# Patient Record
Sex: Female | Born: 1975 | Race: White | Hispanic: No | Marital: Married | Smoking: Never smoker
Health system: Southern US, Community
[De-identification: ages and names within clinical notes are randomized; demographics above are authoritative.]

## PROBLEM LIST (undated history)

## (undated) DIAGNOSIS — E039 Hypothyroidism, unspecified: Secondary | ICD-10-CM

## (undated) DIAGNOSIS — T7840XA Allergy, unspecified, initial encounter: Secondary | ICD-10-CM

## (undated) DIAGNOSIS — N12 Tubulo-interstitial nephritis, not specified as acute or chronic: Secondary | ICD-10-CM

## (undated) DIAGNOSIS — R945 Abnormal results of liver function studies: Secondary | ICD-10-CM

## (undated) HISTORY — DX: Tubulo-interstitial nephritis, not specified as acute or chronic: N12

## (undated) HISTORY — DX: Abnormal results of liver function studies: R94.5

## (undated) HISTORY — DX: Allergy, unspecified, initial encounter: T78.40XA

## (undated) HISTORY — DX: Hypothyroidism, unspecified: E03.9

---

## 1998-05-24 ENCOUNTER — Other Ambulatory Visit: Admission: RE | Admit: 1998-05-24 | Discharge: 1998-05-24 | Payer: Self-pay | Admitting: Family Medicine

## 1998-09-19 ENCOUNTER — Other Ambulatory Visit: Admission: RE | Admit: 1998-09-19 | Discharge: 1998-09-19 | Payer: Self-pay | Admitting: Family Medicine

## 1998-11-02 ENCOUNTER — Other Ambulatory Visit: Admission: RE | Admit: 1998-11-02 | Discharge: 1998-11-02 | Payer: Self-pay | Admitting: Obstetrics and Gynecology

## 1999-02-06 ENCOUNTER — Other Ambulatory Visit: Admission: RE | Admit: 1999-02-06 | Discharge: 1999-02-06 | Payer: Self-pay | Admitting: Family Medicine

## 1999-07-03 ENCOUNTER — Other Ambulatory Visit: Admission: RE | Admit: 1999-07-03 | Discharge: 1999-07-03 | Payer: Self-pay | Admitting: Family Medicine

## 1999-07-21 ENCOUNTER — Encounter: Payer: Self-pay | Admitting: Emergency Medicine

## 1999-07-21 ENCOUNTER — Emergency Department (HOSPITAL_COMMUNITY): Admission: EM | Admit: 1999-07-21 | Discharge: 1999-07-21 | Payer: Self-pay | Admitting: Emergency Medicine

## 2000-02-14 ENCOUNTER — Other Ambulatory Visit: Admission: RE | Admit: 2000-02-14 | Discharge: 2000-02-14 | Payer: Self-pay | Admitting: Family Medicine

## 2000-05-31 ENCOUNTER — Emergency Department (HOSPITAL_COMMUNITY): Admission: EM | Admit: 2000-05-31 | Discharge: 2000-05-31 | Payer: Self-pay | Admitting: Emergency Medicine

## 2001-03-24 ENCOUNTER — Other Ambulatory Visit: Admission: RE | Admit: 2001-03-24 | Discharge: 2001-03-24 | Payer: Self-pay | Admitting: Obstetrics and Gynecology

## 2001-05-13 ENCOUNTER — Encounter: Payer: Self-pay | Admitting: Obstetrics and Gynecology

## 2001-05-13 ENCOUNTER — Ambulatory Visit (HOSPITAL_COMMUNITY): Admission: RE | Admit: 2001-05-13 | Discharge: 2001-05-13 | Payer: Self-pay | Admitting: Obstetrics and Gynecology

## 2001-06-17 ENCOUNTER — Ambulatory Visit (HOSPITAL_COMMUNITY): Admission: RE | Admit: 2001-06-17 | Discharge: 2001-06-17 | Payer: Self-pay | Admitting: Obstetrics and Gynecology

## 2001-06-17 ENCOUNTER — Encounter: Payer: Self-pay | Admitting: Obstetrics and Gynecology

## 2001-08-19 ENCOUNTER — Ambulatory Visit (HOSPITAL_COMMUNITY): Admission: RE | Admit: 2001-08-19 | Discharge: 2001-08-19 | Payer: Self-pay | Admitting: Obstetrics and Gynecology

## 2001-08-19 ENCOUNTER — Encounter: Payer: Self-pay | Admitting: Obstetrics and Gynecology

## 2001-08-19 ENCOUNTER — Inpatient Hospital Stay (HOSPITAL_COMMUNITY): Admission: AD | Admit: 2001-08-19 | Discharge: 2001-08-19 | Payer: Self-pay | Admitting: Obstetrics and Gynecology

## 2001-09-04 ENCOUNTER — Inpatient Hospital Stay (HOSPITAL_COMMUNITY): Admission: AD | Admit: 2001-09-04 | Discharge: 2001-09-06 | Payer: Self-pay | Admitting: Obstetrics and Gynecology

## 2001-09-04 ENCOUNTER — Encounter (INDEPENDENT_AMBULATORY_CARE_PROVIDER_SITE_OTHER): Payer: Self-pay | Admitting: Specialist

## 2001-09-07 ENCOUNTER — Encounter: Admission: RE | Admit: 2001-09-07 | Discharge: 2001-10-07 | Payer: Self-pay | Admitting: Obstetrics and Gynecology

## 2002-03-28 ENCOUNTER — Other Ambulatory Visit: Admission: RE | Admit: 2002-03-28 | Discharge: 2002-03-28 | Payer: Self-pay | Admitting: Obstetrics and Gynecology

## 2002-04-01 ENCOUNTER — Encounter: Payer: Self-pay | Admitting: Family Medicine

## 2002-04-01 ENCOUNTER — Ambulatory Visit (HOSPITAL_COMMUNITY): Admission: RE | Admit: 2002-04-01 | Discharge: 2002-04-01 | Payer: Self-pay | Admitting: Nurse Practitioner

## 2002-11-29 ENCOUNTER — Ambulatory Visit (HOSPITAL_COMMUNITY): Admission: RE | Admit: 2002-11-29 | Discharge: 2002-11-29 | Payer: Self-pay | Admitting: Family Medicine

## 2002-11-29 ENCOUNTER — Encounter: Payer: Self-pay | Admitting: Family Medicine

## 2003-04-12 ENCOUNTER — Other Ambulatory Visit: Admission: RE | Admit: 2003-04-12 | Discharge: 2003-04-12 | Payer: Self-pay | Admitting: Obstetrics and Gynecology

## 2003-04-12 ENCOUNTER — Other Ambulatory Visit: Admission: RE | Admit: 2003-04-12 | Discharge: 2003-04-12 | Payer: Self-pay | Admitting: Internal Medicine

## 2004-05-22 ENCOUNTER — Ambulatory Visit: Payer: Self-pay | Admitting: Endocrinology

## 2004-06-18 ENCOUNTER — Other Ambulatory Visit: Admission: RE | Admit: 2004-06-18 | Discharge: 2004-06-18 | Payer: Self-pay | Admitting: Obstetrics and Gynecology

## 2004-08-14 ENCOUNTER — Encounter: Admission: RE | Admit: 2004-08-14 | Discharge: 2004-08-14 | Payer: Self-pay | Admitting: Obstetrics and Gynecology

## 2005-08-09 ENCOUNTER — Inpatient Hospital Stay (HOSPITAL_COMMUNITY): Admission: AD | Admit: 2005-08-09 | Discharge: 2005-08-09 | Payer: Self-pay | Admitting: Obstetrics and Gynecology

## 2005-08-09 IMAGING — US US OB TRANSVAGINAL MODIFY
1 series · 14 of 25 positions shown · non-contrast
Comparison: none

HISTORY: Early pregnancy, vaginal bleeding

[Series 1: us ob transvaginal modify · 0.39mm/px · 14 of 25 slices shown]
[im 1/25]
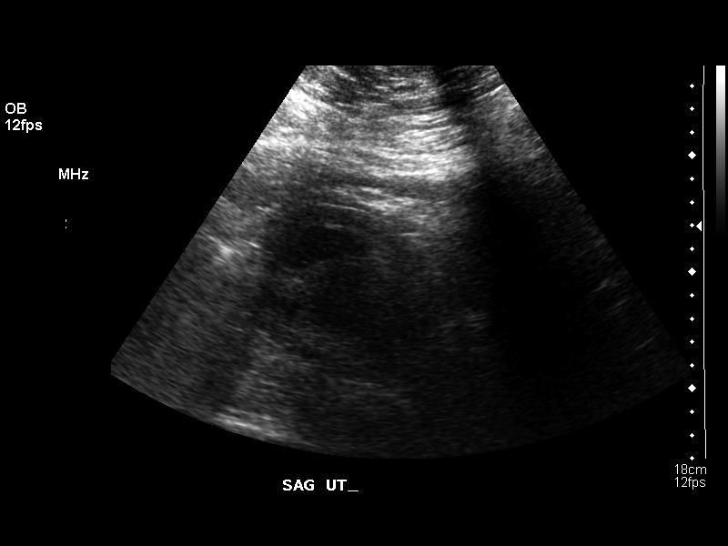
[im 3/25]
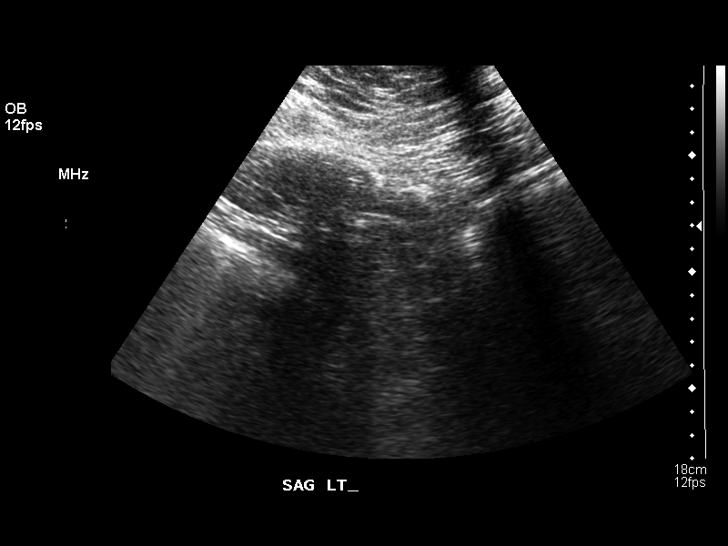
[im 5/25]
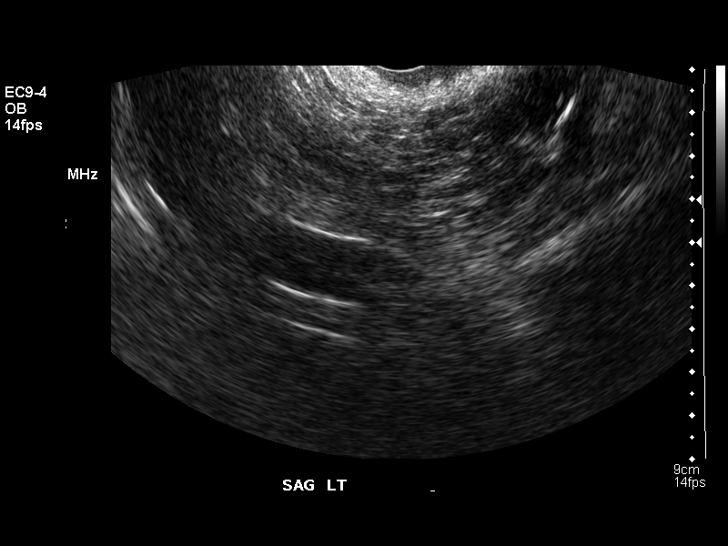
[im 7/25]
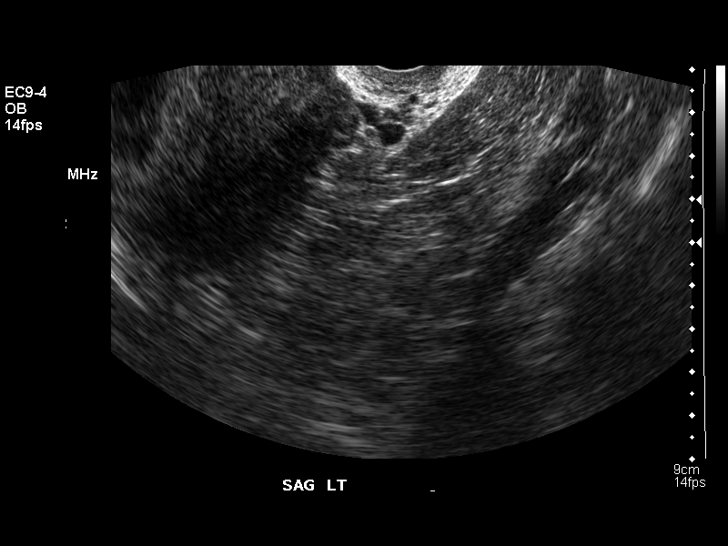
[im 9/25]
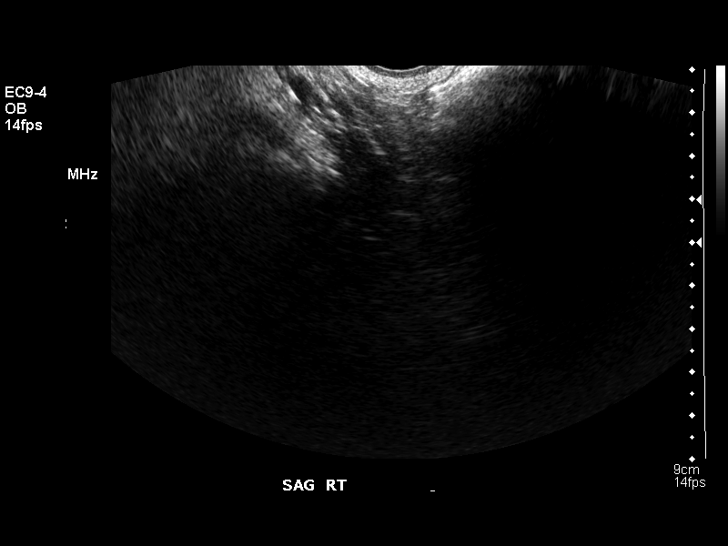
[im 10/25]
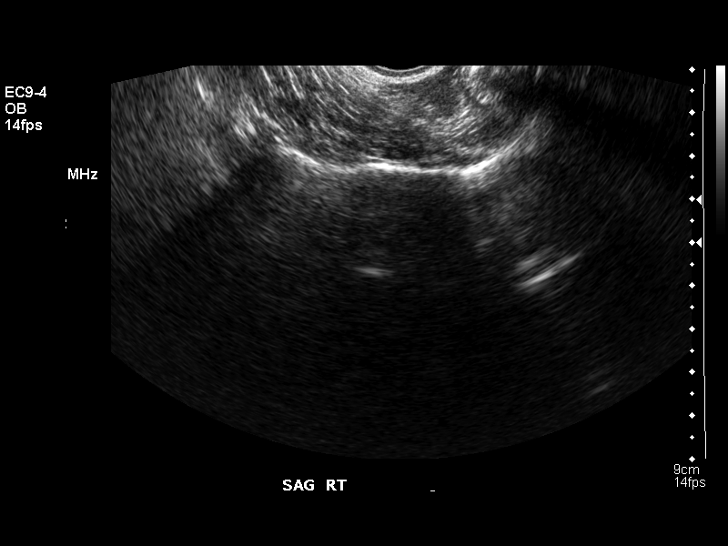
[im 12/25]
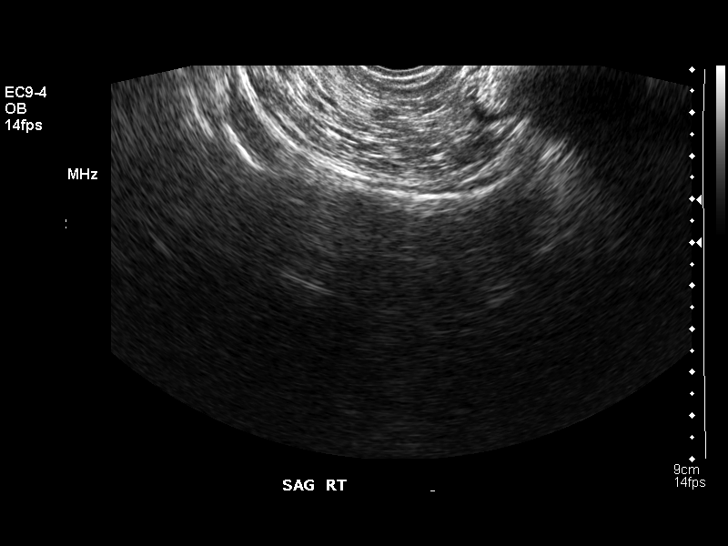
[im 14/25]
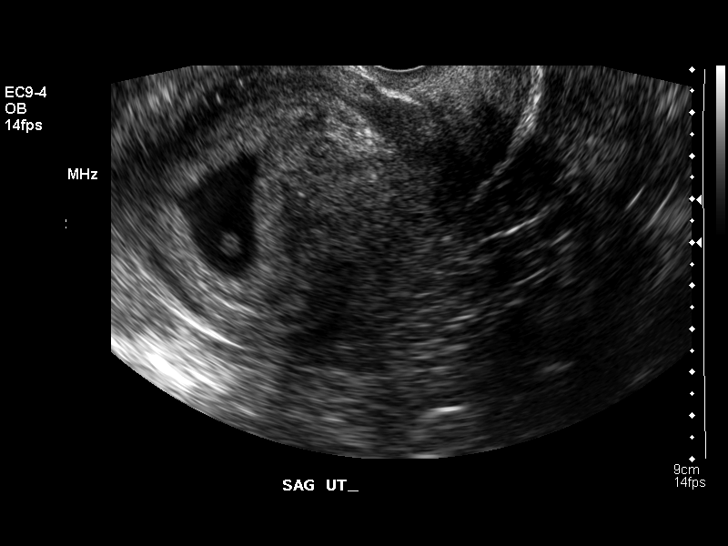
[im 16/25]
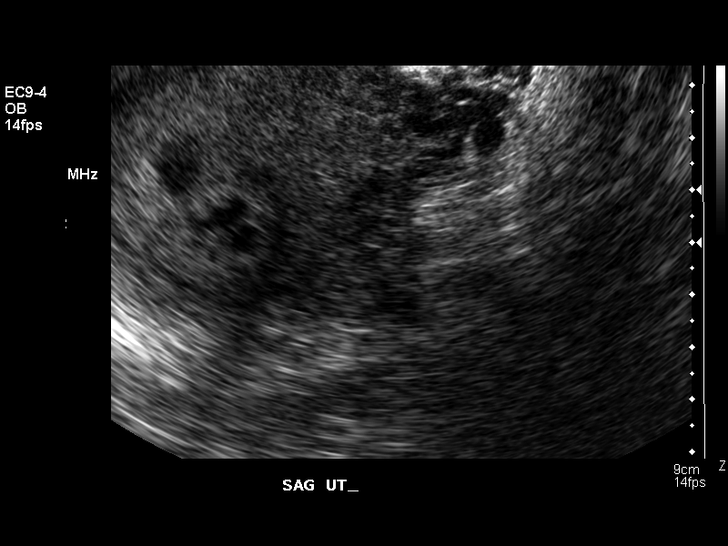
[im 17/25]
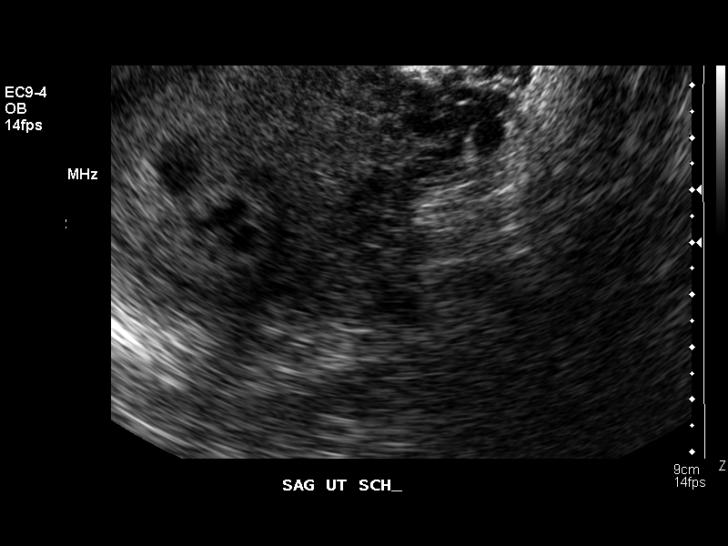
[im 19/25]
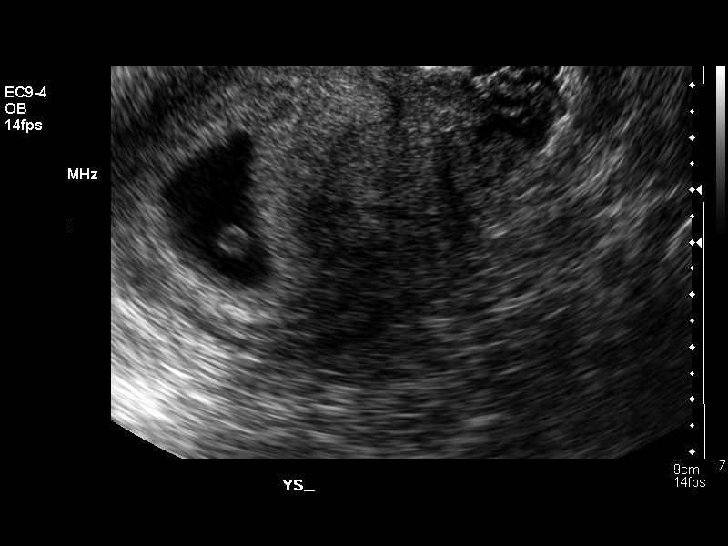
[im 21/25]
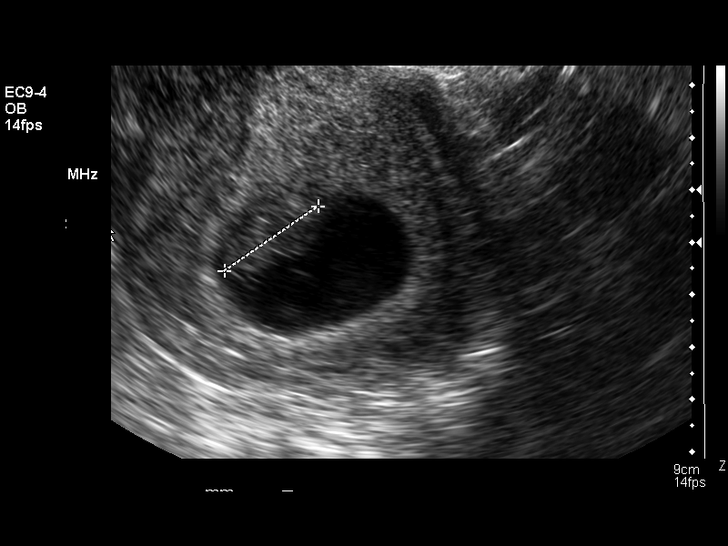
[im 23/25]
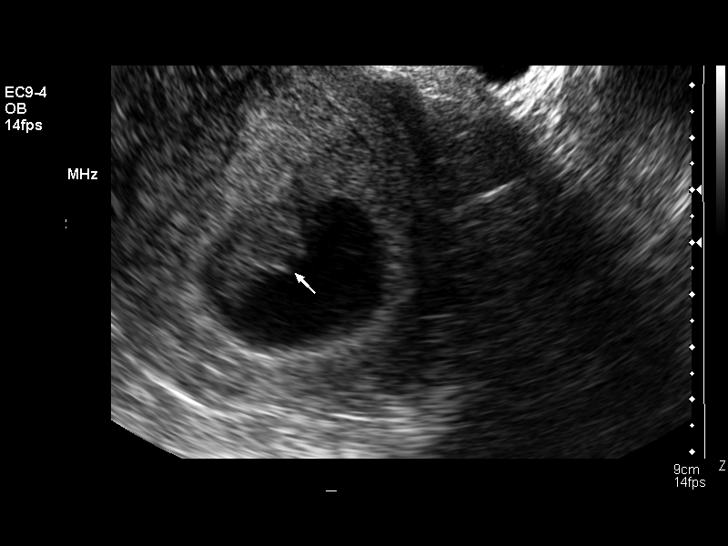
[im 25/25]
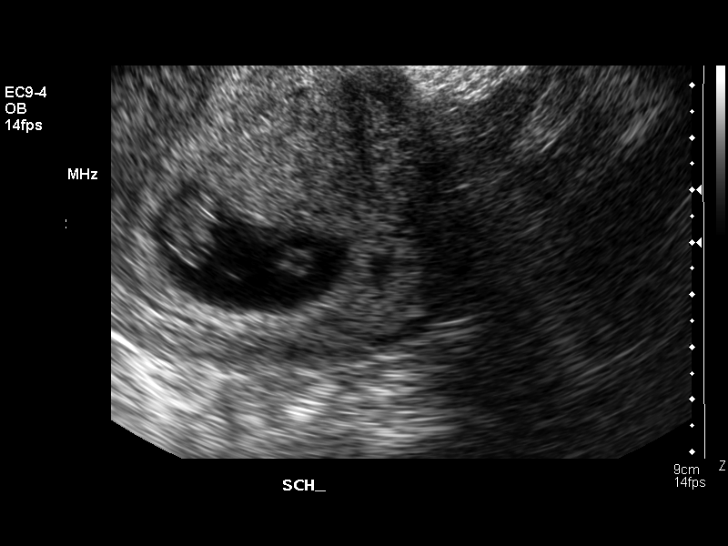

[14 of 25 positions shown; findings below may reference images not displayed]

ULTRASOUND OB COMPLETE LESS THAN 14 WEEKS:
ULTRASOUND OB TRANSVAGINAL MODIFY:

Transabdominal and endovaginal sonography of the gravid pelvis performed.

Transabdominal imaging limited by lack of adequate bladder distention and poor
acoustic window.
Gestational sac identified within uterus, containing small fetus.
Crown-rump length 2.2 cm corresponding to 8 weeks 6 days estimated gestational
age.
Small subchorionic hemorrhage identified.
Fetal cardiac activity detected at 160 beats per minute.
Yolk sac identified.
Ovaries not visualized.
No free pelvic fluid.
IMPRESSION: Early single live intrauterine gestation measured at 8 weeks 6 days estimated
gestational age.
Small subchorionic hemorrhage.

## 2005-09-03 ENCOUNTER — Other Ambulatory Visit: Admission: RE | Admit: 2005-09-03 | Discharge: 2005-09-03 | Payer: Self-pay | Admitting: Obstetrics and Gynecology

## 2006-02-25 ENCOUNTER — Inpatient Hospital Stay (HOSPITAL_COMMUNITY): Admission: AD | Admit: 2006-02-25 | Discharge: 2006-02-27 | Payer: Self-pay | Admitting: Obstetrics and Gynecology

## 2006-02-25 ENCOUNTER — Encounter (INDEPENDENT_AMBULATORY_CARE_PROVIDER_SITE_OTHER): Payer: Self-pay | Admitting: Specialist

## 2006-02-28 ENCOUNTER — Encounter: Admission: RE | Admit: 2006-02-28 | Discharge: 2006-03-29 | Payer: Self-pay | Admitting: Obstetrics and Gynecology

## 2006-03-30 ENCOUNTER — Encounter: Admission: RE | Admit: 2006-03-30 | Discharge: 2006-04-29 | Payer: Self-pay | Admitting: Obstetrics and Gynecology

## 2006-12-20 ENCOUNTER — Emergency Department (HOSPITAL_COMMUNITY): Admission: EM | Admit: 2006-12-20 | Discharge: 2006-12-21 | Payer: Self-pay | Admitting: Emergency Medicine

## 2006-12-23 ENCOUNTER — Emergency Department (HOSPITAL_COMMUNITY): Admission: EM | Admit: 2006-12-23 | Discharge: 2006-12-23 | Payer: Self-pay | Admitting: Emergency Medicine

## 2006-12-23 IMAGING — CT CT HEAD W/O CM
1 series · 16 of 30 positions shown, 20 images · IV contrast (agent unspecified)
Comparison: None.

CLINICAL DATA: Headache/fever.  
 HEAD CT WITHOUT CONTRAST:
TECHNIQUE: Contiguous axial images were obtained from the base of the skull through the vertex according to standard protocol without contrast.

[Series 2: headseq 4.8 h37s · axial · 0.44mm/px · z∈[+72,+232]mm · 16 of 36 slices shown, 20 images]
[im 2/36  brain]
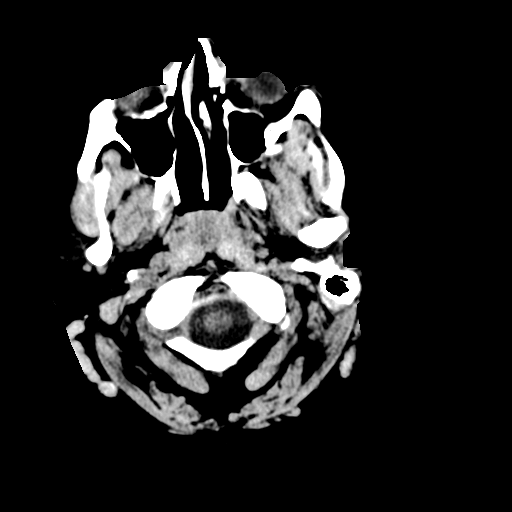
[im 2/36  bone]
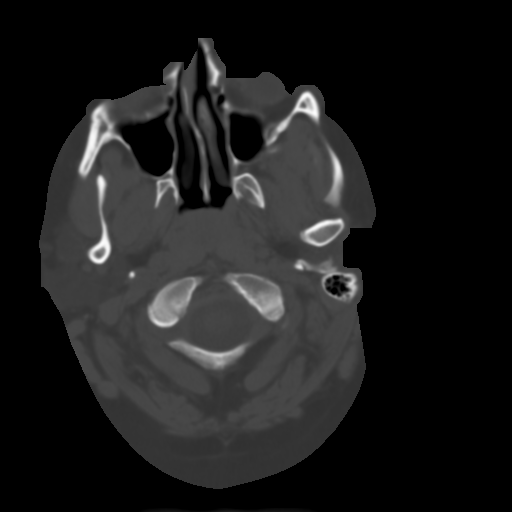
[im 4/36  brain]
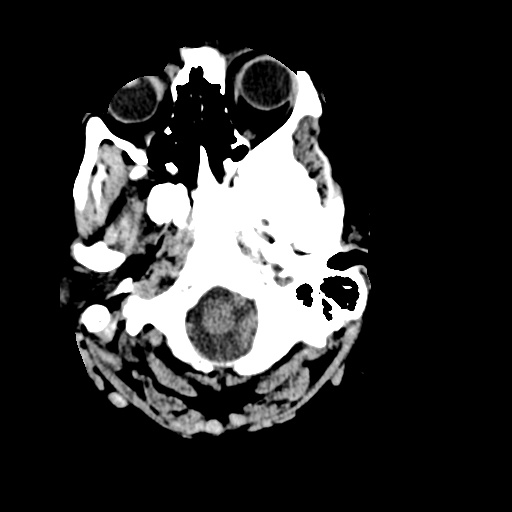
[im 7/36  brain]
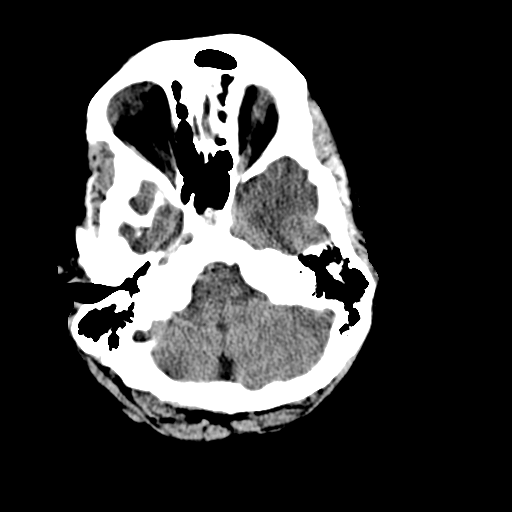
[im 9/36  brain]
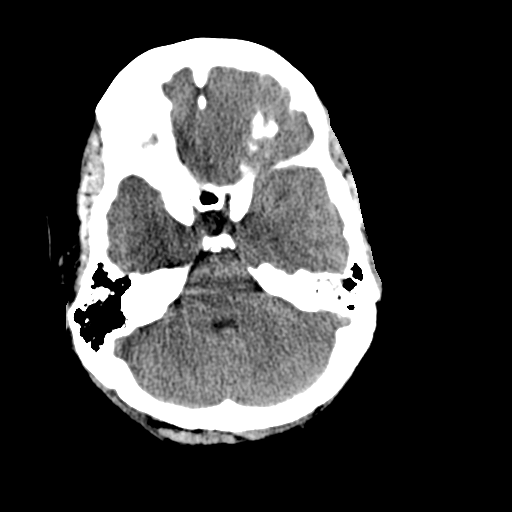
[im 10/36  brain]
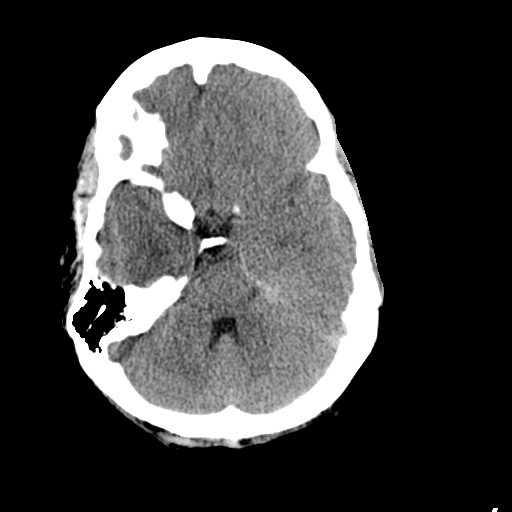
[im 10/36  bone]
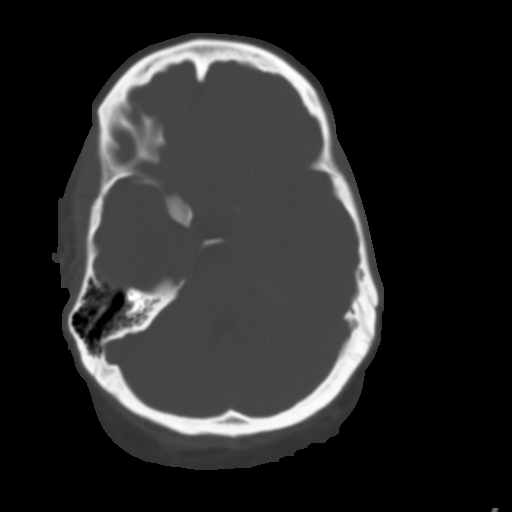
[im 13/36  brain]
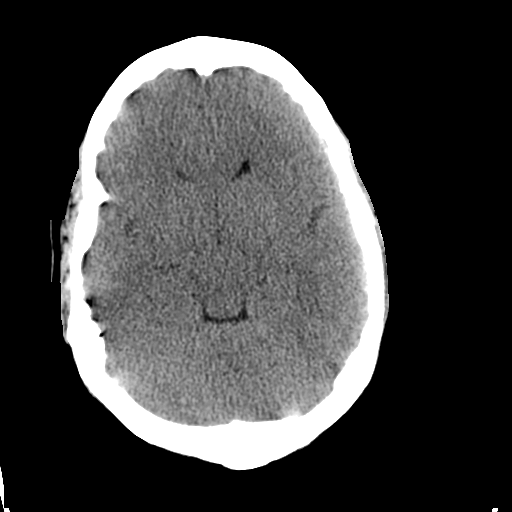
[im 15/36  brain]
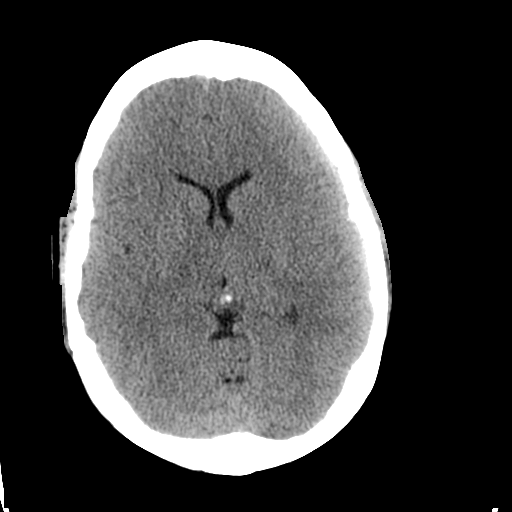
[im 17/36  brain]
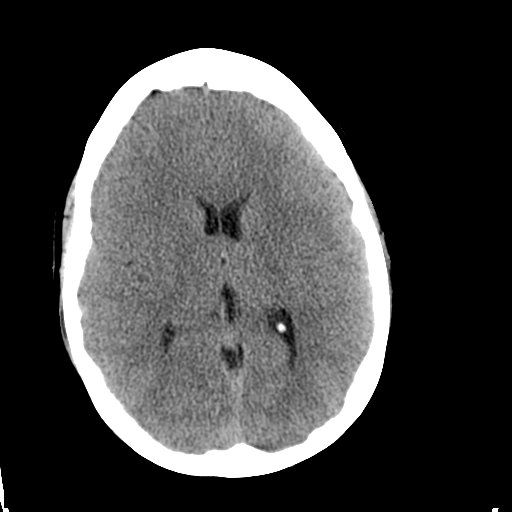
[im 19/36  brain]
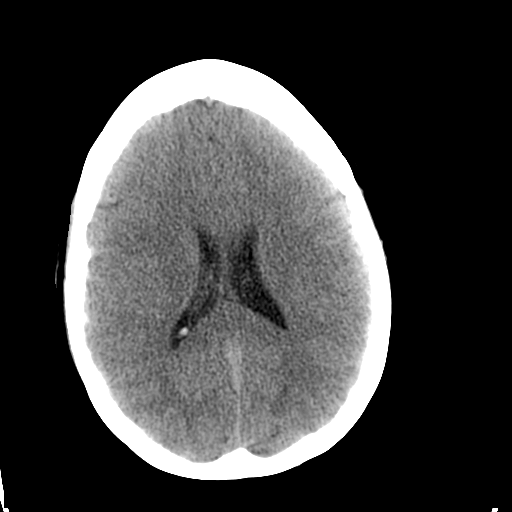
[im 19/36  bone]
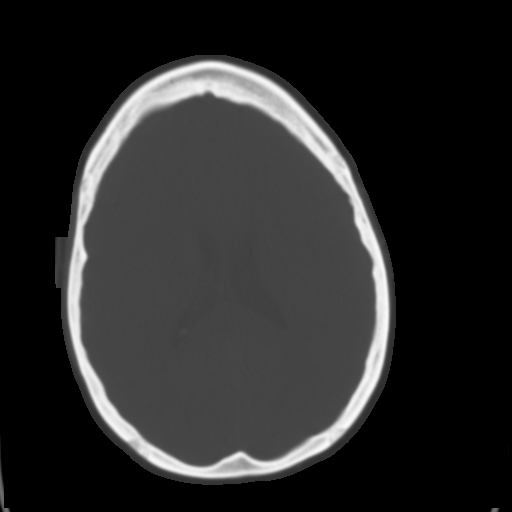
[im 21/36  brain]
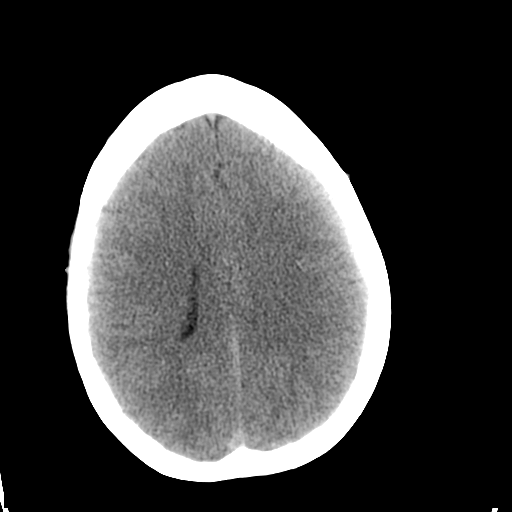
[im 23/36  brain]
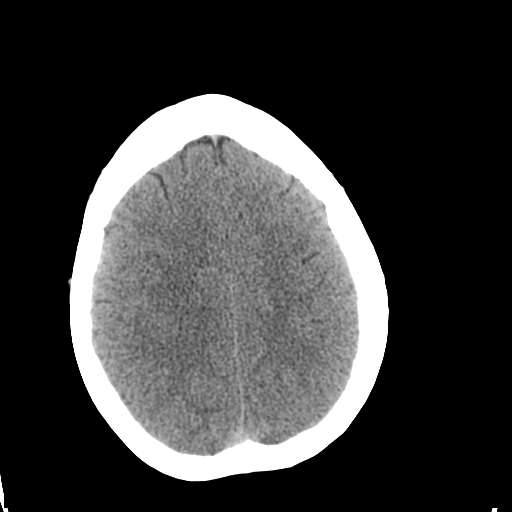
[im 26/36  brain]
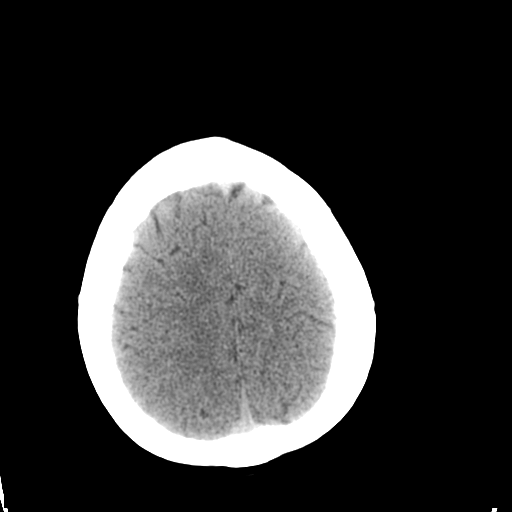
[im 27/36  brain]
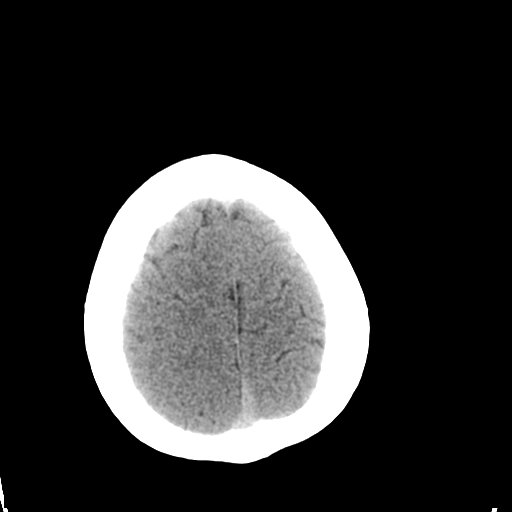
[im 27/36  bone]
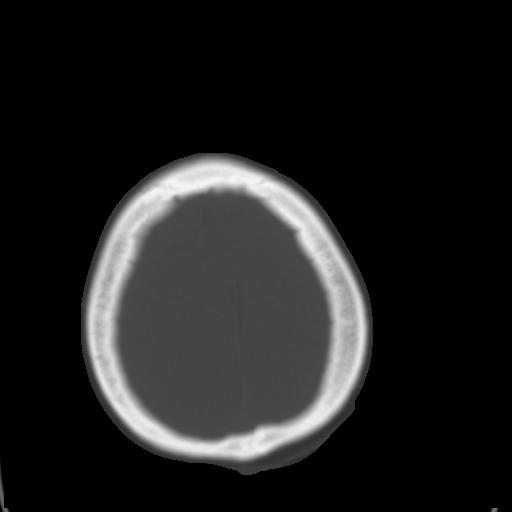
[im 29/36  brain]
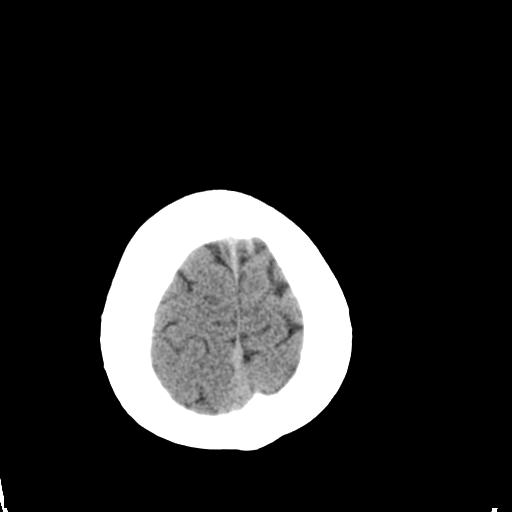
[im 32/36  brain]
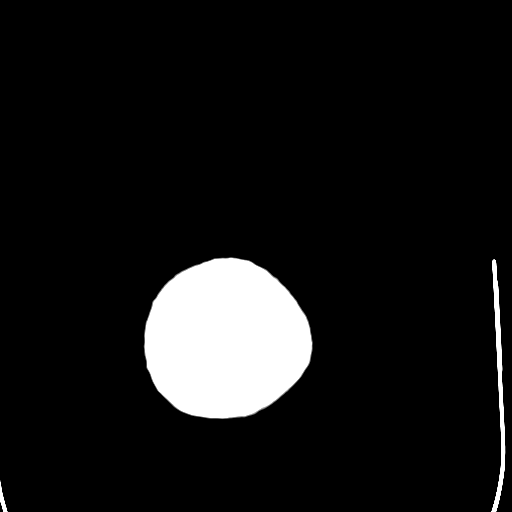
[im 34/36  brain]
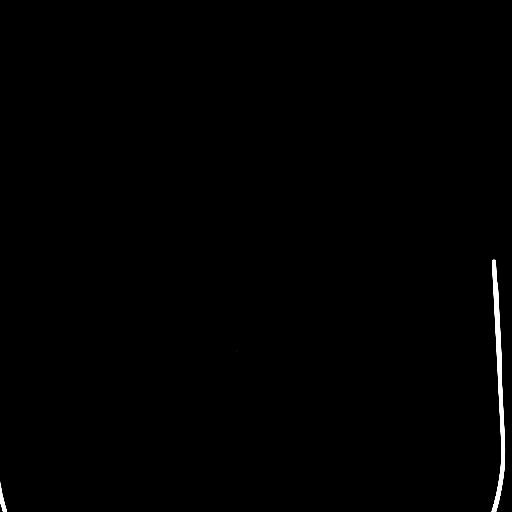

[16 of 30 positions shown; findings below may reference images not displayed]

FINDINGS: There is no evidence of intracranial hemorrhage, brain edema, acute infarct, mass lesion, or mass effect.  No other intra-axial abnormalities are seen, and the ventricles are within normal limits.  No abnormal extraaxial fluid collections or masses are identified.  No skull abnormalities are noted.
IMPRESSION: Negative non-contrast head CT.

## 2007-03-27 IMAGING — CR DG CHEST 2V
2 series · 2 of 2 positions shown · non-contrast
Comparison: none

CLINICAL DATA: Fever and cough.
 CHEST - 2 VIEW:

[view not recorded (1 of 2)]
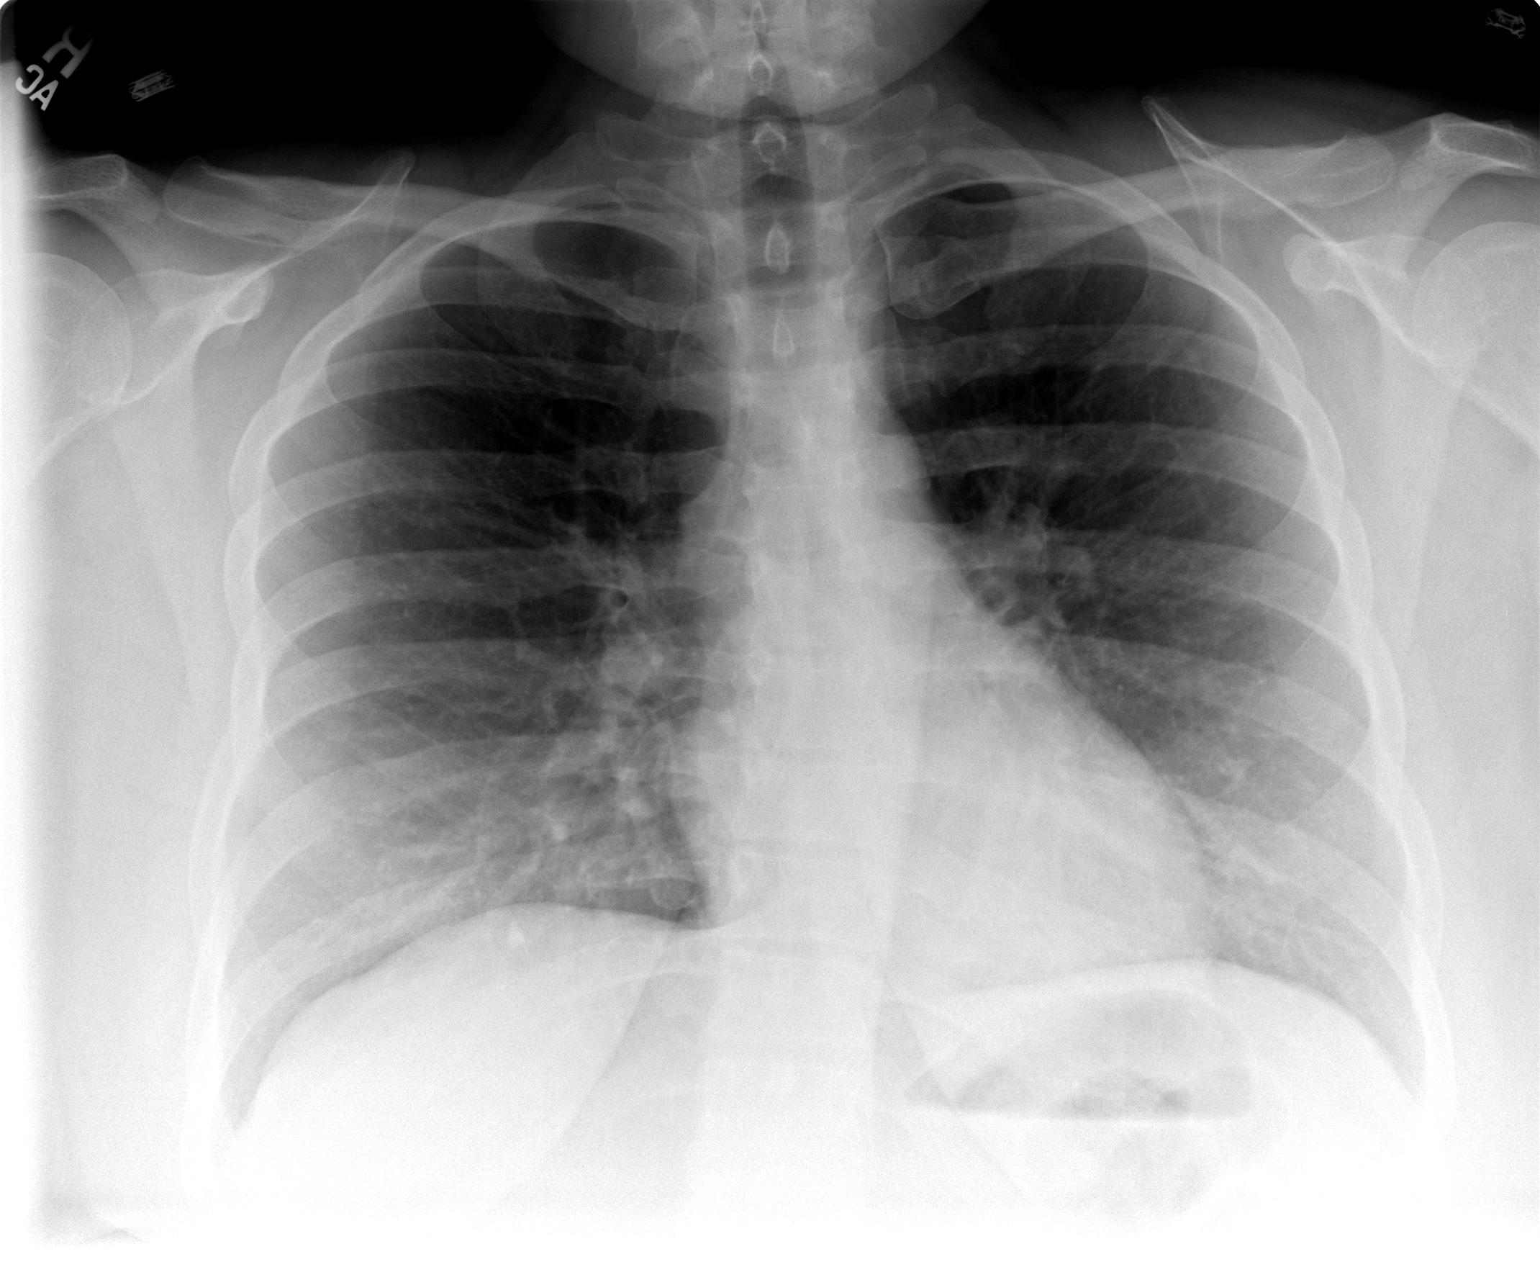

[view not recorded (2 of 2)]
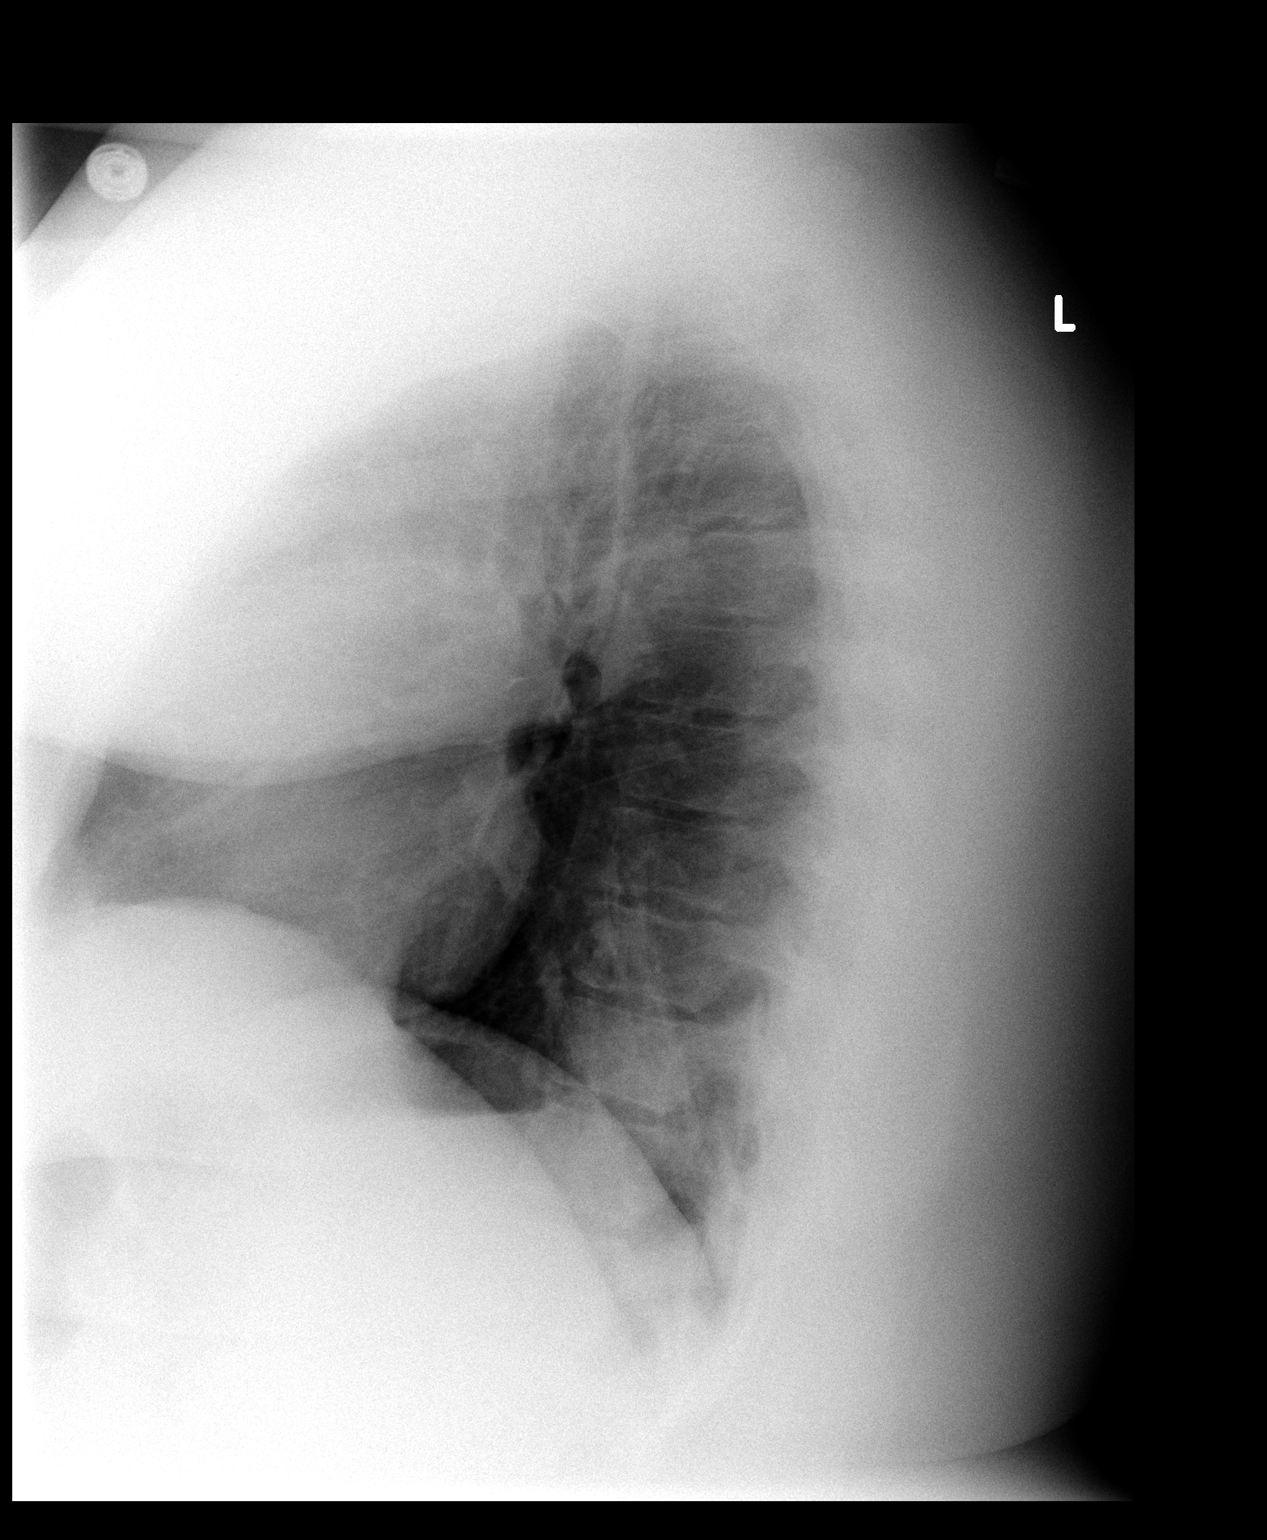

[2 of 2 positions shown; findings below may reference images not displayed]

FINDINGS: The lungs are clear.  Heart size normal.  No pleural effusion or focal bony abnormality.
IMPRESSION: No acute disease.

## 2007-09-14 ENCOUNTER — Ambulatory Visit (HOSPITAL_COMMUNITY): Admission: RE | Admit: 2007-09-14 | Discharge: 2007-09-14 | Payer: Self-pay | Admitting: Obstetrics and Gynecology

## 2007-09-14 IMAGING — US US OB DETAIL+14 WK
1 series · 14 of 28 positions shown · non-contrast
Comparison: none

OBSTETRICAL ULTRASOUND:

 This ultrasound exam was performed in the [HOSPITAL] Ultrasound Department.  The OB US report was generated in the AS system, and faxed to the ordering physician.  This report is also available in [REDACTED] PACS.

[Series 1: us ob detail+14 wk · 0.27mm/px · 14 of 68 slices shown]
[im 3/68]
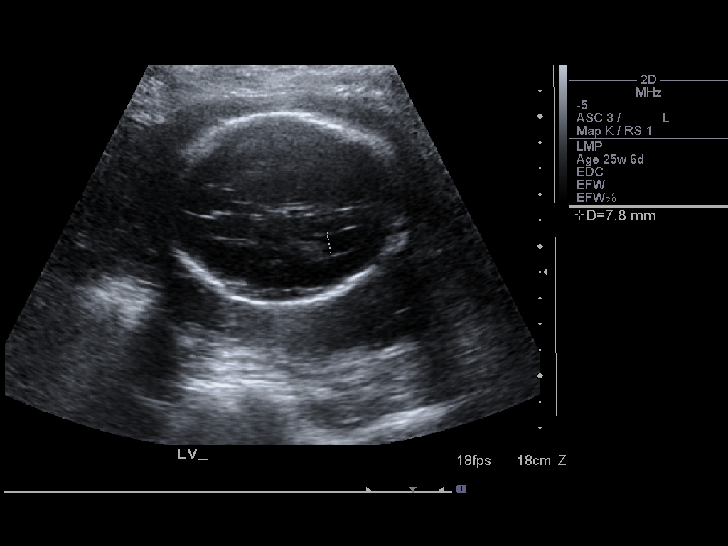
[im 8/68]
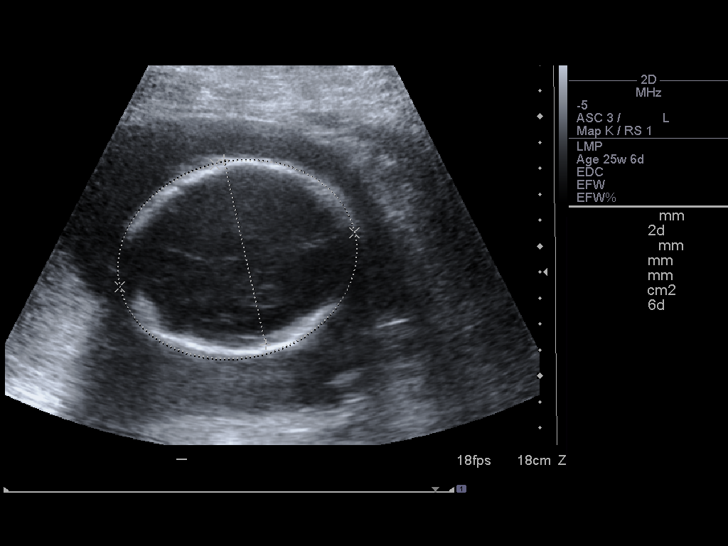
[im 13/68]
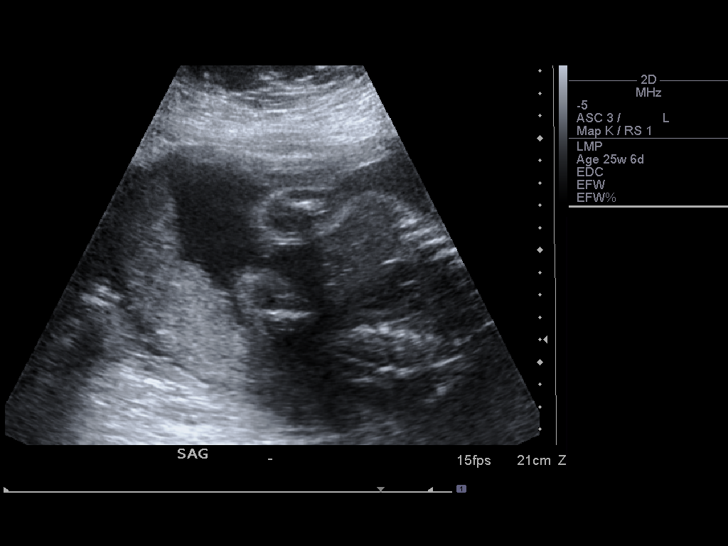
[im 18/68]
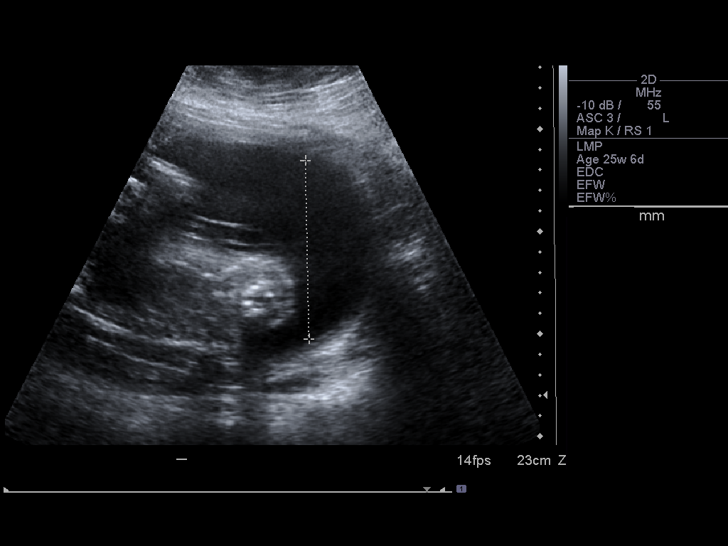
[im 23/68]
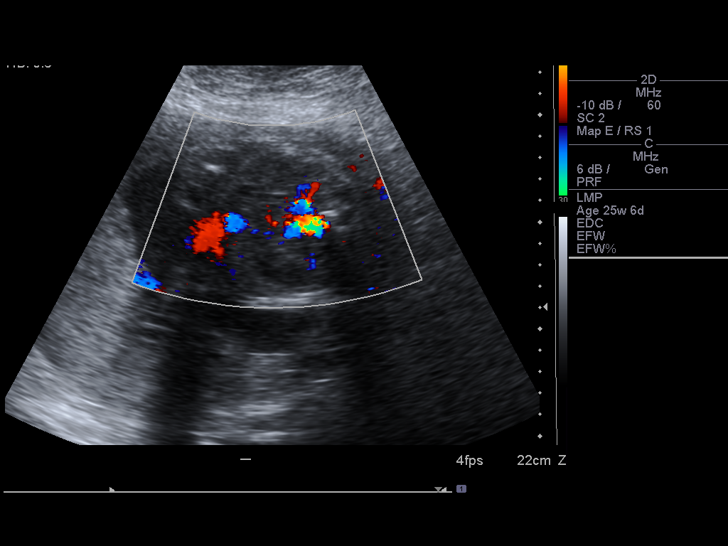
[im 28/68]
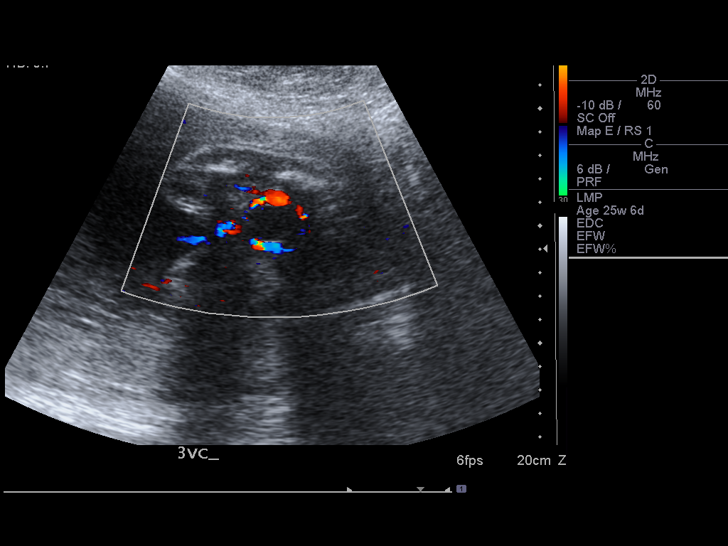
[im 33/68]
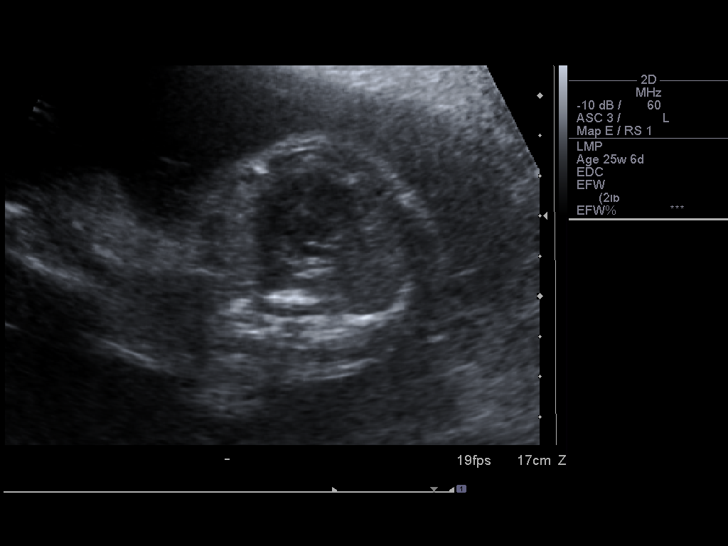
[im 38/68]
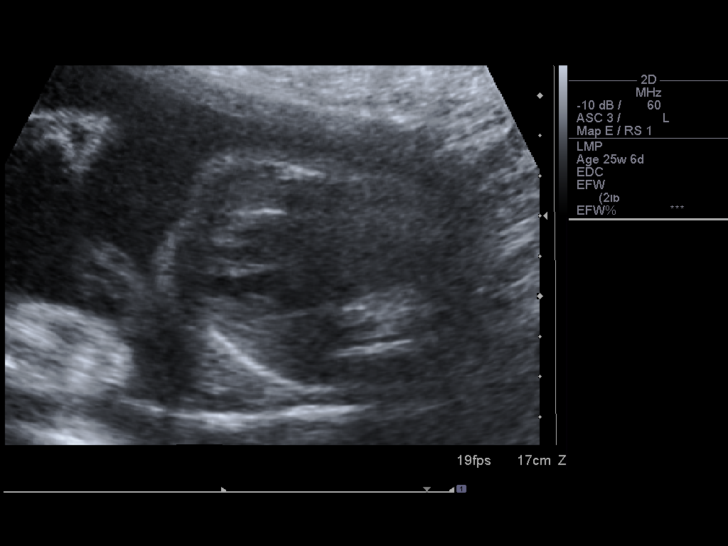
[im 43/68]
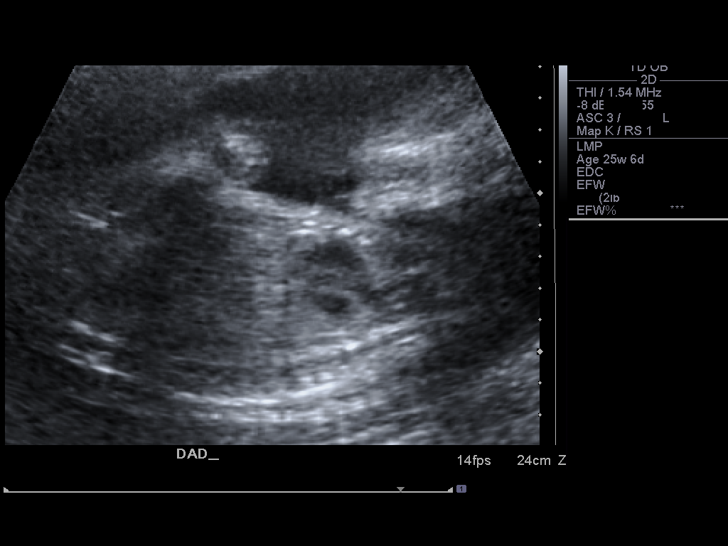
[im 48/68]
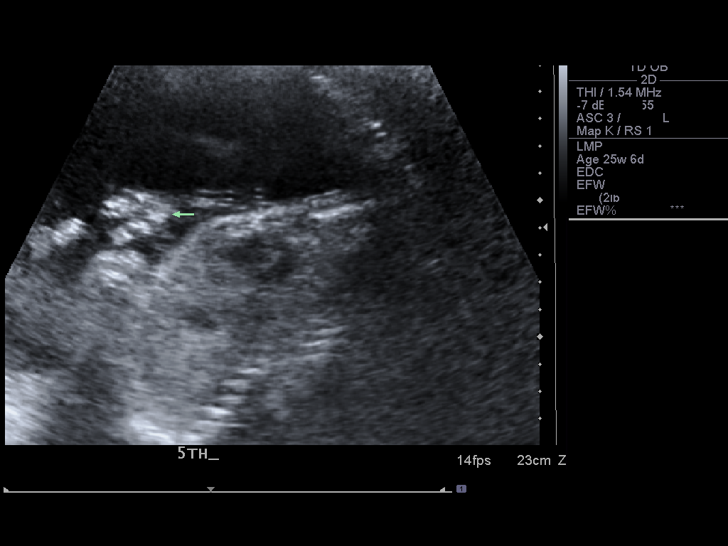
[im 53/68]
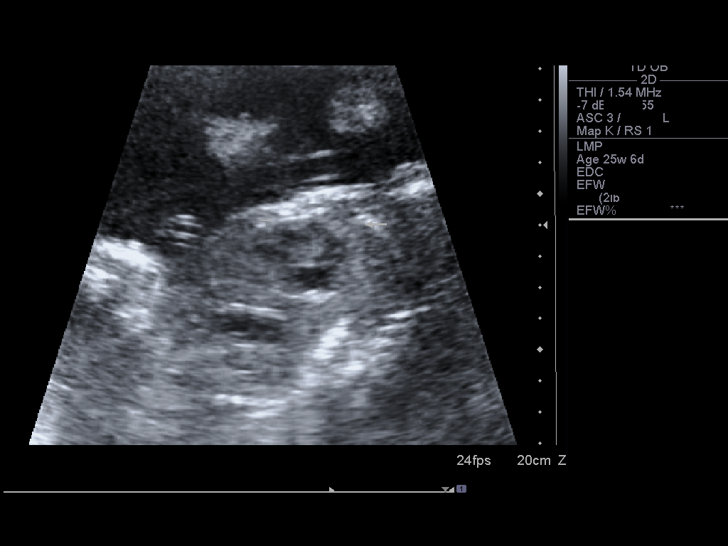
[im 58/68]
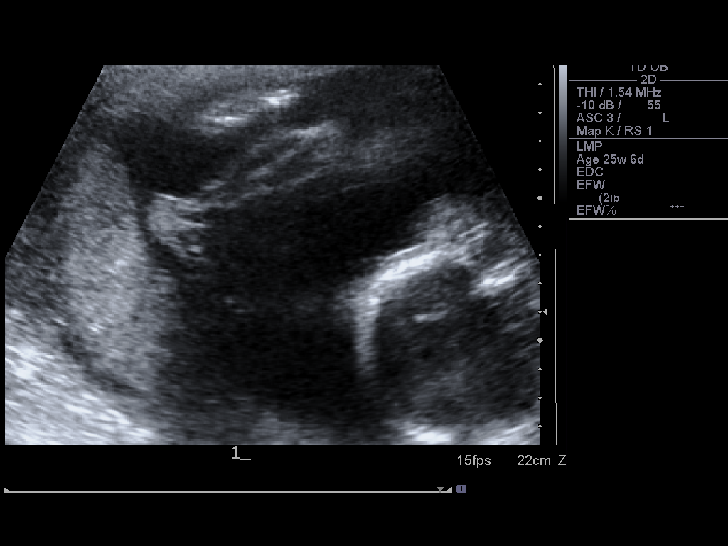
[im 63/68]
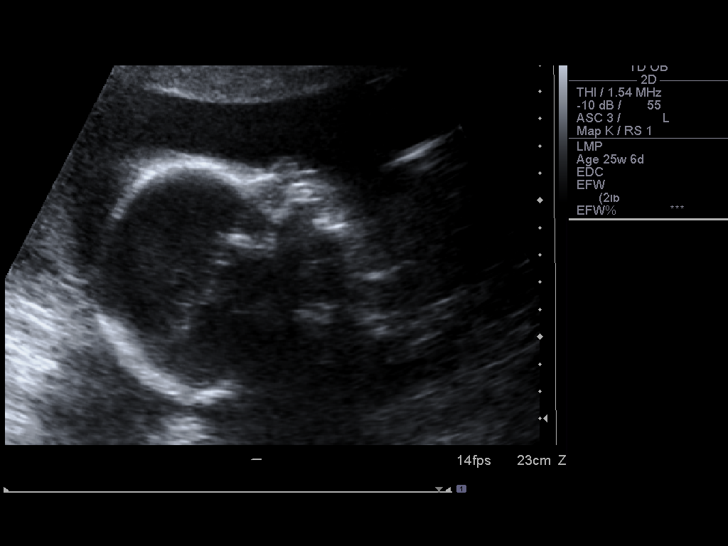
[im 68/68]
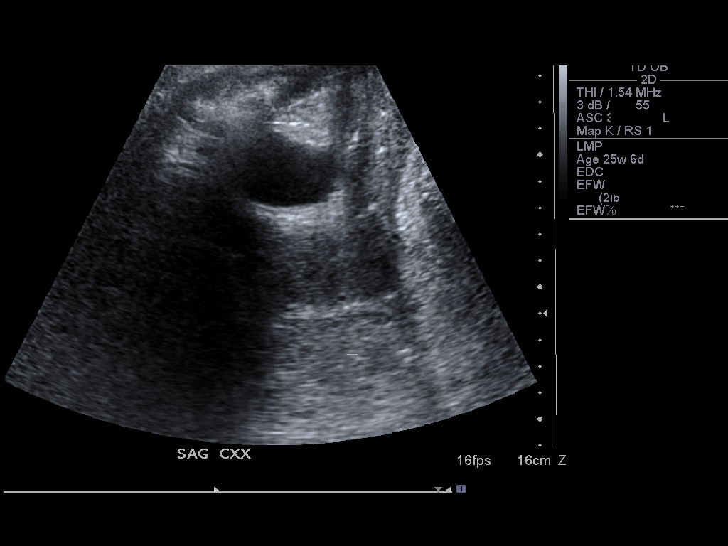

[14 of 28 positions shown; findings below may reference images not displayed]

IMPRESSION: See AS Obstetric US report.

## 2007-10-20 ENCOUNTER — Ambulatory Visit (HOSPITAL_COMMUNITY): Admission: RE | Admit: 2007-10-20 | Discharge: 2007-10-20 | Payer: Self-pay | Admitting: Obstetrics and Gynecology

## 2007-10-20 IMAGING — US US OB FOLLOW-UP
1 series · 14 of 21 positions shown · non-contrast
Comparison: none

OBSTETRICAL ULTRASOUND:
 This ultrasound exam was performed in the [HOSPITAL] Ultrasound Department.  The OB US report was generated in the AS system, and faxed to the ordering physician.  This report is also available in [REDACTED] PACS.

[Series 1: us ob follow-up · 0.37mm/px · 14 of 21 slices shown]
[im 1/21]
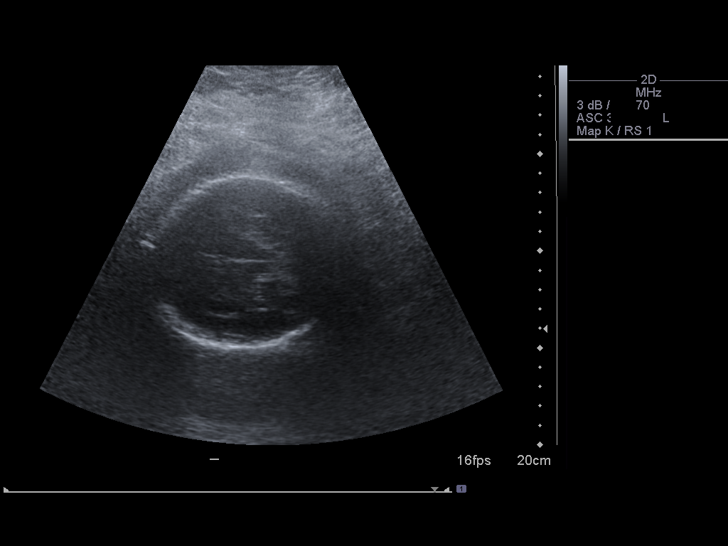
[im 3/21]
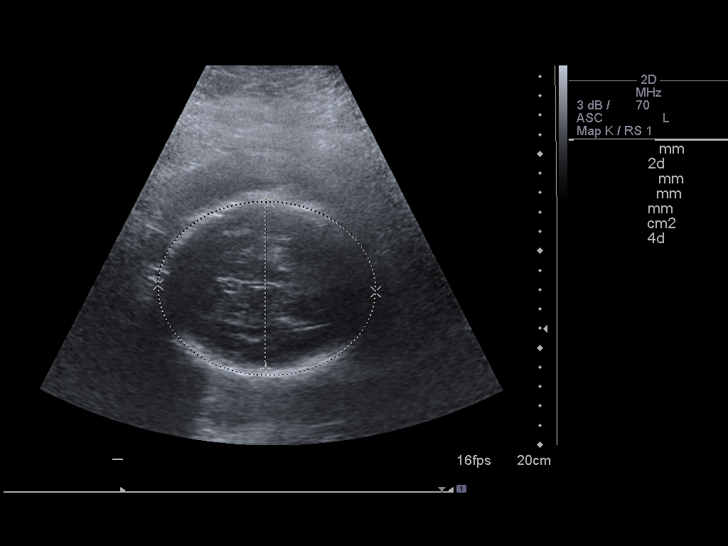
[im 4/21]
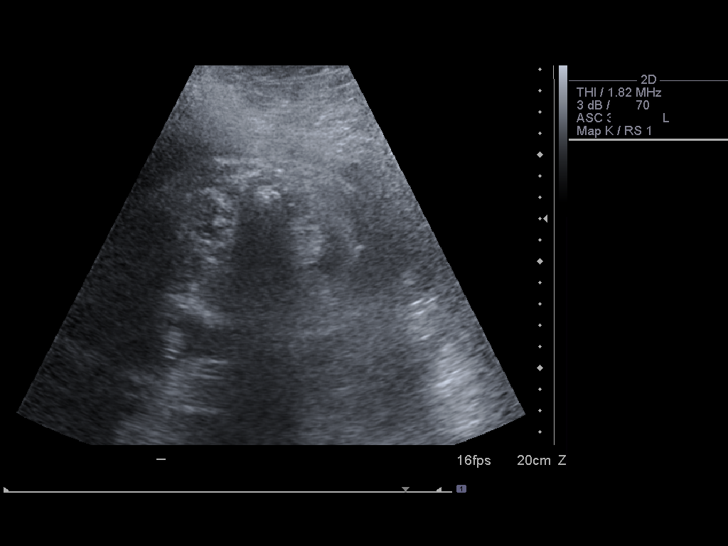
[im 6/21]
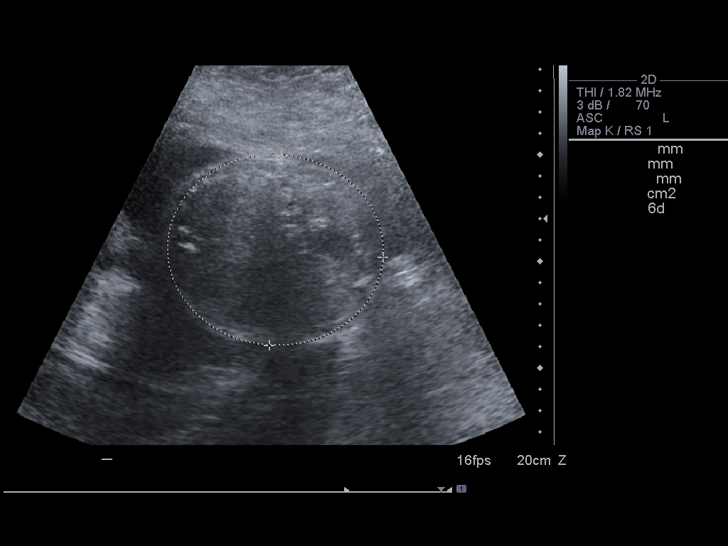
[im 7/21]
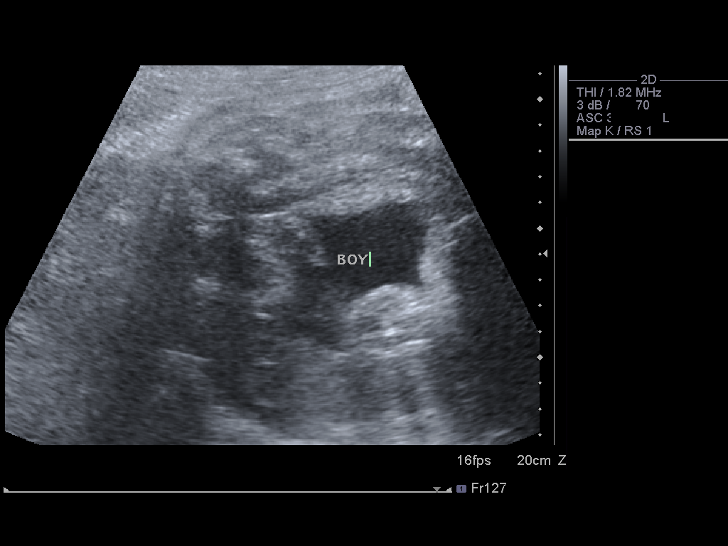
[im 9/21]
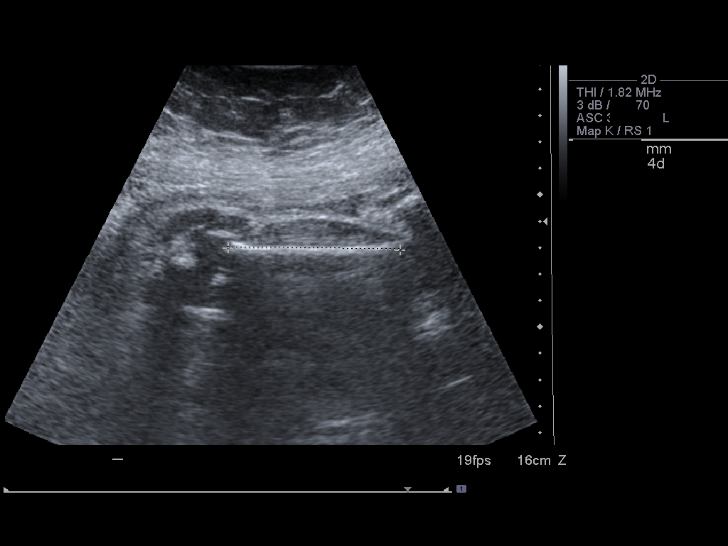
[im 10/21]
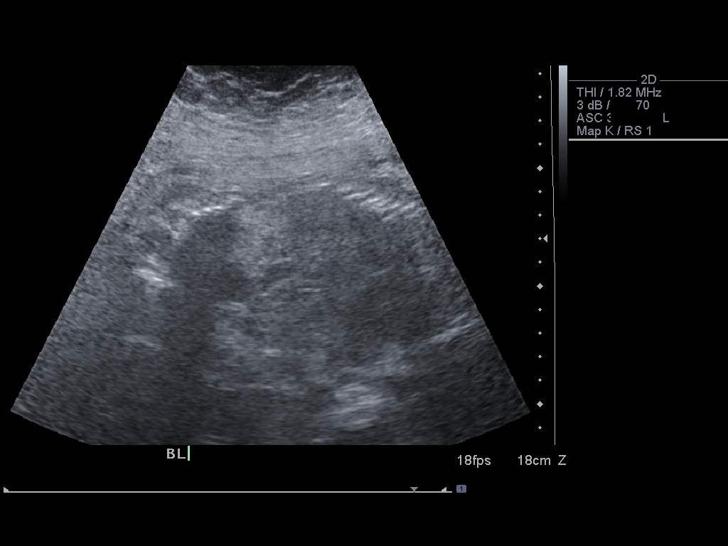
[im 12/21]
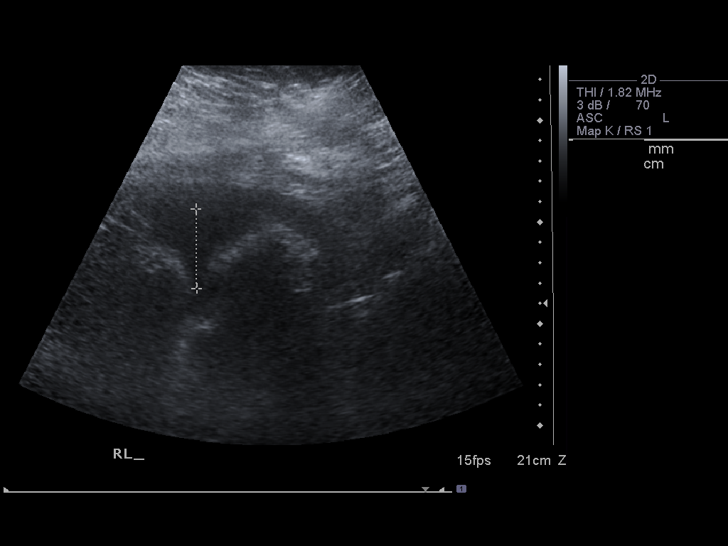
[im 13/21]
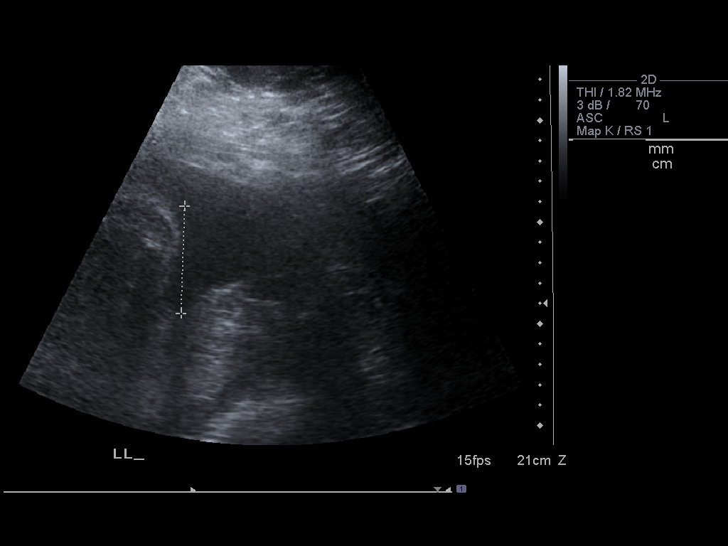
[im 15/21]
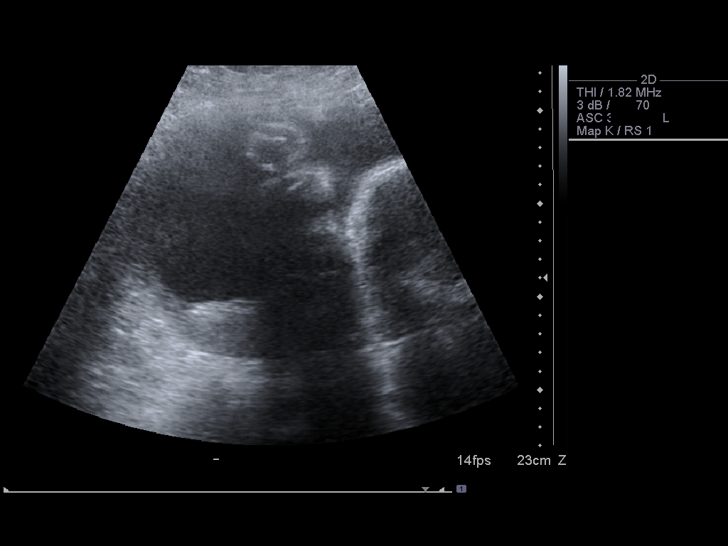
[im 16/21]
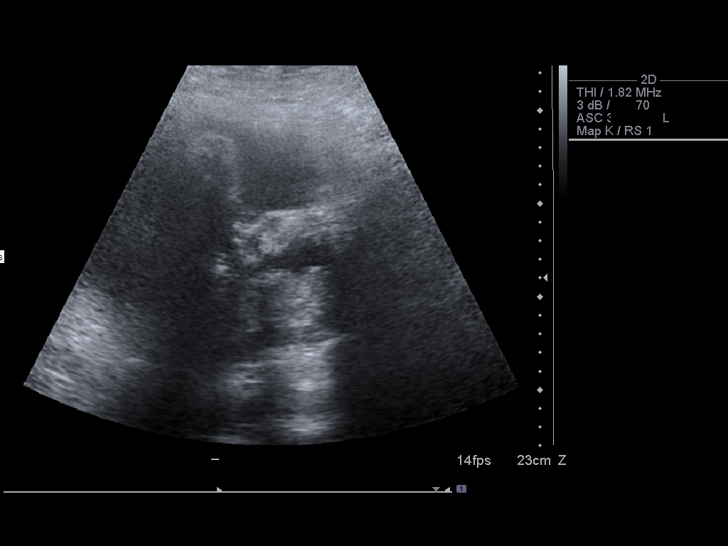
[im 18/21]
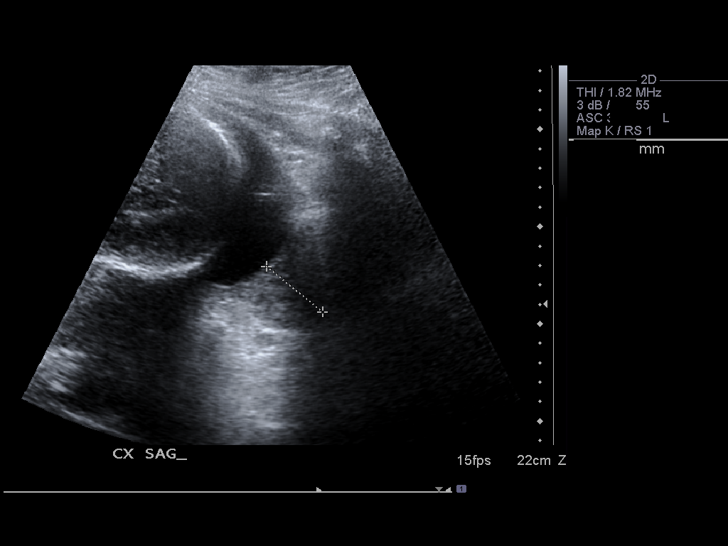
[im 19/21]
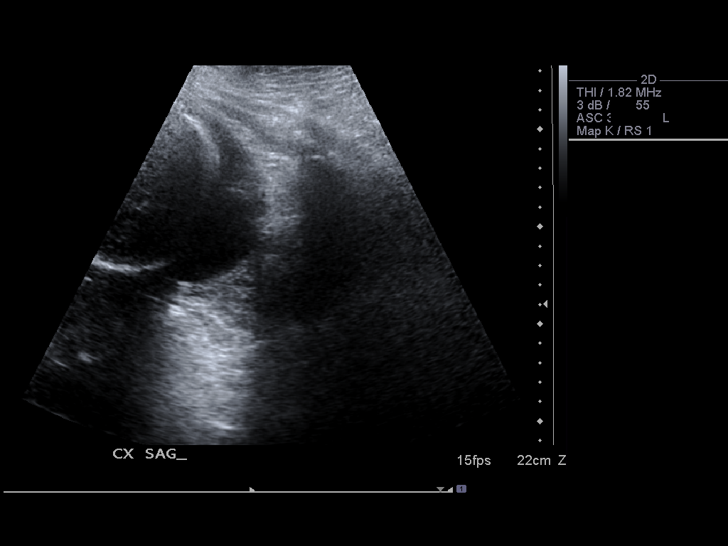
[im 21/21]
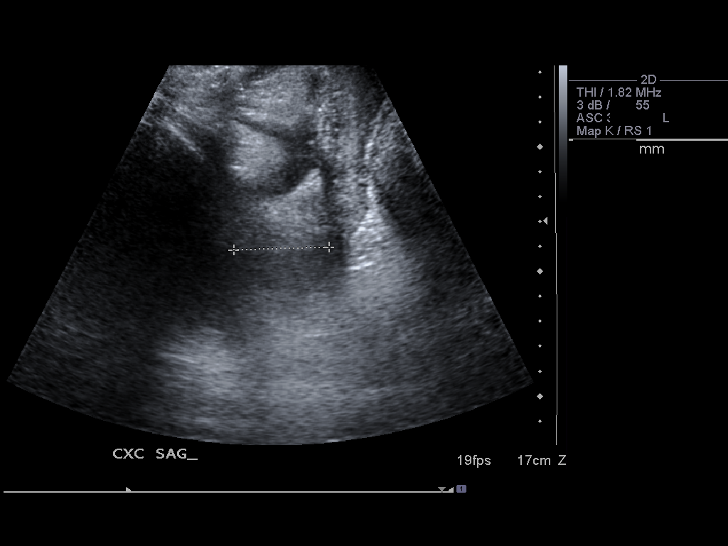

[14 of 21 positions shown; findings below may reference images not displayed]

IMPRESSION: See AS Obstetric US report.

## 2007-11-17 ENCOUNTER — Ambulatory Visit (HOSPITAL_COMMUNITY): Admission: RE | Admit: 2007-11-17 | Discharge: 2007-11-17 | Payer: Self-pay | Admitting: Obstetrics and Gynecology

## 2007-11-17 IMAGING — US US OB FOLLOW-UP
1 series · 14 of 28 positions shown · non-contrast
Comparison: none

OBSTETRICAL ULTRASOUND:
 This ultrasound exam was performed in the [HOSPITAL] Ultrasound Department.  The OB US report was generated in the AS system, and faxed to the ordering physician.  This report is also available in [REDACTED] PACS.

[Series 1: us ob follow-up · 0.45mm/px · 14 of 32 slices shown]
[im 2/32]
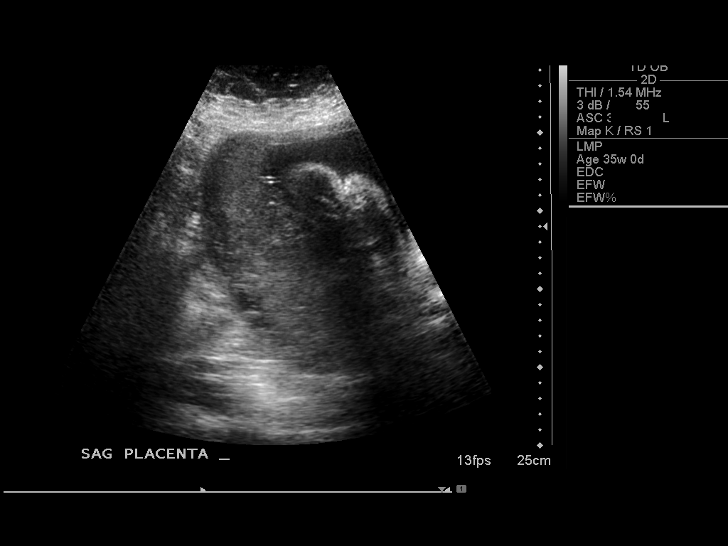
[im 4/32]
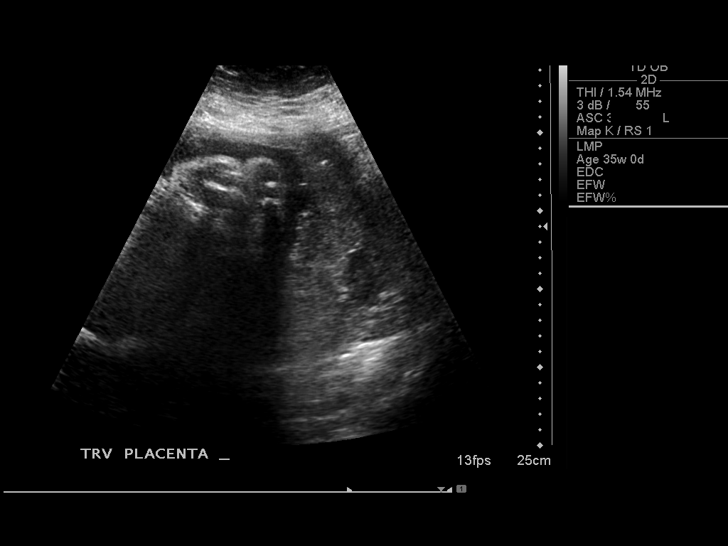
[im 6/32]
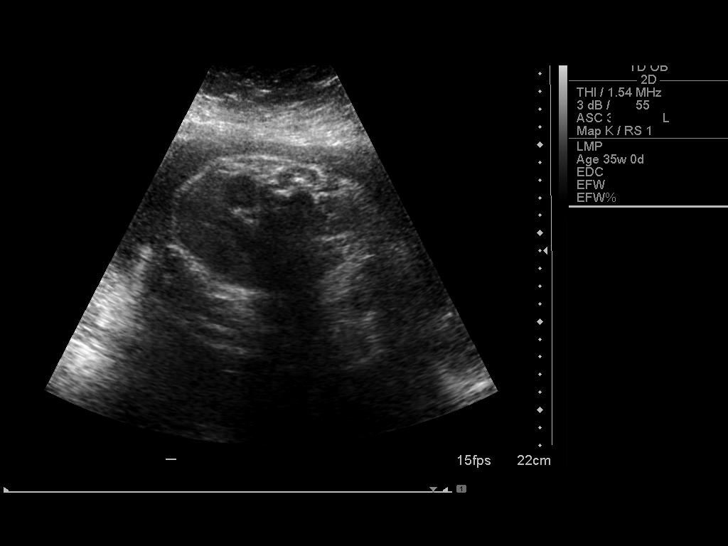
[im 9/32]
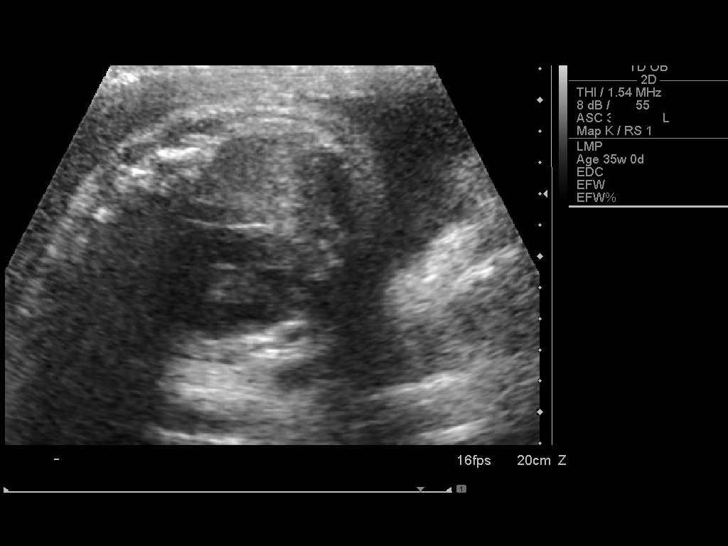
[im 11/32]
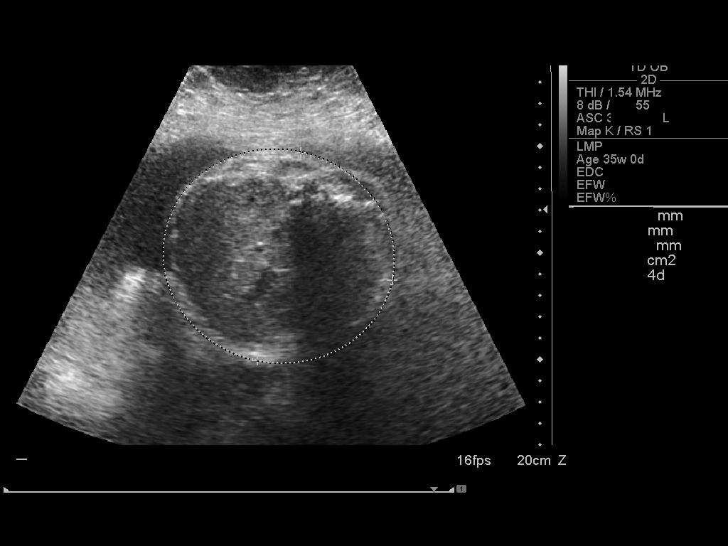
[im 13/32]
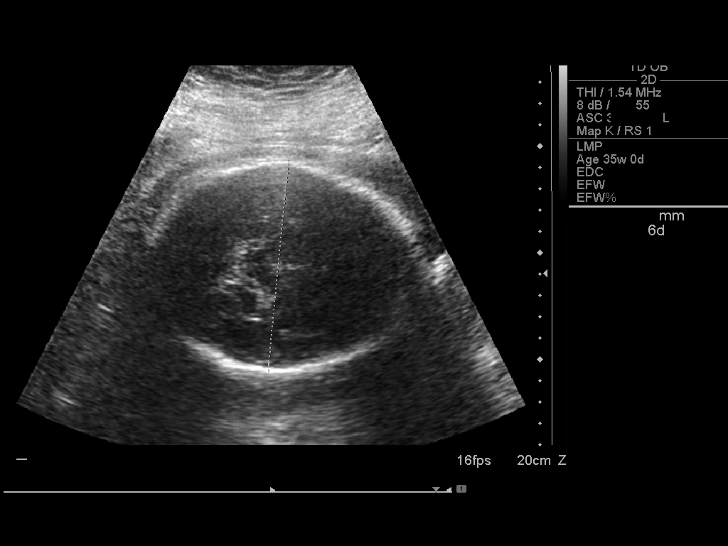
[im 15/32]
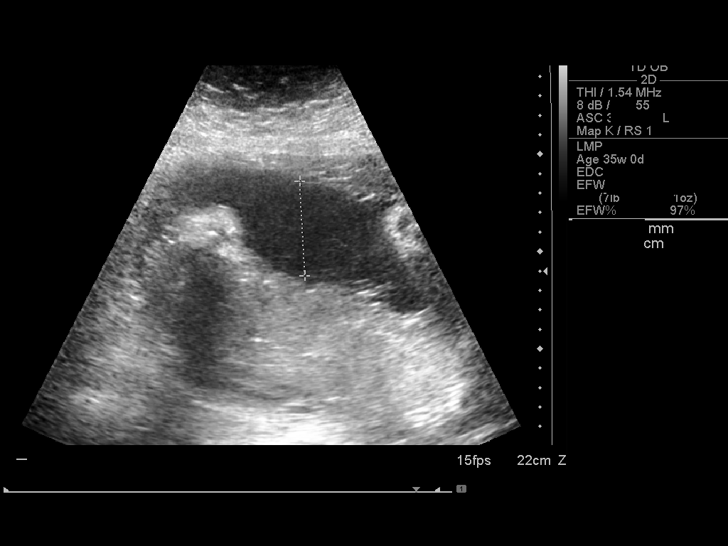
[im 18/32]
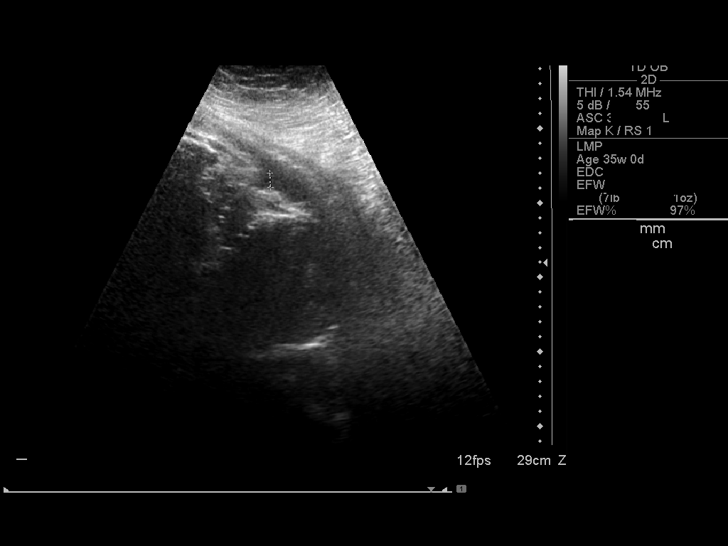
[im 20/32]
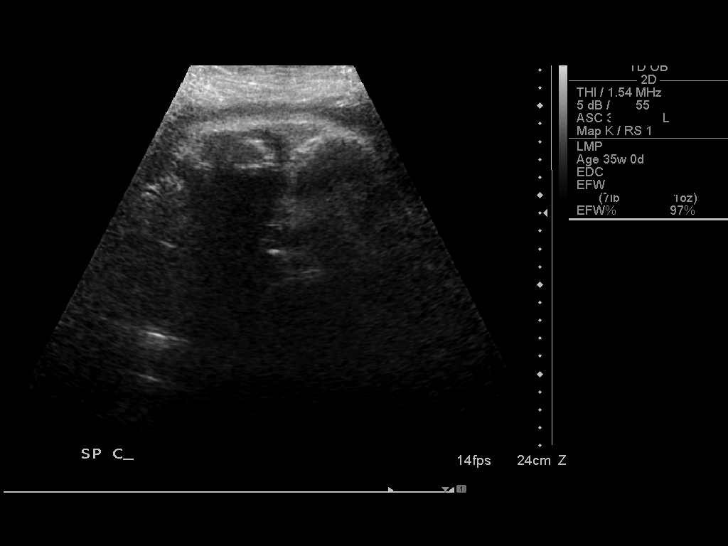
[im 22/32]
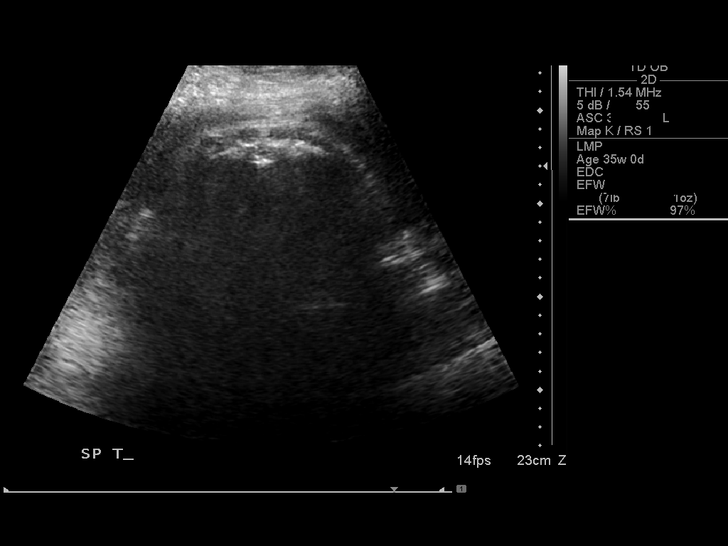
[im 25/32]
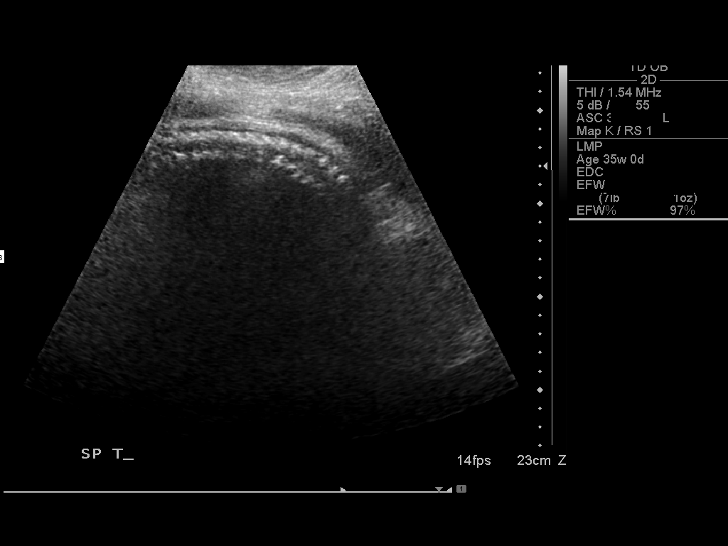
[im 27/32]
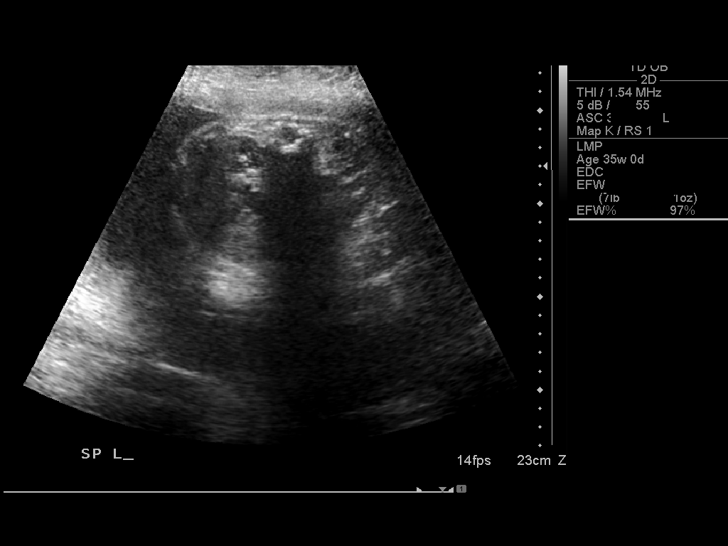
[im 29/32]
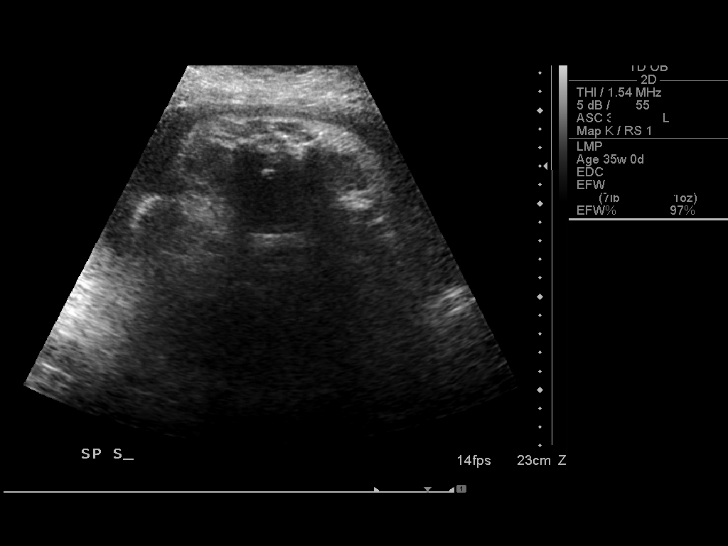
[im 32/32]
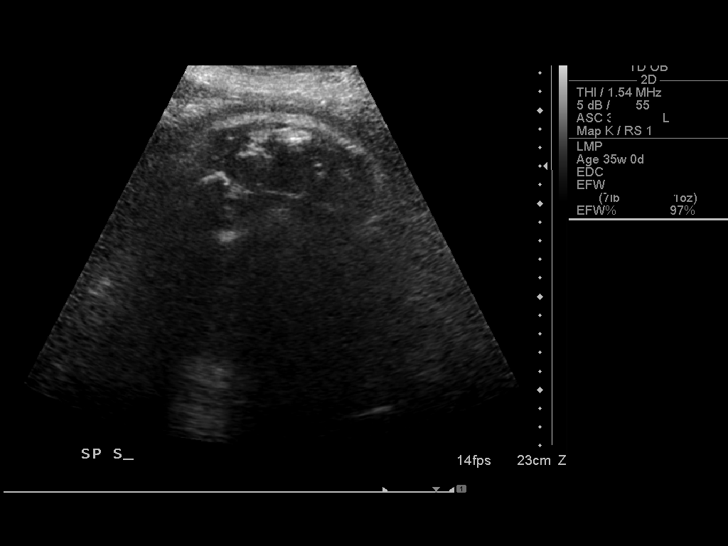

[14 of 28 positions shown; findings below may reference images not displayed]

IMPRESSION: See AS Obstetric US report.

## 2007-12-07 ENCOUNTER — Ambulatory Visit (HOSPITAL_COMMUNITY): Admission: RE | Admit: 2007-12-07 | Discharge: 2007-12-07 | Payer: Self-pay | Admitting: Obstetrics and Gynecology

## 2007-12-07 IMAGING — US US OB FOLLOW-UP
2 series · 14 of 18 positions shown · non-contrast
Comparison: none

OBSTETRICAL ULTRASOUND:
 This ultrasound exam was performed in the [HOSPITAL] Ultrasound Department.  The OB US report was generated in the AS system, and faxed to the ordering physician.  This report is also available in [REDACTED] PACS.

[Series 1: us ob re-eval · 5 of 6 slices shown (1 of 2)]
[im 1/6]
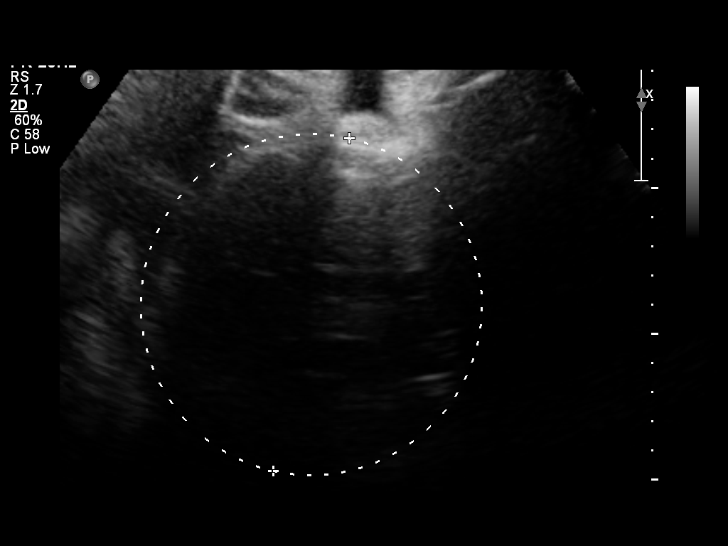
[im 2/6]
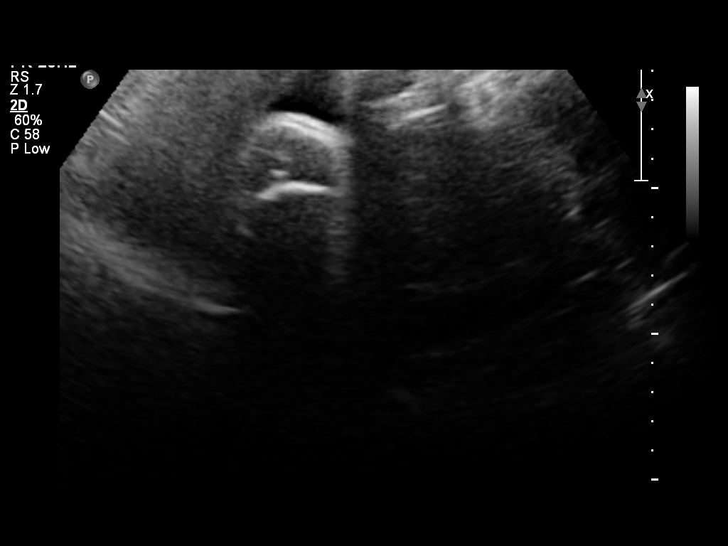
[im 4/6]
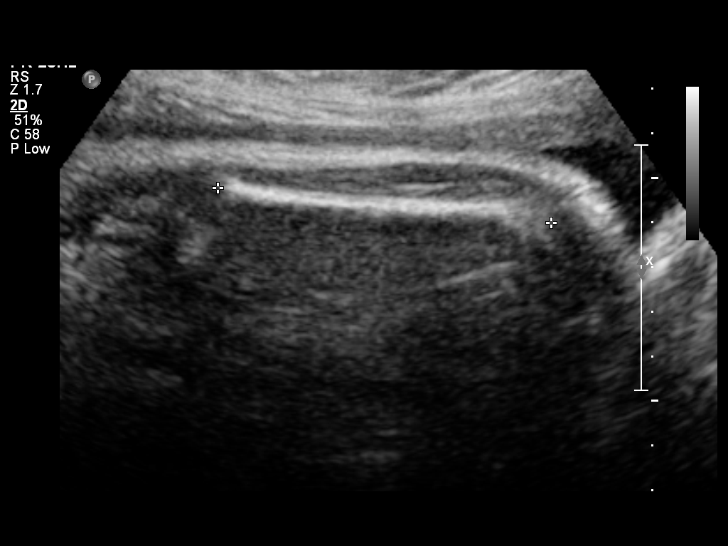
[im 5/6]
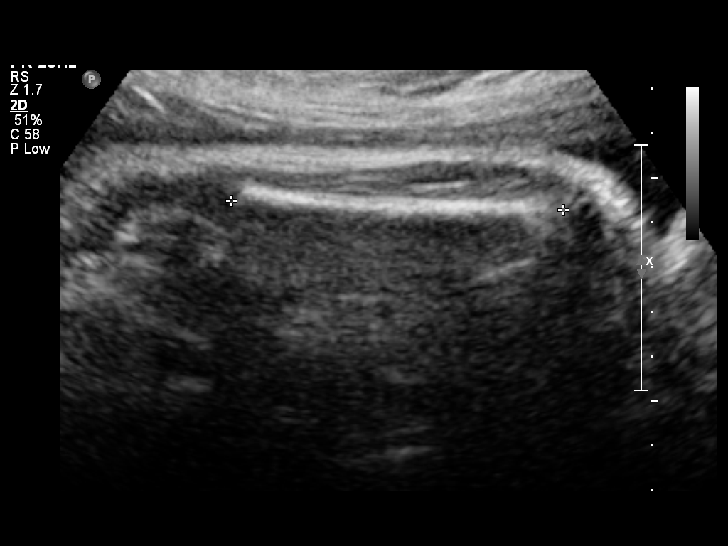
[im 6/6]
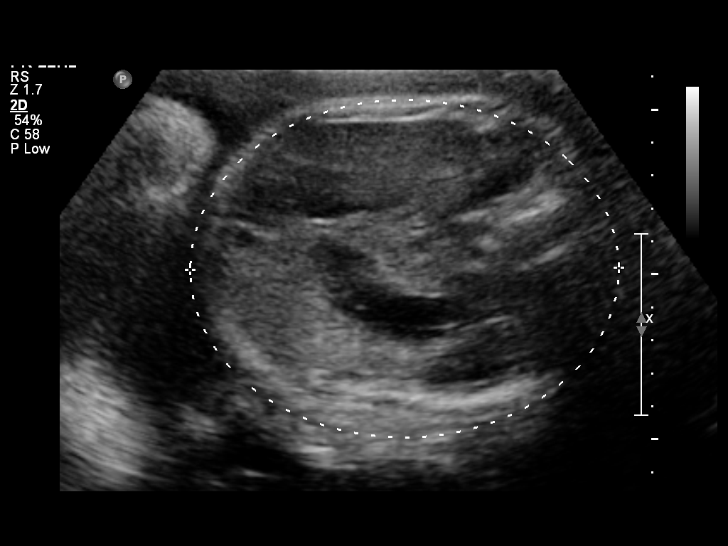

[Series 1: us ob re-eval · 9 of 12 slices shown (2 of 2)]
[im 2/12]
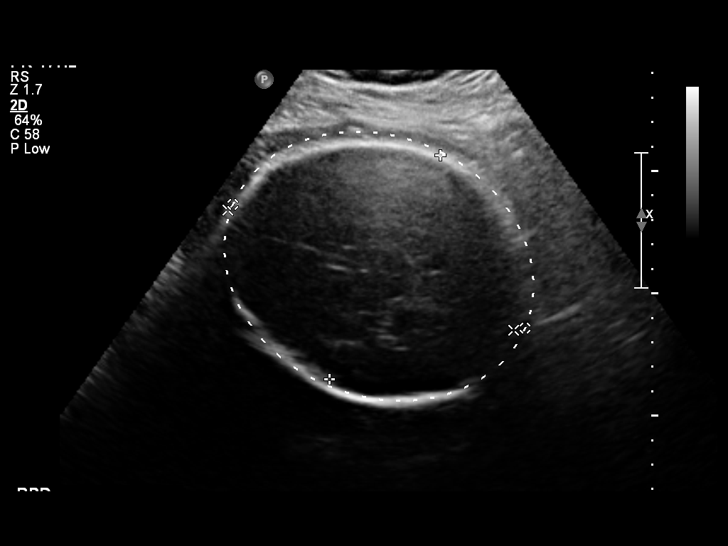
[im 3/12]
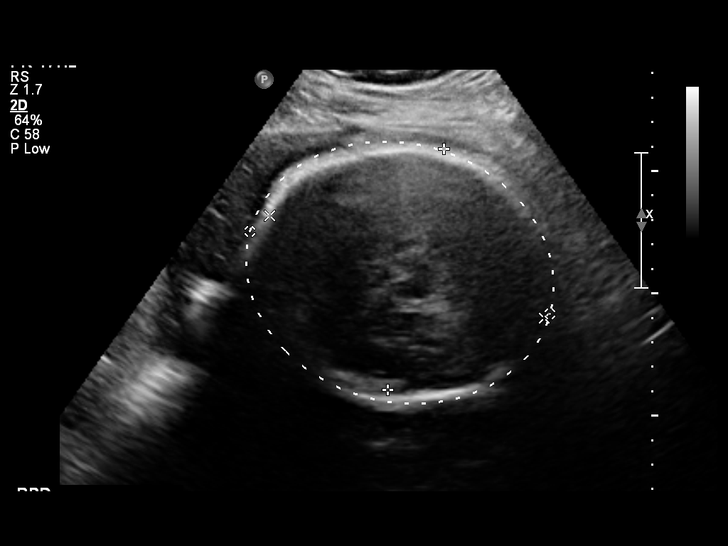
[im 4/12]
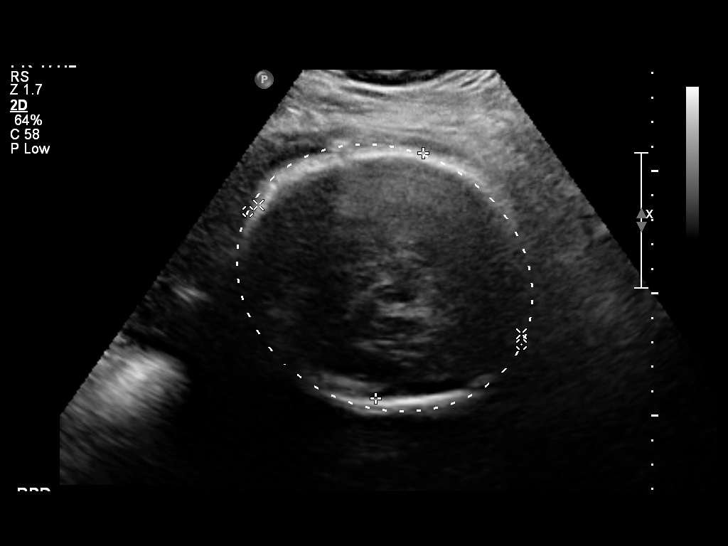
[im 5/12]
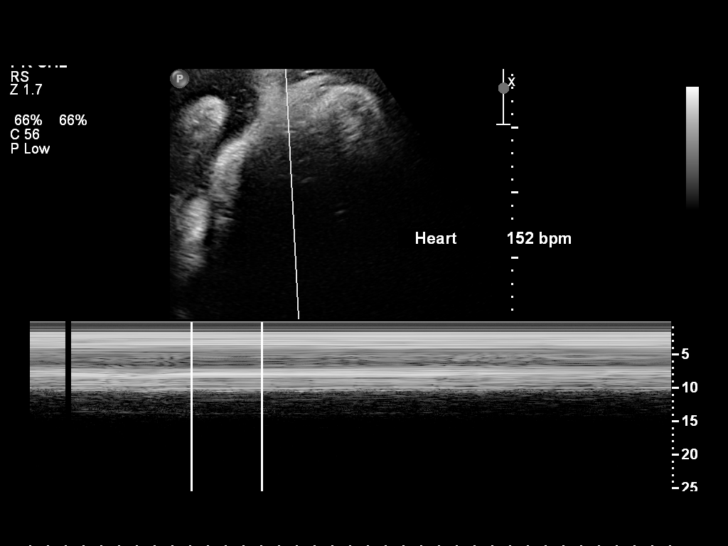
[im 7/12]
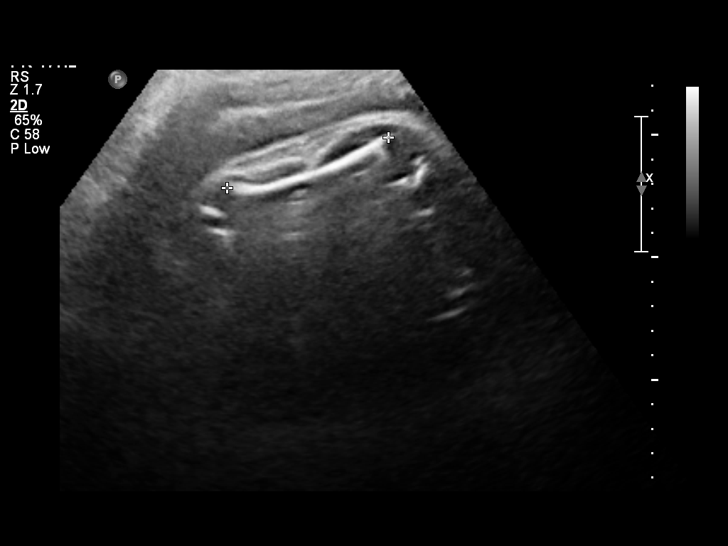
[im 8/12]
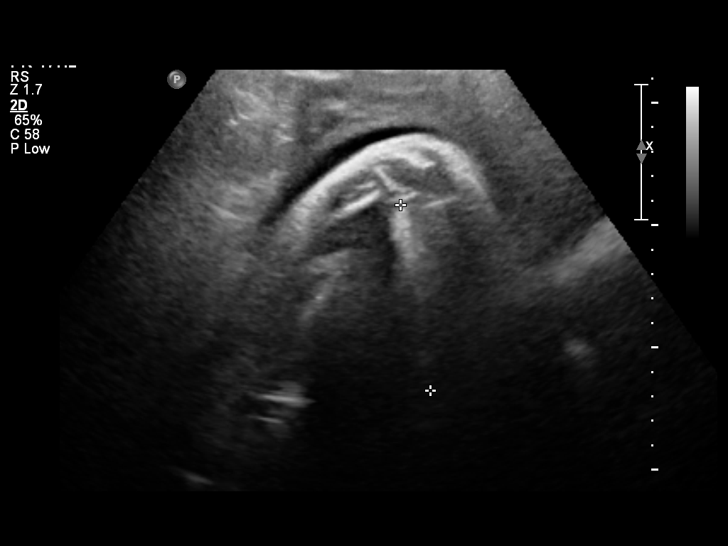
[im 9/12]
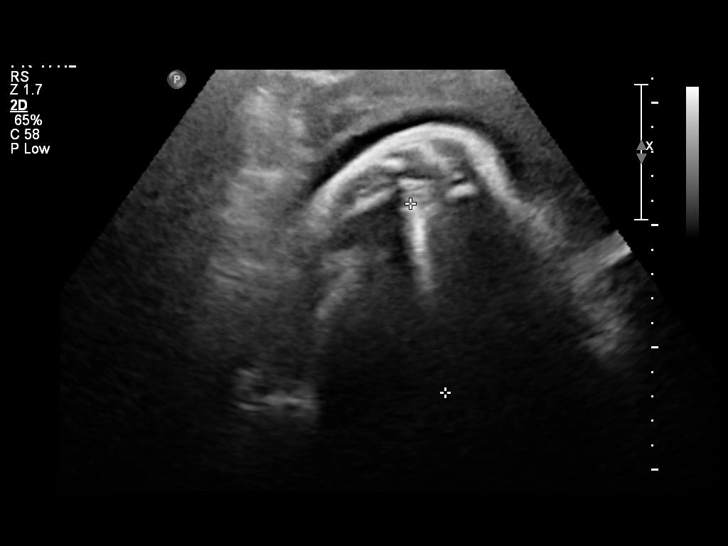
[im 11/12]
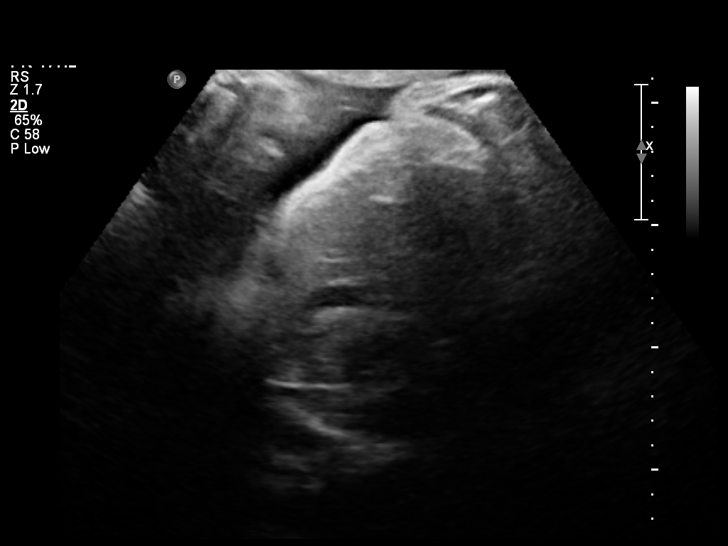
[im 12/12]
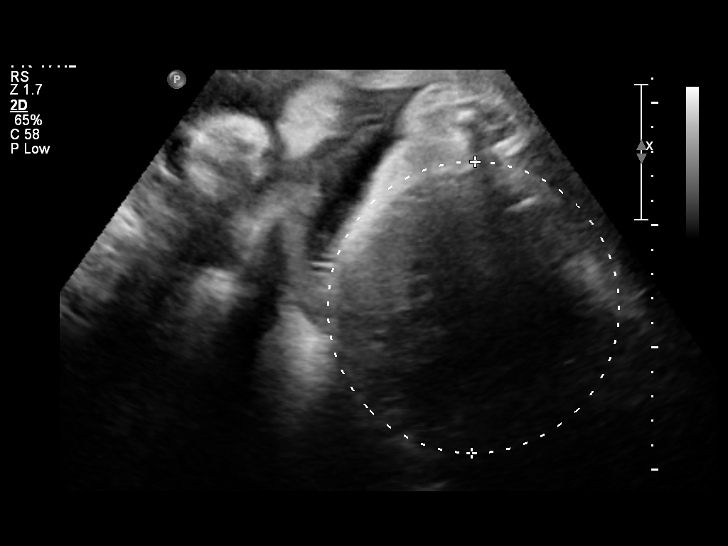

[14 of 18 positions shown; findings below may reference images not displayed]

IMPRESSION: See AS Obstetric US report.

## 2007-12-09 ENCOUNTER — Inpatient Hospital Stay (HOSPITAL_COMMUNITY): Admission: AD | Admit: 2007-12-09 | Discharge: 2007-12-09 | Payer: Self-pay | Admitting: Obstetrics and Gynecology

## 2007-12-22 ENCOUNTER — Inpatient Hospital Stay (HOSPITAL_COMMUNITY): Admission: AD | Admit: 2007-12-22 | Discharge: 2007-12-24 | Payer: Self-pay | Admitting: Obstetrics and Gynecology

## 2007-12-22 ENCOUNTER — Encounter (INDEPENDENT_AMBULATORY_CARE_PROVIDER_SITE_OTHER): Payer: Self-pay | Admitting: Obstetrics and Gynecology

## 2010-07-21 ENCOUNTER — Encounter: Payer: Self-pay | Admitting: Obstetrics and Gynecology

## 2010-11-12 NOTE — Discharge Summary (Signed)
Grace Hawkins, Grace Hawkins                ACCOUNT NO.:  [id number]   MEDICAL RECORD NO.:  [id number]          PATIENT TYPE:  INP   LOCATION:  9133                          FACILITY:  [facility]   PHYSICIAN:  Malachi Pro. Ambrose Mantle, M.D. DATE OF BIRTH:  August 16, 1975   DATE OF ADMISSION:  12/22/2007  DATE OF DISCHARGE:  12/24/2007                               DISCHARGE SUMMARY   HISTORY OF PRESENT ILLNESS:  A 35 year old white married female para 1-1-  0-2, gravida 3, [facility] December 22, 2007 by ultrasound, admitted for induction  of labor.  Blood group and type AB positive with a negative antibody,  nonreactive serology, rubella immune, hepatitis B surface antigen  negative, HIV negative, GC and chlamydia negative, 1-hour Glucola 144, 3-  hour GTT 77, 132, 117, and 75.  Quad screen negative.  Group B strep  negative.  Vaginal ultrasound on May 19, 2007, crown-rump length  2.28 cm, 9 weeks zero days, [facility] December 22, 2007.  Ultrasound at 22 weeks,  [facility] December 14, 2007.  The patient started on Delalutin on August 20, 2007, and got weekly injections.  Thyroid function tests on October 05, 2007 were normal.  Ultrasound at 31 weeks', estimated fetal weight 5  pounds 2 ounces greater than 90th percentile.  Ultrasound on Nov 17, 2007 at 35 weeks', estimated fetal weight 7 pounds 2 ounces.  On December 07, 2007 at 38 weeks', estimated fetal weight 9 pounds 2 ounces.  The  patient's son developed fifth disease.  Her IgG showed immunity.  Nonstress test 2 times a week for decreased fetal movement had been  normal.  The patient is admitted now for induction.   PAST MEDICAL HISTORY:  No known allergies.  No operations.  She does  have hypothyroidism.  Alcohol, tobacco and drugs none.   FAMILY HISTORY:  Mother with rheumatoid arthritis, father with high  blood pressure.  Maternal grandparents with lung cancer.   OBSTETRICAL HISTORY:  In March 2003, she delivered a 6 pounds 3-ounce  female at 34+ weeks after preterm premature  rupture of the membranes,  August 2007, a 7 pounds 9-ounce female at 37 plus weeks'.   MEDICATIONS:  Synthroid 250 mcg a day.   HOSPITAL COURSE:  On admission, her vital signs were normal.  Heart and  lungs were normal. The abdomen was soft, markedly obese with a weight of  350 pounds.  Fundal height had been 42.5 cm.  On December 21, 2007, cervix  was 2 cm, 50% vertex, at a -2 to -3.  On December 21, 2007, 3 cm, 70%-80% at  12:35 p.m.  On December 22, 2007, artificial rupture of membranes produced  clear fluid.  Fetal scalp electrode was applied.  The patient had been  on Pitocin.  At 1:30 p.m., her Pitocin was at 2 milliunits a minute.  Contractions every 1-1/2 to 2-1/2 minutes.  The patient received an  epidural.  The cervix was 4 cm, 80%, vertex at -2.  There were variable  decelerations with each contraction.  The decelerations had actually  been present before any Pitocin  was started shortly after admission.  There was good variability and good recovery.  By 6:35 p.m., the Pitocin  was 4 milliunits a minute.  The patient had had and continued to have  variable decelerations with almost every contraction.  There was good  variability, excellent recovery, and good response to scalp stimulation.  With the patient on her left side, the cervix felt 7-cm.  She became  fully dilated at 6:53 p.m. with the vertex at a -3 station.  At 7:25  p.m., she was pushing well, decelerations persisted, but there was good  recovery.  The vertex had come to a -1 to 0 station with contractions  and the LOT to LOA position.  She brought the vertex to the perineum,  then rapidly became ready for delivery.  After delivery of the head it  became obvious that there was dark meconium-stained fluid.  The bulb and  the DeLee were done with the head on the perineum.  No meconium was  recovered in the DeLee.  There were 2 loops of nuchal cord that could  not be reduced.  The shoulders delivered easily..  The infant was  female,  8 pounds 3 ounces, Apgars were 8 at 1 and 9 at 5 minutes.  After 2  minutes of age, there was some sternal retraction.  The lungs showed  rhonchi,, so I called Dr. Radene Ou who arrived at approximately 5 minutes  of age.  Chest percussion was done, endotracheal tube was passed, and  there was no meconium seen below the cords.  The placenta delivered  intact.  First-degree perineal laceration was repaired with 3-0 Vicryl.  Blood loss about 500 mL.  The uterus felt normal.  Postpartum, the  patient did well.  Her only complaint postpartum was numbness of the  right leg.  I evaluated her neurologically, found no neurological  deficit.  She was seen by anesthesia and on the morning of discharge the  numbness had gone away.  She was ambulating well without difficulty and  she was ready for discharge.  The baby, however, was having some  problems with feeding so the baby had been taken to the nursery to check  its O2 sat and for further observation.  On admission, the patient's  hemoglobin was 12.1, hematocrit 34.9, white count 99,100, platelet count  212,000.  There were 83 segs, 9 lymphs, and 6 monocytes.  Followup  hemoglobin 10.6, hematocrit 30.5, white count 11,500.  RPR was  nonreactive.   FINAL DIAGNOSES:  1. Intrauterine pregnancy at 40 weeks' delivered occipitoanterior.  2. Hypothyroidism.  3. Marked obesity.   OPERATION:  Spontaneous delivery occipitoanterior, repair of perineal  laceration.   FINAL CONDITION:  Improved.   INSTRUCTIONS:  Our regular discharge instruction booklet.  Percocet  5/325, thirty tablets and Motrin 600 mg, 30 tablets are given at  discharge.   FOLLOWUP:  The patient is to return to the office in 6 weeks for  followup examination and report any problems.      Malachi Pro. Ambrose Mantle, M.D.  Electronically Signed     Malachi Pro. Ambrose Mantle, M.D.  Electronically Signed    TFH/MEDQ  D:  12/24/2007  T:  12/24/2007  Job:  [id number]

## 2010-11-15 NOTE — Discharge Summary (Signed)
[facility]  Patient:    Grace Hawkins, Grace Hawkins Visit Number: [id number] MRN: [id number]          Service Type: OBS Location: 910A [id number] [id number] Attending Physician:  Malon Kindle Dictated by:   Malachi Pro. Ambrose Mantle, M.D. Admit Date:  09/04/2001 Discharge Date: 09/06/2001                             Discharge Summary  HISTORY OF PRESENT ILLNESS:   A 35 year old white female, para 0, gravida 1, last period December 26, 2000, [facility] of October 03, 2001 by date but October 13, 2001 by ultrasound.  She was admitted with preterm premature rupture of the membranes and in early labor.  Blood group and type is AB positive, negative antibody, nonreactive serology. Rubella immuned.  Hepatitis B surface antigen negative.  HIV negative.  GC and chlamydia negative.  Triple screen normal.  One hour glucola not on the chart. Group B strep not done.  Vaginal ultrasound on March 03, 2001, crown length 1.58 cm, 8 weeks 0 days, [facility] October 13, 2001.  Repeat ultrasound May 13, 2001, average gestational age [redacted] weeks 4 days, [facility] October 10, 2001.  The patient used Synthroid 200 mcg per day.  On August 19, 2001, the cervix was 1 cm, 50% vertex, at a -2.  Ultrasound showed an estimated fetal weight of 2300 gm, amniotic fluid index of 12.6. The patient was placed at bed rest.  On the day of admission, the patient had spontaneous rupture of membranes at approximately 10 a.m.  She began contracting and on admission was 2 cm, 100% vertex, and a -2 station.  ALLERGIES:                    No known allergies.  PAST SURGICAL HISTORY:        No operations.  PAST MEDICAL HISTORY:         Hypothyroidism.  FAMILY HISTORY:               Father with high blood pressure.  Maternal grandmother and grandfather with lung cancer.  SOCIAL HISTORY:               Alcohol, tobacco, and drugs none.  PHYSICAL EXAMINATION:  VITAL SIGNS:                  Normal.  ABDOMEN:                       Soft.  Fundal height 38 cm on August 19, 2001. Fetal heart tones normal.  PELVIC:                       Cervix 2+, 100% vertex, at -2.  IMPRESSION:                   1. Intrauterine pregnancy at 34+ weeks.                               2. Preterm premature rupture of the membranes.                               3. Early labor.  HOSPITAL COURSE:              The patient  was placed on IV penicillin to cover group B strep.  She received an epidural and progressed to 10 cm.  After I arrived, she began pushing and there was fetal bradycardia that did not respond to uterine displacement, oxygen, or scalp stimulation.  Low forceps delivery from a +2 station ROA was done after 10 to 11 minutes of bradycardia over a small left mediolateral episiotomy and a long right sulcus laceration that went to the cervix.  Placenta was intact.  Uterus normal.  Rectal negative.  Left mediolateral episiotomy and right sulcus laceration repaired with 3-0 Dexon with assistance from the nurses.  The infant was female, 6 pounds 2 ounces.  Apgars 3 at one minute, 7 at 5 five minutes, and 8 at ten minutes.  Dr. Adelene Amas. Imm was in attendance after 45 seconds after delivery and he assigned the Apgars.  Postpartum, the patient did quite well.  She did complain of some rectal pressure so I examined her twice and there was no evidence of any hematoma. Initial hemoglobin 13.4, hematocrit 38.6, white count 13,700, platelet count 249,000.  Follow-up hemoglobin 10.0, hematocrit 28.5, white count 14,400, platelet count 204,000.  RPR was nonreactive.  The patient requested to know when her thyroid needed to be checked.  So, it is being checked prior to discharge.  She will call the office in two days to get the results.  FINAL DIAGNOSES:              1. Intrauterine pregnancy at 34+ weeks,                                  delivered right occipitoanterior.                               2. Terminal fetal bradycardia.                                3. Preterm premature rupture of the membranes.                               4. Low forceps delivery right occipitoanterior.                               5. Left mediolateral episiotomy.                               6. Right sulcus laceration and repair.  DISPOSITION:                  The baby is in the NICU receiving nasal CPap at 25% oxygen.  CONDITION ON DISCHARGE:       Improved.  DISCHARGE INSTRUCTIONS:       Include our regular discharge instructions booklet.  She will get a TSH drawn before discharge and we will check on the results in two days.  She is to return in six weeks for follow-up examination.Dictated by:   Malachi Pro. Ambrose Mantle, M.D. Attending Physician:  Malon Kindle DD:  09/06/01 TD:  09/07/01 Job: [id number]

## 2010-11-15 NOTE — Discharge Summary (Signed)
NAMEDAESHA, [employer]                ACCOUNT NO.:  [id number]   MEDICAL RECORD NO.:  [id number]          PATIENT TYPE:  INP   LOCATION:  9123                          FACILITY:  [facility]   PHYSICIAN:  Malachi Pro. Ambrose Mantle, M.D. DATE OF BIRTH:  02-24-1976   DATE OF ADMISSION:  02/25/2006  DATE OF DISCHARGE:  02/27/2006                                 DISCHARGE SUMMARY   A 35 year old white married female, para 0-1-0-1, gravida 2, [facility] March 15, 2006 by ultrasound, admitted after premature rupture of the membranes at  6:30 a.m.  Blood group and type:  AB positive, negative antibody, non-  reactive serology, rubella immune, hepatitis-B surface antigen negative, HIV  negative, GC and Chlamydia negative, one-hour Glucola 119, Group-B Strep  negative, quad screen negative.  Vaginal ultrasound on July 24, 2005,  crown-rump length 0.83 cm, 6 weeks, 4 days.  Ultrasound August 09, 2005, 8  weeks, 6 days, [facility] March 15, 2006.  Ultrasound on Oct 28, 2005, average  gestational age [redacted] weeks, 3 days, [facility] March 14, 2006.  Patient was  treated with a Z-Pack on Nov 12, 2005 for sinusitis.  Synthroid was  increased to 275 mcg per day on Nov 20, 2005, TSH on January 06, 2006, 4.23.  Synthroid dose was left the same.  On December 30, 2005, ultrasound showed an  estimated fetal weight of 1697 g.  Ultrasound on February 16, 2006, the size  greater than days, the estimated fetal weight was 7 pounds 13 ounces.  Patient had spontaneous rupture of membranes on the day of admission at 6:30  a.m.  She came to the hospital contracting, and at 11:55 a.m. the cervix was  3-4 cm, 100% vertex at a -2.   PAST MEDICAL HISTORY:  No known allergies.  Cryo of the cervix in 2000,  hypothyroidism or illness.   FAMILY HISTORY:  Father with high blood pressure and maternal grandparents  with lung cancer.   OBSTETRIC HISTORY:  In March of 2003, the patient delivered a 6-pounds, 3-  ounce female at 34+ weeks after pre-term premature  rupture of the membranes.  Delivery was done by low forceps.  On admission, the patient's blood  pressures were somewhat irregular, reaching a high of 151/90 but then  settling down to 129/77.  Heart and lungs were normal.  The abdomen was  soft.  Fundal height had been 43-1/2 cm in the office on the last exam.  Cervix was 3-4 cm, 100%.  The patient requested an epidural.  Unbeknownst to  me, the epidural was complicated by a wet tap, but ultimately was quite  effective in taking the pain.  At 143 p.m., the contractions were greater  than 4 minutes apart.  Cervix was 4 cm, 100%.  At 4:33 p.m., the patient had  become fully dilated at 3:09 p.m. and pushed well.  She delivered  spontaneously OA over a second-degree midline laceration a living female  infant 7 pounds 9 ounces, Apgars of 8 at 1 and 9 at 5 minutes.  Dr. Ambrose Mantle  was in attendance.  Placenta was intact.  Uterus normal.  Second-degree  midline laceration repaired with 3-0 Vicryl.  Rectum was intact, blood loss  about 500 cc.  Postpartum, the patient's main complaint was a bad headache  when sitting or standing.  She as seen by Dr. Tacy Dura who gave her an  epidural blood patch.  She responded quite nicely to that, has no headache  now, and on the second postpartum day is ready for discharge.  Initial  hemoglobin of 12.9, hematocrit 36.8, white count 11,700, platelet count  252,000, followup hemoglobin 11.3.  RPR was nonreactive, TSH 2.921.   FINAL DIAGNOSES:  1. Intrauterine pregnancy at 37 weeks and 3 days, delivered OA.  2. Post-spinal headache.   OPERATIONS:  1. Spontaneous delivery OA.  2. Repair of secondary midline laceration.  3. Epidural blood patch by Dr. Tacy Dura.   FINAL CONDITION:  Improved.   INSTRUCTIONS:  Include regular discharge instructions.  Return to the office  in 6 weeks, Percocet 5/325, 24 tablets, one every 4 to 6 hours as needed for  pain, and also continue the Synthroid at 275 mcg a day.       Malachi Pro. Ambrose Mantle, M.D.  Electronically Signed     TFH/MEDQ  D:  02/27/2006  T:  02/27/2006  Job:  [id number]

## 2010-12-17 ENCOUNTER — Other Ambulatory Visit: Payer: Self-pay | Admitting: Family Medicine

## 2010-12-17 DIAGNOSIS — K8050 Calculus of bile duct without cholangitis or cholecystitis without obstruction: Secondary | ICD-10-CM

## 2010-12-18 ENCOUNTER — Ambulatory Visit (HOSPITAL_COMMUNITY)
Admission: RE | Admit: 2010-12-18 | Discharge: 2010-12-18 | Disposition: A | Discharge status: Home / Self Care | Payer: 59 | Source: Ambulatory Visit | Attending: Family Medicine | Admitting: Family Medicine

## 2010-12-18 DIAGNOSIS — K8020 Calculus of gallbladder without cholecystitis without obstruction: Secondary | ICD-10-CM | POA: Insufficient documentation

## 2010-12-18 DIAGNOSIS — K8050 Calculus of bile duct without cholangitis or cholecystitis without obstruction: Secondary | ICD-10-CM

## 2010-12-18 DIAGNOSIS — K7689 Other specified diseases of liver: Secondary | ICD-10-CM | POA: Insufficient documentation

## 2010-12-18 IMAGING — US US ABDOMEN COMPLETE
1 series · 14 of 25 positions shown · non-contrast
Comparison: None.

CLINICAL DATA: Biliary colic

COMPLETE ABDOMINAL ULTRASOUND

[Series 1: us abdomen complete · 0.34mm/px · 14 of 76 slices shown]
[im 1/76]
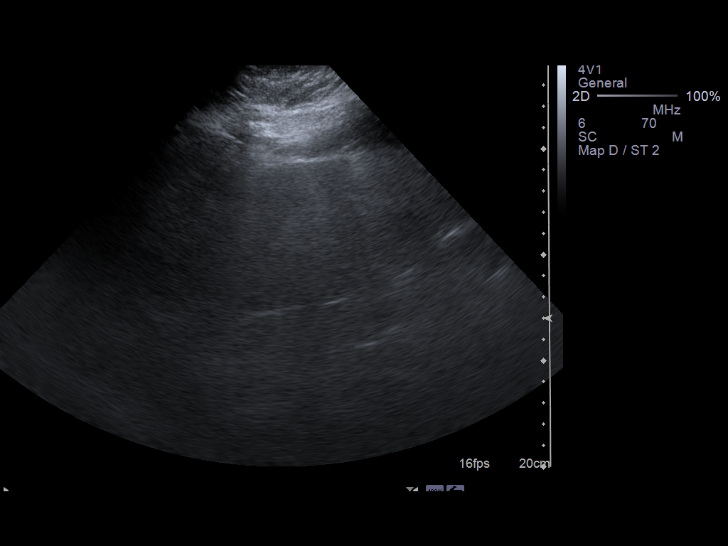
[im 7/76]
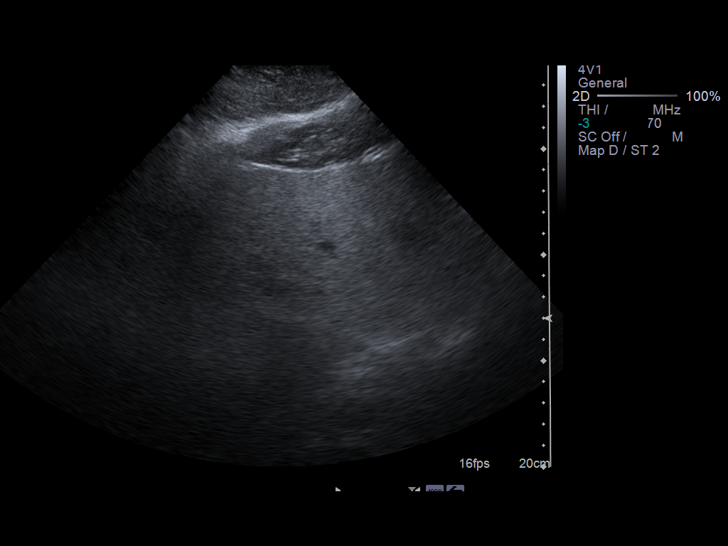
[im 13/76]
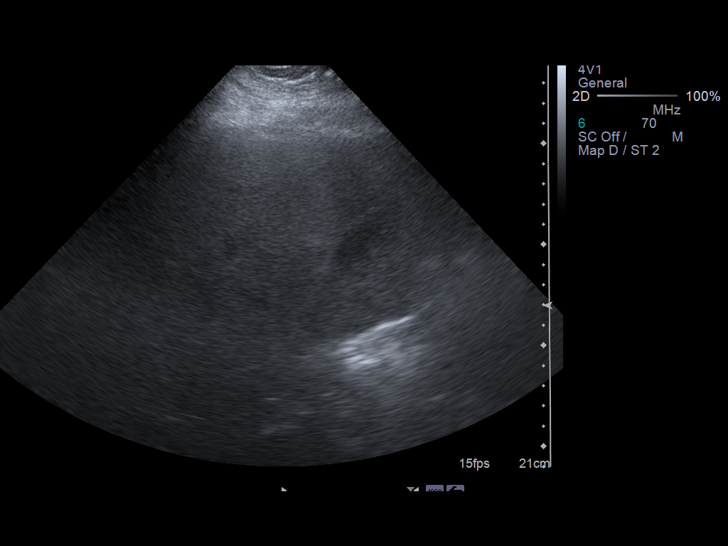
[im 19/76]
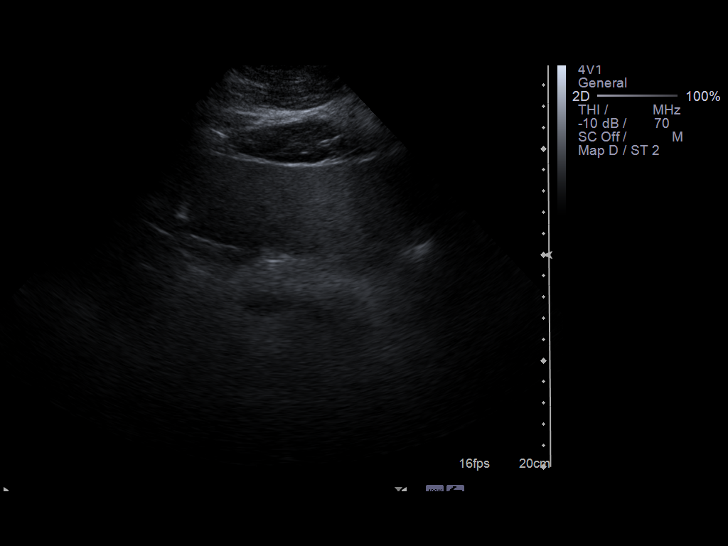
[im 26/76]
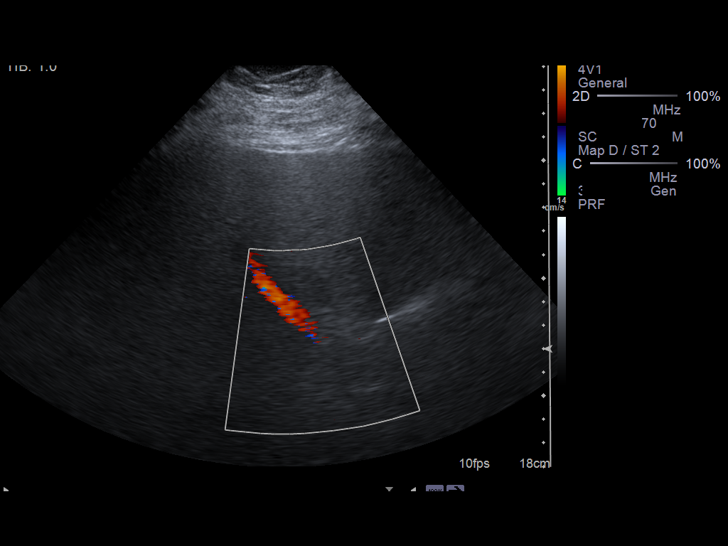
[im 29/76]
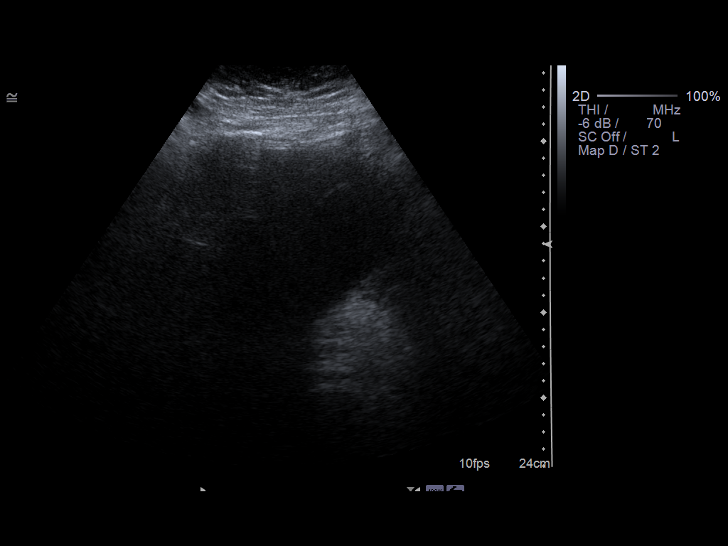
[im 35/76]
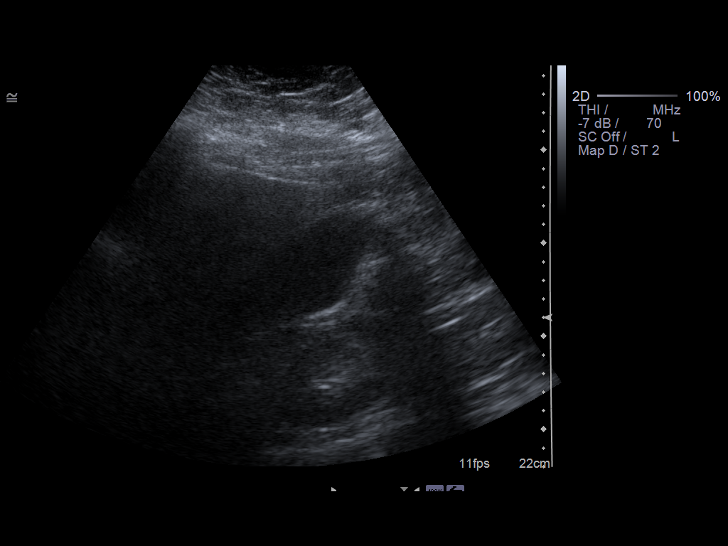
[im 41/76]
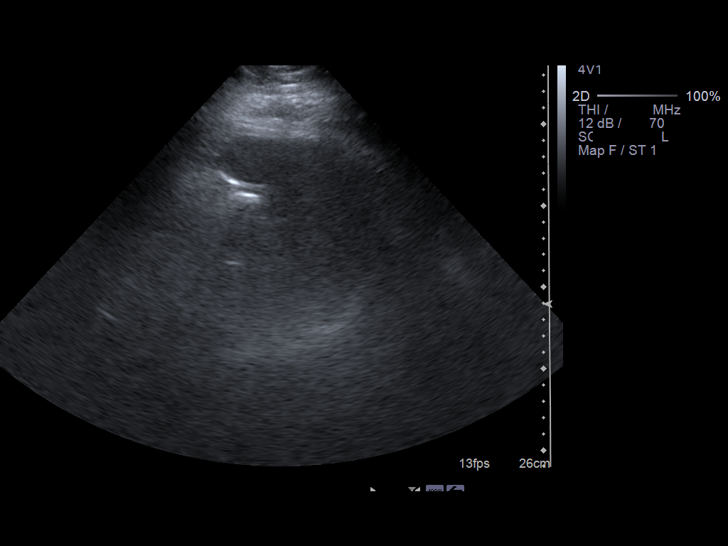
[im 47/76]
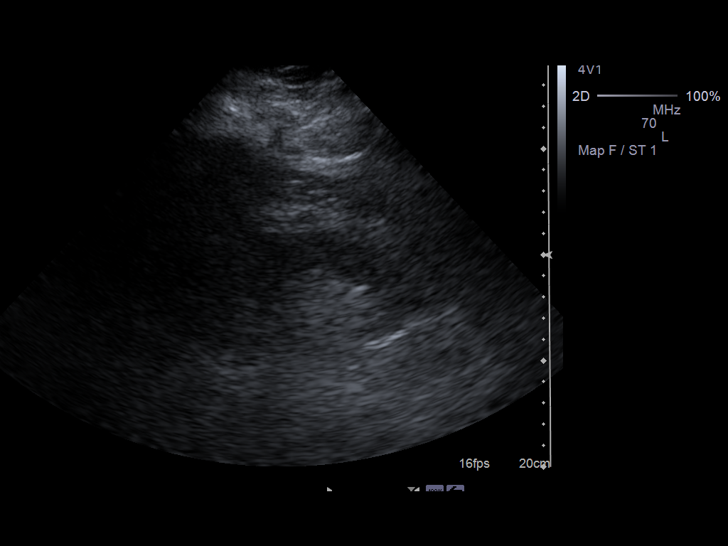
[im 51/76]
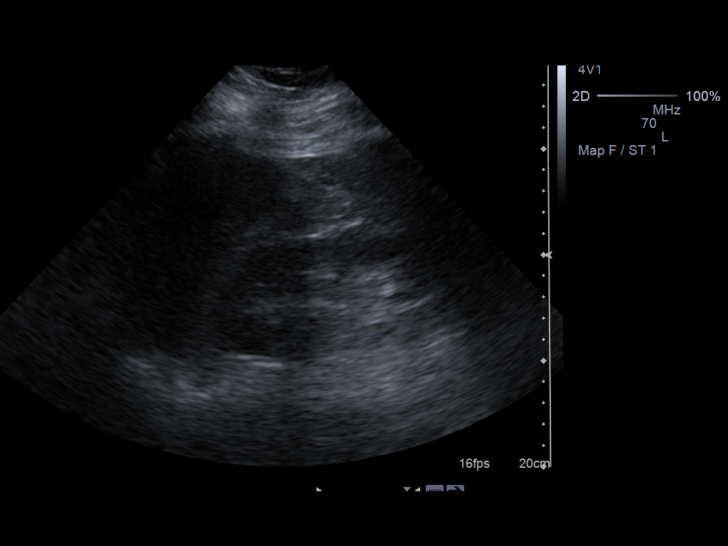
[im 57/76]
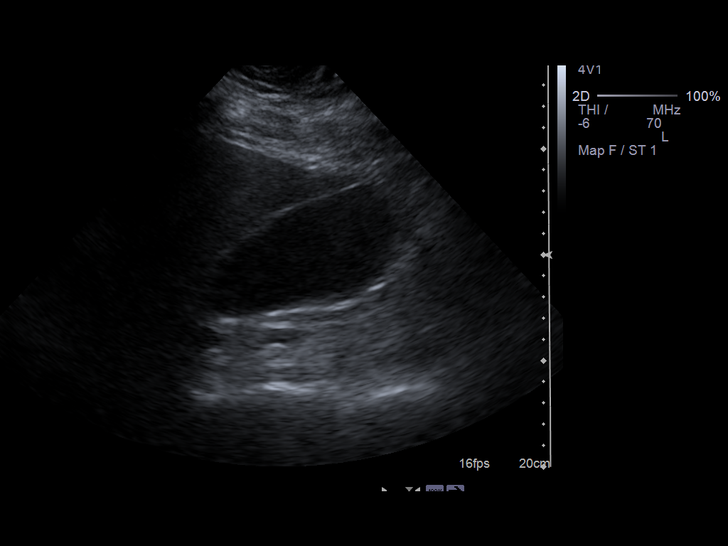
[im 63/76]
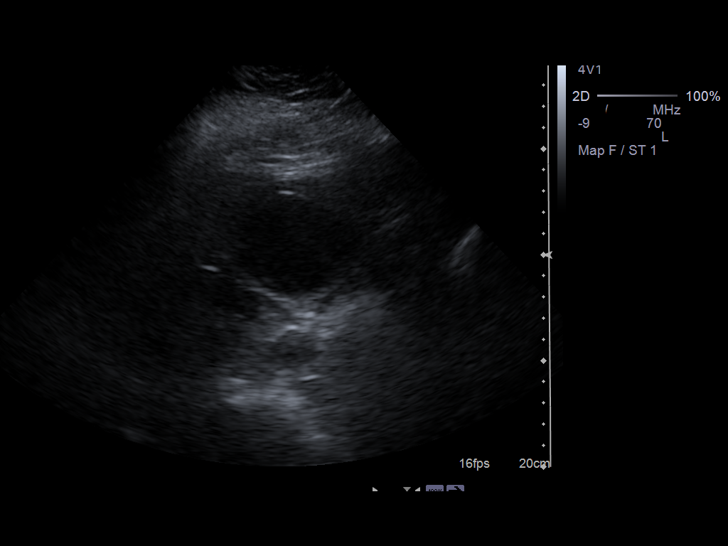
[im 69/76]
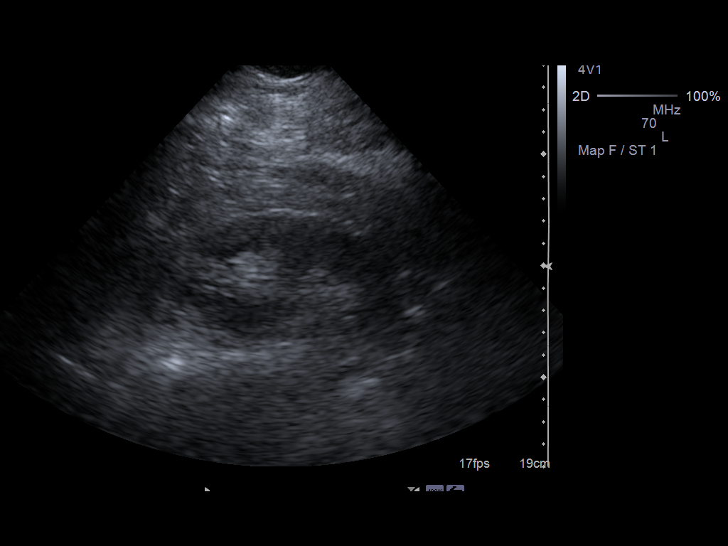
[im 76/76]
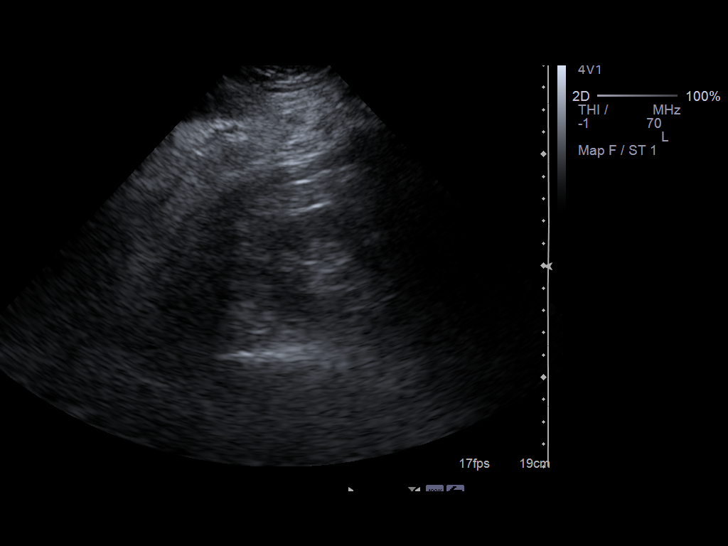

[14 of 25 positions shown; findings below may reference images not displayed]

FINDINGS: Gallbladder:  No gallstones, gallbladder wall thickening, or
pericholecystic fluid.

Common bile duct:   Normal caliber, 5 mm.

Liver:  Diffusely increased echotexture compatible with fatty
duration.  No focal abnormality or biliary ductal dilatation.

IVC:  Not well visualized due to body habitus and overlying gas.

Pancreas:  Not well visualized due to body habitus and overlying
bowel gas.  Pancreatic head unremarkable.

Spleen:  Within normal limits in size and echotexture.

Right Kidney:   Normal in size and parenchymal echogenicity.  No
evidence of mass or hydronephrosis.

Left Kidney:  Normal in size and parenchymal echogenicity.  No
evidence of mass or hydronephrosis.

Abdominal aorta:  No aneurysm identified.
IMPRESSION: Diffuse fatty infiltration of the liver.  No acute findings.

## 2011-03-27 LAB — CBC
HCT: 30.5 — ABNORMAL LOW
MCHC: 34.8
MCV: 87.3
MCV: 88.3
Platelets: 183
Platelets: 212
RBC: 3.45 — ABNORMAL LOW
RBC: 4

## 2011-03-27 LAB — DIFFERENTIAL
Basophils Absolute: 0
Eosinophils Relative: 1
Lymphocytes Relative: 9 — ABNORMAL LOW
Lymphs Abs: 0.9

## 2011-04-16 LAB — CBC
MCHC: 34.7
MCHC: 34.9
MCV: 83.1
Platelets: 295
Platelets: 297
RDW: 12.7
RDW: 13.4
WBC: 21.3 — ABNORMAL HIGH

## 2011-04-16 LAB — DIFFERENTIAL
Basophils Absolute: 0
Basophils Relative: 0
Basophils Relative: 0
Eosinophils Absolute: 0
Eosinophils Absolute: 0.2
Eosinophils Relative: 2
Lymphocytes Relative: 9 — ABNORMAL LOW
Lymphocytes Relative: 9 — ABNORMAL LOW
Lymphs Abs: 0.9
Lymphs Abs: 1.9
Metamyelocytes Relative: 0
Monocytes Relative: 6
Myelocytes: 0
Neutro Abs: 18.3 — ABNORMAL HIGH
Neutro Abs: 7.9 — ABNORMAL HIGH
Neutrophils Relative %: 82 — ABNORMAL HIGH
Neutrophils Relative %: 86 — ABNORMAL HIGH

## 2011-04-16 LAB — BASIC METABOLIC PANEL
CO2: 21
GFR calc non Af Amer: >60
Potassium: 3.8
Sodium: 134 — ABNORMAL LOW

## 2011-04-16 LAB — CULTURE, BLOOD (ROUTINE X 2)
Culture: NO GROWTH
Report Status: 6282008

## 2011-04-16 LAB — URINALYSIS, ROUTINE W REFLEX MICROSCOPIC: Specific Gravity, Urine: 1.025

## 2011-04-16 LAB — PREGNANCY, URINE: Preg Test, Ur: NEGATIVE

## 2012-11-08 ENCOUNTER — Telehealth: Payer: Self-pay | Admitting: Physician Assistant

## 2012-11-08 NOTE — Telephone Encounter (Signed)
Pt states she has sinus infection and wants to know if you can give her antibiotic..she has been doing afrin NS and mucinex and no relief.

## 2012-11-09 NOTE — Telephone Encounter (Signed)
Pt called and scheduled appt for tomorrow morning.

## 2012-11-09 NOTE — Telephone Encounter (Signed)
NTBS.

## 2012-11-10 ENCOUNTER — Encounter: Payer: Self-pay | Admitting: Family Medicine

## 2012-11-10 ENCOUNTER — Ambulatory Visit (INDEPENDENT_AMBULATORY_CARE_PROVIDER_SITE_OTHER): Payer: 59 | Admitting: Family Medicine

## 2012-11-10 VITALS — BP 120/72 | HR 100 | Temp 98.3°F | Resp 20

## 2012-11-10 DIAGNOSIS — J019 Acute sinusitis, unspecified: Secondary | ICD-10-CM

## 2012-11-10 MED ORDER — AMOXICILLIN-POT CLAVULANATE 875-125 MG PO TABS: 1.0000 | ORAL_TABLET | Freq: Two times a day (BID) | ORAL | Status: DC

## 2012-11-10 NOTE — Progress Notes (Signed)
  Subjective:    Patient ID: Grace Hawkins, female    DOB: December 31, 1975, 37 y.o.   MRN: [id number]  HPI  Patient had allergy symptoms for more than one month. This characterized by runny nose, itchy watery eyes, and sneezing. However beginning 4-5 days ago, she developed fever, maxillary sinus pain and pressure, headaches, postnasal drip, and generally feeling lousy.  She also is having increasing cough and some mild shortness of breath on exertion.  She has tried Nasonex, Mucinex D., and afrin without benefit. Past Medical History  Diagnosis Date  . Allergy    No current outpatient prescriptions on file prior to visit.   No current facility-administered medications on file prior to visit.   No Known Allergies   Review of Systems  All other systems reviewed and are negative.       Objective:   Physical Exam  Vitals reviewed. Constitutional: She appears well-developed and well-nourished.  HENT:  Head: Normocephalic.  Right Ear: External ear normal.  Left Ear: External ear normal.  Nose: Mucosal edema and rhinorrhea present. Right sinus exhibits maxillary sinus tenderness. Left sinus exhibits maxillary sinus tenderness.  Mouth/Throat: Oropharynx is clear and moist.  Neck: Neck supple.  Cardiovascular: Normal rate, regular rhythm and normal heart sounds.   No murmur heard. Pulmonary/Chest: Effort normal and breath sounds normal. No respiratory distress. She has no wheezes. She has no rales.  Lymphadenopathy:    She has no cervical adenopathy.          Assessment & Plan:  1. Acute rhinosinusitis Continue Nasonex, Mucinex, and over-the-counter Sudafed.  Prescribed Augmentin 875 mg by mouth 3 times a day for 10 days.  Also recommended nasal saline rinses 4 times a day. - amoxicillin-clavulanate (AUGMENTIN) 875-125 MG per tablet; Take 1 tablet by mouth 2 (two) times daily.  Dispense: 20 tablet; Refill: 0

## 2013-04-21 ENCOUNTER — Telehealth: Payer: Self-pay | Admitting: Family Medicine

## 2013-04-21 ENCOUNTER — Encounter: Payer: Self-pay | Admitting: Family Medicine

## 2013-04-21 MED ORDER — LEVOTHYROXINE SODIUM 200 MCG PO TABS: 200.0000 ug | ORAL_TABLET | Freq: Every day | ORAL | Status: DC

## 2013-04-21 MED ORDER — LEVOTHYROXINE SODIUM 75 MCG PO TABS: 75.0000 ug | ORAL_TABLET | Freq: Every day | ORAL | Status: DC

## 2013-04-21 NOTE — Telephone Encounter (Signed)
Levothyroxine 75 mcg and 200 mcg.  Medication refill for one time only.  Patient needs to be seen.  Letter sent for patient to call and schedule

## 2013-05-11 ENCOUNTER — Telehealth: Payer: Self-pay | Admitting: *Deleted

## 2013-06-16 ENCOUNTER — Other Ambulatory Visit: Payer: 59

## 2013-06-16 DIAGNOSIS — E039 Hypothyroidism, unspecified: Secondary | ICD-10-CM

## 2013-06-17 ENCOUNTER — Telehealth: Payer: Self-pay | Admitting: Family Medicine

## 2013-06-17 LAB — TSH: TSH: 8.826 u[IU]/mL — ABNORMAL HIGH (ref 0.350–4.500)

## 2013-06-17 MED ORDER — LEVOTHYROXINE SODIUM 200 MCG PO TABS: 200.0000 ug | ORAL_TABLET | Freq: Every day | ORAL | Status: DC

## 2013-06-17 MED ORDER — LEVOTHYROXINE SODIUM 75 MCG PO TABS: 75.0000 ug | ORAL_TABLET | Freq: Every day | ORAL | Status: DC

## 2013-06-17 NOTE — Telephone Encounter (Signed)
Rx Refilled  

## 2013-06-17 NOTE — Telephone Encounter (Signed)
Message copied by Ricard Dillon on Fri Jun 17, 2013 11:13 AM ------      Message from: [facility], Lindalou Hose      Created: Fri Jun 17, 2013  8:53 AM       She will need a 2 wk supply of Synthroid and      Called in to [facility] .  She has appt      07/13/2013       ------

## 2013-06-20 ENCOUNTER — Telehealth: Payer: Self-pay | Admitting: Family Medicine

## 2013-06-20 DIAGNOSIS — E039 Hypothyroidism, unspecified: Secondary | ICD-10-CM

## 2013-06-20 MED ORDER — LEVOTHYROXINE SODIUM 300 MCG PO TABS: 300.0000 ug | ORAL_TABLET | Freq: Every day | ORAL | Status: DC

## 2013-06-20 NOTE — Telephone Encounter (Signed)
Message copied by Donne Anon on Mon Jun 20, 2013  4:17 PM ------      Message from: Lynnea Ferrier      Created: Fri Jun 17, 2013 10:05 AM       I would increase levothyroxine to 300 mcg poqday and recheck tsh in 6 weeks. ------

## 2013-06-20 NOTE — Telephone Encounter (Signed)
Lmtcb.   RX to pharmacy and lab ordered

## 2013-07-13 ENCOUNTER — Ambulatory Visit: Payer: 59 | Admitting: Physician Assistant

## 2013-07-21 ENCOUNTER — Ambulatory Visit (INDEPENDENT_AMBULATORY_CARE_PROVIDER_SITE_OTHER): Payer: 59 | Admitting: Physician Assistant

## 2013-07-21 ENCOUNTER — Encounter: Payer: Self-pay | Admitting: Physician Assistant

## 2013-07-21 VITALS — BP 136/78 | HR 76 | Temp 98.4°F | Resp 18

## 2013-07-21 DIAGNOSIS — E039 Hypothyroidism, unspecified: Secondary | ICD-10-CM | POA: Insufficient documentation

## 2013-07-21 MED ORDER — LEVOTHYROXINE SODIUM 300 MCG PO TABS: 300.0000 ug | ORAL_TABLET | Freq: Every day | ORAL | Status: DC

## 2013-07-21 NOTE — Progress Notes (Signed)
    Patient ID: Grace Hawkins MRN: [id number], DOB: January 04, 1976, 38 y.o. Date of Encounter: 07/21/2013, 1:29 PM    Chief Complaint:  Chief Complaint  Patient presents with  . 6 mth check    need rf thyroid meds but just had labs but was told needed appt.     HPI: 38 y.o. year old female here for followup of her thyroid. She says in the past she was on brand name Synthroid. Then she changed to generic. Last TSH was 06/17/13 and was 8.826. Thyroid dose was increased to 300 mcg daily. She reports that it actually took her a few days after that before she got to the pharmacy to start the new dose. Says that she's probably only been on the new dose for about 3 weeks so needs to wait another 3 weeks to recheck the lab.  This point is continue generic. Says it continues to be abnormal then she will go back to brand name. Has been on thyroid medication for many many years.  She has no complaints. Has been feeling well. 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 Her husband works and what the police department. They also bounce house place.     Home Meds: See attached medication section for any medications that were entered at today's visit. The computer does not put those onto this list.The following list is a list of meds entered prior to today's visit.   No current outpatient prescriptions on file prior to visit.   No current facility-administered medications on file prior to visit.    Allergies: No Known Allergies    Review of Systems: See HPI for pertinent ROS. All other ROS negative.    Physical Exam: Blood pressure 136/78, pulse 76, temperature 98.4 F (36.9 C), temperature source Oral, resp. rate 18, weight 0 lb (0 kg), last menstrual period 06/30/2013., There is no height on file to calculate BMI. General: Severely obese white female. Appears in no acute distress. Neck: Supple. No thyromegaly. No lymphadenopathy. Lungs: Clear bilaterally to auscultation  without wheezes, rales, or rhonchi. Breathing is unlabored. Heart: Regular rhythm. No murmurs, rubs, or gallops. Msk:  Strength and tone normal for age. Extremities/Skin: Warm and dry. No clubbing or cyanosis. No edema. No rashes or suspicious lesions. Neuro: Alert and oriented X 3. Moves all extremities spontaneously. Gait is normal. CNII-XII grossly in tact. Psych:  Responds to questions appropriately with a normal affect.     ASSESSMENT AND PLAN:  38 y.o. year old female with  1. Hypothyroid  2. Unspecified hypothyroidism - levothyroxine (SYNTHROID, LEVOTHROID) 300 MCG tablet; Take 1 tablet (300 mcg total) by mouth daily before breakfast.  Dispense: 30 tablet; Refill: 5  She is aware that she is due to recheck TSH in 3 more weeks. I will place future order now. I told her I will go ahead and send in refills to last for 6 months but for her  to make sure that she follows up with any future needed labs as directed.  Murray HodgkinsSigned, Mary Beth SangareeDixon, [place]PA, [facility] CenterBS[facility] 07/21/2013 1:29 PM

## 2013-10-22 ENCOUNTER — Encounter (HOSPITAL_BASED_OUTPATIENT_CLINIC_OR_DEPARTMENT_OTHER): Payer: Self-pay | Admitting: Emergency Medicine

## 2013-10-22 ENCOUNTER — Emergency Department (HOSPITAL_BASED_OUTPATIENT_CLINIC_OR_DEPARTMENT_OTHER): Payer: 59

## 2013-10-22 ENCOUNTER — Emergency Department (HOSPITAL_BASED_OUTPATIENT_CLINIC_OR_DEPARTMENT_OTHER)
Admission: EM | Admit: 2013-10-22 | Discharge: 2013-10-23 | Disposition: A | Discharge status: Home / Self Care | Payer: 59 | Attending: Emergency Medicine | Admitting: Emergency Medicine

## 2013-10-22 DIAGNOSIS — Z79899 Other long term (current) drug therapy: Secondary | ICD-10-CM | POA: Insufficient documentation

## 2013-10-22 DIAGNOSIS — N12 Tubulo-interstitial nephritis, not specified as acute or chronic: Secondary | ICD-10-CM | POA: Insufficient documentation

## 2013-10-22 DIAGNOSIS — N898 Other specified noninflammatory disorders of vagina: Secondary | ICD-10-CM | POA: Insufficient documentation

## 2013-10-22 DIAGNOSIS — Z3202 Encounter for pregnancy test, result negative: Secondary | ICD-10-CM | POA: Insufficient documentation

## 2013-10-22 DIAGNOSIS — E039 Hypothyroidism, unspecified: Secondary | ICD-10-CM | POA: Insufficient documentation

## 2013-10-22 DIAGNOSIS — N1330 Unspecified hydronephrosis: Secondary | ICD-10-CM | POA: Insufficient documentation

## 2013-10-22 LAB — COMPREHENSIVE METABOLIC PANEL
ALBUMIN: 4.1 g/dL (ref 3.5–5.2)
ALT: 82 U/L — ABNORMAL HIGH (ref 0–35)
AST: 64 U/L — ABNORMAL HIGH (ref 0–37)
Alkaline Phosphatase: 63 U/L (ref 39–117)
BUN: 15 mg/dL (ref 6–23)
CO2: 25 mEq/L (ref 19–32)
CREATININE: 1.1 mg/dL (ref 0.50–1.10)
Calcium: 9.9 mg/dL (ref 8.4–10.5)
Chloride: 100 mEq/L (ref 96–112)
GFR calc Af Amer: 74 mL/min — ABNORMAL LOW (ref >90)
GFR, EST NON AFRICAN AMERICAN: 64 mL/min — AB (ref >90)
Glucose, Bld: 143 mg/dL — ABNORMAL HIGH (ref 70–99)
Potassium: 4.9 mEq/L (ref 3.7–5.3)
Sodium: 140 mEq/L (ref 137–147)
TOTAL PROTEIN: 8.3 g/dL (ref 6.0–8.3)
Total Bilirubin: 0.3 mg/dL (ref 0.3–1.2)

## 2013-10-22 LAB — CBC WITH DIFFERENTIAL/PLATELET
BASOS ABS: 0 10*3/uL (ref 0.0–0.1)
BASOS PCT: 0 % (ref 0–1)
EOS ABS: 0 10*3/uL (ref 0.0–0.7)
EOS PCT: 0 % (ref 0–5)
HEMATOCRIT: 40.5 % (ref 36.0–46.0)
Hemoglobin: 13 g/dL (ref 12.0–15.0)
Lymphocytes Relative: 6 % — ABNORMAL LOW (ref 12–46)
Lymphs Abs: 0.9 10*3/uL (ref 0.7–4.0)
MCH: 27.7 pg (ref 26.0–34.0)
MCHC: 32.1 g/dL (ref 30.0–36.0)
MCV: 86.4 fL (ref 78.0–100.0)
MONO ABS: 0.4 10*3/uL (ref 0.1–1.0)
Monocytes Relative: 3 % (ref 3–12)
Neutro Abs: 12.5 10*3/uL — ABNORMAL HIGH (ref 1.7–7.7)
Neutrophils Relative %: 90 % — ABNORMAL HIGH (ref 43–77)
PLATELETS: 286 10*3/uL (ref 150–400)
RBC: 4.69 MIL/uL (ref 3.87–5.11)
RDW: 15.8 % — AB (ref 11.5–15.5)
WBC: 13.8 10*3/uL — ABNORMAL HIGH (ref 4.0–10.5)

## 2013-10-22 LAB — URINALYSIS, ROUTINE W REFLEX MICROSCOPIC
Bilirubin Urine: NEGATIVE
Glucose, UA: NEGATIVE mg/dL
Ketones, ur: NEGATIVE mg/dL
Leukocytes, UA: NEGATIVE
Nitrite: NEGATIVE
Protein, ur: NEGATIVE mg/dL
Specific Gravity, Urine: 1.026 (ref 1.005–1.030)
UROBILINOGEN UA: 0.2 mg/dL (ref 0.0–1.0)
pH: 6 (ref 5.0–8.0)

## 2013-10-22 LAB — PREGNANCY, URINE: PREG TEST UR: NEGATIVE

## 2013-10-22 LAB — URINE MICROSCOPIC-ADD ON

## 2013-10-22 IMAGING — CT CT ABD-PELV W/ CM
2 of 5 series · 16 of 46 positions shown, 18 images · IV contrast (omnipaque)
Comparison: None.

CLINICAL DATA: Left lower abdominal pain, left flank pain

EXAM:
CT ABDOMEN AND PELVIS WITH CONTRAST
TECHNIQUE: Multidetector CT imaging of the abdomen and pelvis was performed
using the standard protocol following bolus administration of
intravenous contrast.
CONTRAST:  100mL OMNIPAQUE IOHEXOL 300 MG/ML SOLN, 50mL OMNIPAQUE
IOHEXOL 300 MG/ML SOLN

[Series 2: abd/pelvis 5.0 b31f · axial · 0.98mm/px · z∈[-510,-55]mm · 13 of 103 slices shown, 15 images]
[im 6/103  soft-tissue]
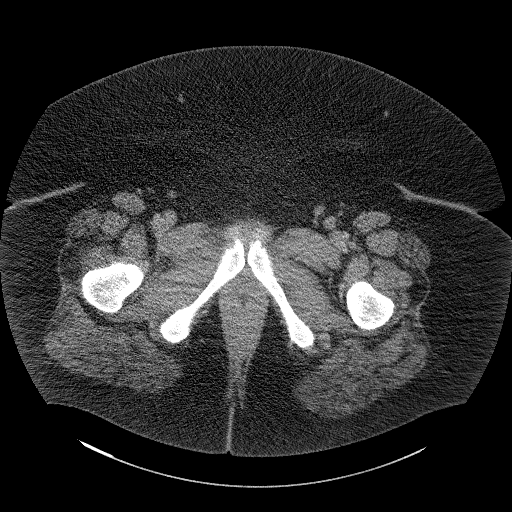
[im 6/103  bone]
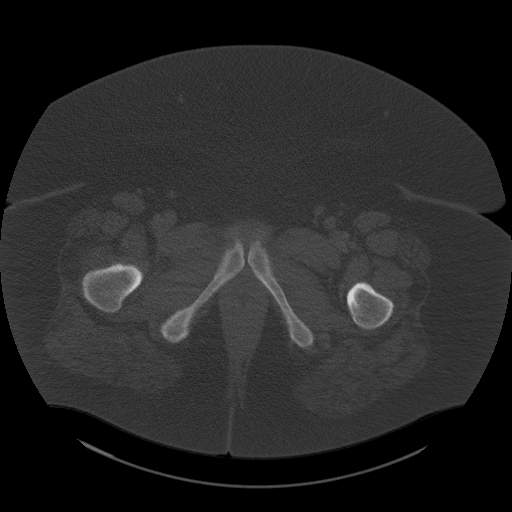
[im 12/103  soft-tissue]
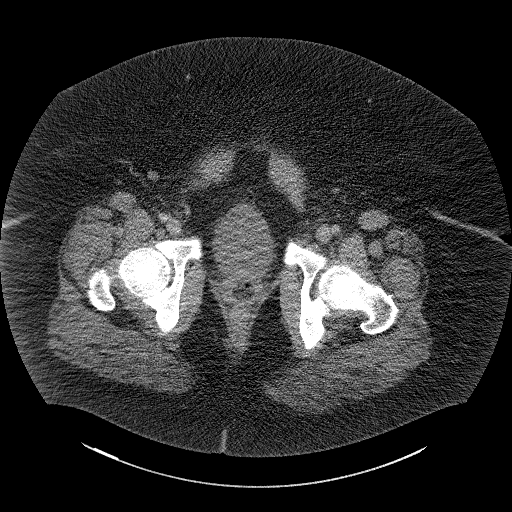
[im 23/103  soft-tissue]
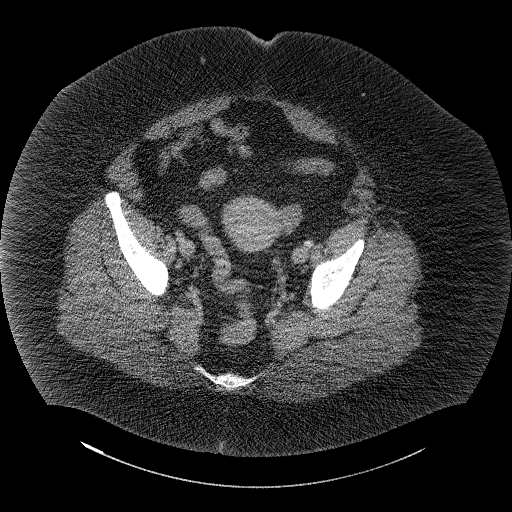
[im 29/103  soft-tissue]
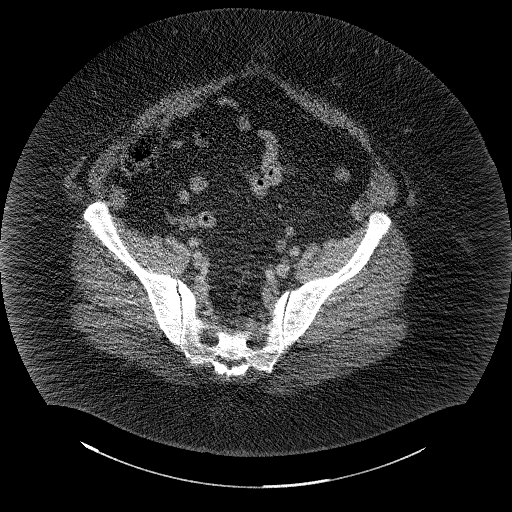
[im 35/103  soft-tissue]
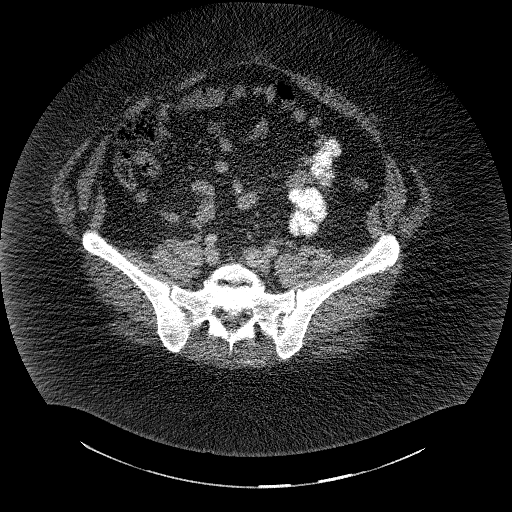
[im 46/103  soft-tissue]
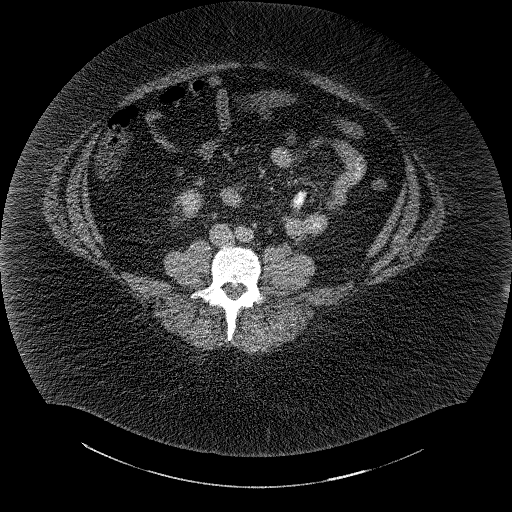
[im 52/103  soft-tissue]
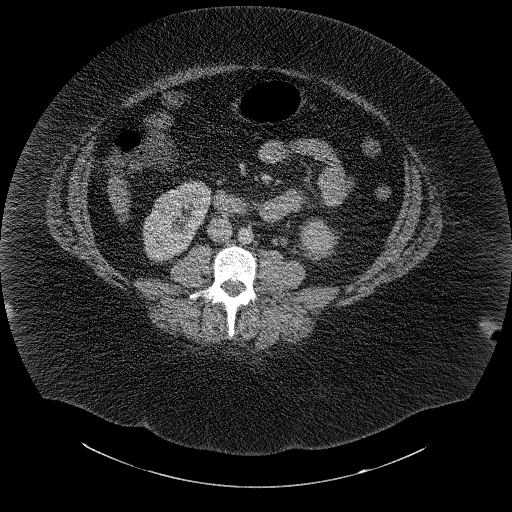
[im 57/103  soft-tissue]
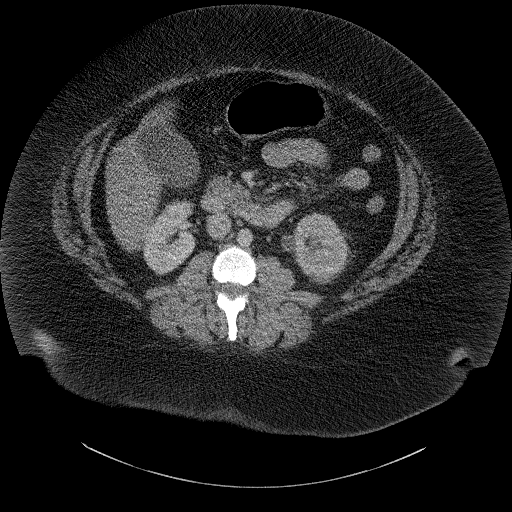
[im 69/103  soft-tissue]
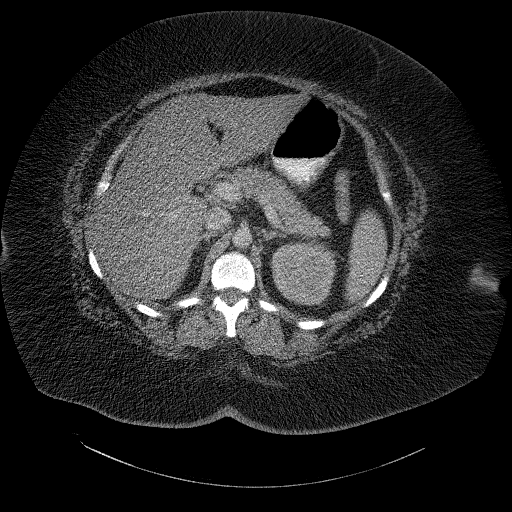
[im 69/103  bone]
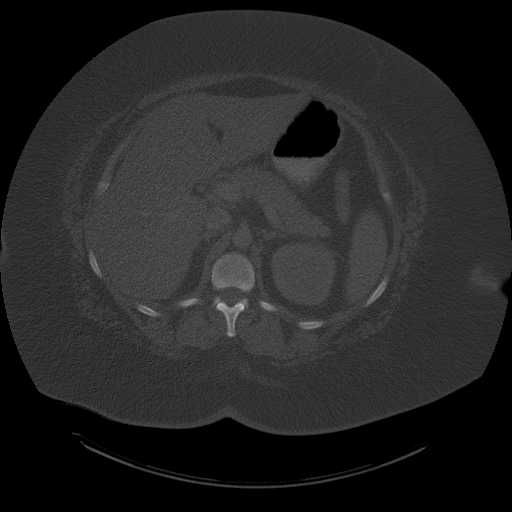
[im 74/103  soft-tissue]
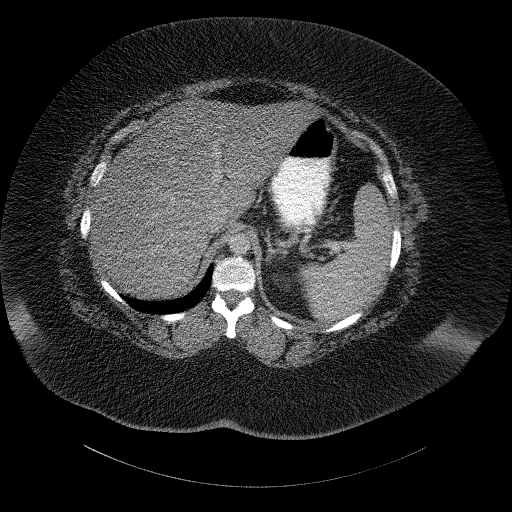
[im 80/103  soft-tissue]
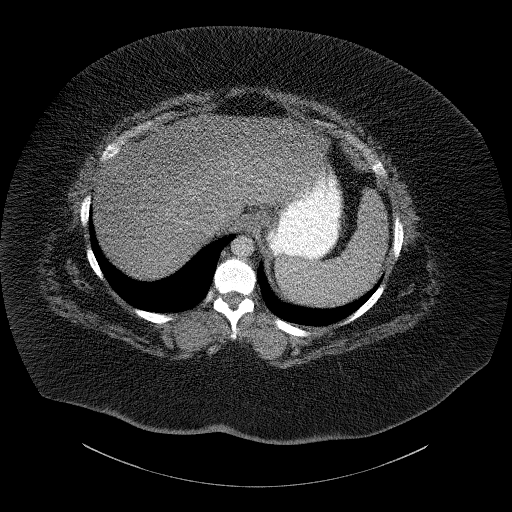
[im 91/103  soft-tissue]
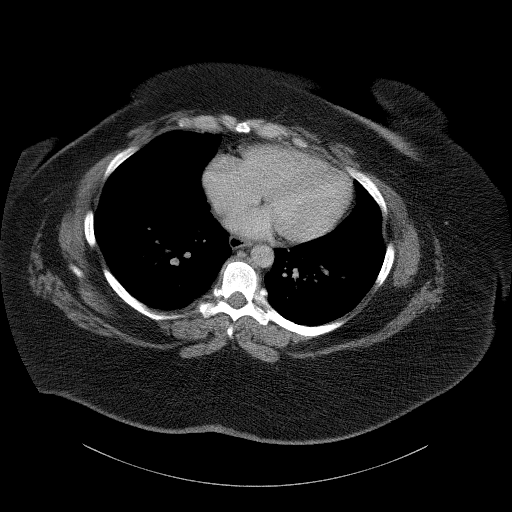
[im 97/103  soft-tissue]
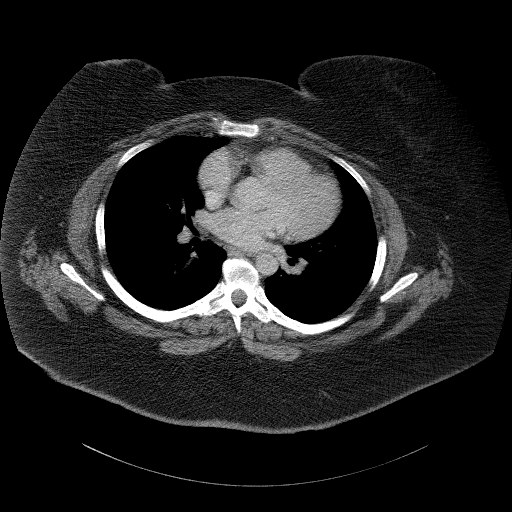

[Series 5: abd/pelvis 3.0 coronal · coronal · 1.07mm/px · 3 of 135 slices shown]
[im 45/135  soft-tissue]
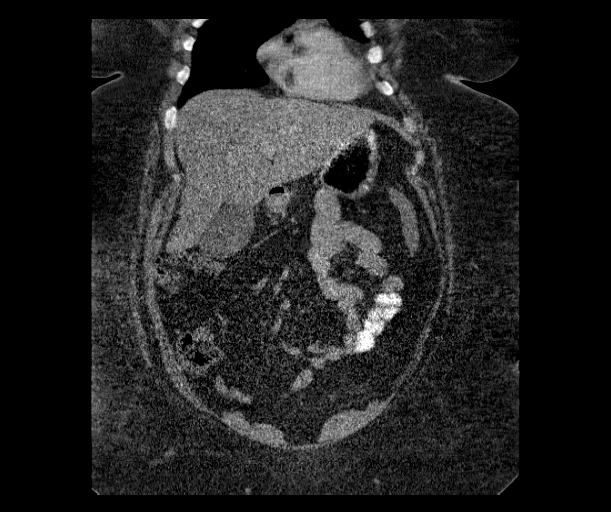
[im 60/135  soft-tissue]
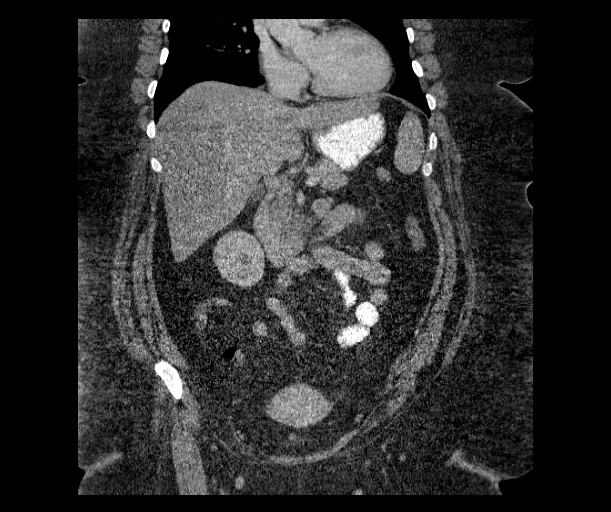
[im 75/135  soft-tissue]
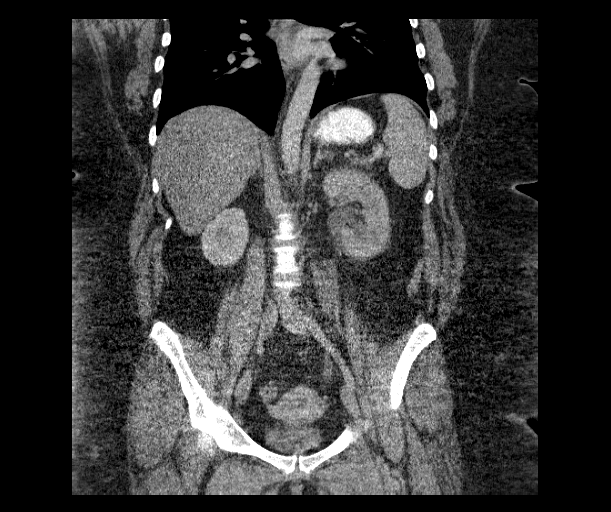

[16 of 46 positions shown; findings below may reference images not displayed]

FINDINGS: Lung bases are clear.  No pericardial fluid.

No focal hepatic lesion. The gallbladder, pancreas, spleen, adrenal
glands normal. Asymmetric enhancement of the kidneys with increased
perfusion to the right kidney compared to the left. There is mild
pelvic caliectasis and hydroureter on the left. There is no
obstructing lesion identified in the left ureter. There is some
limitation to the scan due the poor x-ray penetration of the pelvis
due to body habitus. The right kidney appears normal. No evidence of
bladder calculi. Again there is significant noise in the pelvis from
the body habitus.

Stomach, small bowel, appendix, and cecum are normal. The colon and
rectosigmoid colon are normal.

Abdominal aorta is normal caliber. No retroperitoneal periportal
lymphadenopathy. . No free fluid the pelvis. The bladder and uterus
appear normal. No aggressive osseous lesion.
IMPRESSION: Poorer perfusion to the left kidney compared to the right.
Differential includes decreased profusion secondary to
hydronephrosis / obstruction versus pyelonephritis. No obstructing
lesion identified in the left ureter although there is mild
hydroureter. Exam is somewhat limited due to patient body habitus.
No bladder calculi or renal calculi identified. Favor
pyelonephritis.

## 2013-10-22 MED ORDER — CIPROFLOXACIN HCL 500 MG PO TABS: 500.0000 mg | ORAL_TABLET | Freq: Two times a day (BID) | ORAL | Status: DC

## 2013-10-22 MED ORDER — HYDROMORPHONE HCL PF 1 MG/ML IJ SOLN
1.0000 mg | Freq: Once | INTRAMUSCULAR | Status: AC
  Administered 2013-10-22: 1 mg via INTRAVENOUS
  Filled 2013-10-22: qty 1

## 2013-10-22 MED ORDER — OXYCODONE-ACETAMINOPHEN 5-325 MG PO TABS: 1.0000 | ORAL_TABLET | ORAL | Status: DC | PRN

## 2013-10-22 MED ORDER — IOHEXOL 300 MG/ML  SOLN
50.0000 mL | Freq: Once | INTRAMUSCULAR | Status: AC | PRN
  Administered 2013-10-22: 50 mL via ORAL

## 2013-10-22 MED ORDER — ONDANSETRON HCL 4 MG/2ML IJ SOLN
4.0000 mg | Freq: Once | INTRAMUSCULAR | Status: AC
  Administered 2013-10-22: 4 mg via INTRAVENOUS
  Filled 2013-10-22: qty 2

## 2013-10-22 MED ORDER — HYDROMORPHONE HCL PF 1 MG/ML IJ SOLN
INTRAMUSCULAR | Status: AC
  Administered 2013-10-22: 1 mg via INTRAVENOUS
  Filled 2013-10-22: qty 1

## 2013-10-22 MED ORDER — IOHEXOL 300 MG/ML  SOLN
100.0000 mL | Freq: Once | INTRAMUSCULAR | Status: AC | PRN
  Administered 2013-10-22: 100 mL via INTRAVENOUS

## 2013-10-22 MED ORDER — ONDANSETRON 8 MG PO TBDP: 8.0000 mg | ORAL_TABLET | Freq: Three times a day (TID) | ORAL | Status: DC | PRN

## 2013-10-22 MED ORDER — ONDANSETRON HCL 4 MG/2ML IJ SOLN
INTRAMUSCULAR | Status: AC
  Administered 2013-10-22: 20:00:00 4 mg via INTRAVENOUS
  Filled 2013-10-22: qty 2

## 2013-10-22 MED ORDER — DEXTROSE 5 % IV SOLN
1.0000 g | INTRAVENOUS | Status: DC
  Administered 2013-10-22: 1 g via INTRAVENOUS

## 2013-10-22 MED ORDER — CEFTRIAXONE SODIUM 1 G IJ SOLR
INTRAMUSCULAR | Status: AC
  Filled 2013-10-22: qty 10

## 2013-10-22 NOTE — ED Notes (Signed)
Pt. C/o increase of pain. Rates pain 5/10. Elpidio AnisShari Upstill, PA aware and orders received. Medicated per order.

## 2013-10-22 NOTE — ED Notes (Signed)
Labs reviewed with provider. Pt condition remains stable and pain controlled, tolerating po contrast well at this time being prep for CT abd

## 2013-10-22 NOTE — ED Notes (Signed)
Transported to CT scan

## 2013-10-22 NOTE — ED Notes (Signed)
Pt having left lower abdominal pain radiating to left flank.   No known fever.  Some nausea.

## 2013-10-22 NOTE — Discharge Instructions (Signed)
Pyelonephritis, Adult °Pyelonephritis is a kidney infection. A kidney infection can happen quickly, or it can last for a long time. °HOME CARE  °· Take your medicine (antibiotics) as told. Finish it even if you start to feel better. °· Keep all doctor visits as told. °· Drink enough fluids to keep your pee (urine) clear or pale yellow. °· Only take medicine as told by your doctor. °GET HELP RIGHT AWAY IF:  °· You have a fever or lasting symptoms for more than 2-3 days. °· You have a fever and your symptoms suddenly get worse. °· You cannot take your medicine or drink fluids as told. °· You have chills and shaking. °· You feel very weak or pass out (faint). °· You do not feel better after 2 days. °MAKE SURE YOU: °· Understand these instructions. °· Will watch your condition. °· Will get help right away if you are not doing well or get worse. °Document Released: 07/24/2004 Document Revised: 12/16/2011 Document Reviewed: 12/04/2010 °ExitCare® Patient Information ©2014 [employer]. ° °

## 2013-10-22 NOTE — ED Provider Notes (Signed)
Medical screening examination/treatment/procedure(s) were conducted as a shared visit with non-physician practitioner(s) and myself.  I personally evaluated the patient during the encounter.   EKG Interpretation None      Pt with left flank pain, no vomiting.  CT shows left hydroureter, diminished perfusion to left kidney.  Consulted with urology, will start abx, close f/u with them.  Rolan BuccoMelanie Lurae Hornbrook, MD 10/22/13 [phone]

## 2013-10-22 NOTE — ED Provider Notes (Signed)
CSN: [id number]     Arrival date & time 10/22/13  1907 History   First MD Initiated Contact with Patient 10/22/13 1914     Chief Complaint  Patient presents with  . Abdominal Pain  . Flank Pain     (Consider location/radiation/quality/duration/timing/severity/associated sxs/prior Treatment) Patient is a 38 y.o. female presenting with abdominal pain and flank pain. The history is provided by the patient and the spouse. No language interpreter was used.  Abdominal Pain Pain location:  LLQ Pain quality: aching   Pain radiates to:  Does not radiate Pain severity:  Moderate Progression:  Worsening Associated symptoms: vaginal bleeding   Associated symptoms: no chest pain, no chills, no diarrhea, no dysuria, no fever, no hematuria, no nausea, no shortness of breath, no vaginal discharge and no vomiting   Associated symptoms comment:  LLQ abdominal pain since this morning. No fever. No nausea or vomiting. She denies dysuria, vaginal discharge. She reports the start of her menses this morning as per her regular timing. She had a normal, non-bloody, non-melanic stool this morning. The patient reports feeling like she had to move her bowels again throughout the day but has been unable.  Flank Pain Associated symptoms include abdominal pain. Pertinent negatives include no chest pain, chills, fever, nausea or vomiting.    Past Medical History  Diagnosis Date  . Allergy   . Hypothyroid    No past surgical history on file. No family history on file. History  Substance Use Topics  . Smoking status: Never Smoker   . Smokeless tobacco: Never Used  . Alcohol Use: No   OB History   Grav Para Term Preterm Abortions TAB SAB Ect Mult Living                 Review of Systems  Constitutional: Negative for fever and chills.  Respiratory: Negative.  Negative for shortness of breath.   Cardiovascular: Negative.  Negative for chest pain.  Gastrointestinal: Positive for abdominal pain. Negative for  nausea, vomiting and diarrhea.  Genitourinary: Positive for flank pain and vaginal bleeding. Negative for dysuria, hematuria, vaginal discharge and pelvic pain.  Musculoskeletal: Negative.  Negative for back pain.  Skin: Negative.   Neurological: Negative.       Allergies  Review of patient's allergies indicates no known allergies.  Home Medications   Prior to Admission medications   Medication Sig Start Date End Date Taking? Authorizing Provider  levothyroxine (SYNTHROID, LEVOTHROID) 300 MCG tablet Take 1 tablet (300 mcg total) by mouth daily before breakfast. 07/21/13   Dorena BodoMary B Dixon, PA-C   BP 160/117  Pulse 91  Temp(Src) 98.2 F (36.8 C) (Oral)  Resp 16  Ht 5\' 8"  (1.727 m)  LMP 10/22/2013 Physical Exam  Constitutional: She is oriented to person, place, and time. She appears well-developed and well-nourished.  HENT:  Head: Normocephalic.  Neck: Normal range of motion. Neck supple.  Cardiovascular: Normal rate and regular rhythm.   Pulmonary/Chest: Effort normal and breath sounds normal.  Abdominal: Soft. Bowel sounds are normal. There is tenderness. There is no rebound and no guarding.  LLQ tenderness to palpation. Soft abdomen.  Musculoskeletal: Normal range of motion.  Neurological: She is alert and oriented to person, place, and time.  Skin: Skin is warm and dry. No rash noted.  Psychiatric: She has a normal mood and affect.    ED Course  Procedures (including critical care time) Labs Review Labs Reviewed  CBC WITH DIFFERENTIAL - Abnormal; Notable for the following:  WBC 13.8 (*)    RDW 15.8 (*)    Neutrophils Relative % 90 (*)    Neutro Abs 12.5 (*)    Lymphocytes Relative 6 (*)    All other components within normal limits  COMPREHENSIVE METABOLIC PANEL - Abnormal; Notable for the following:    Glucose, Bld 143 (*)    AST 64 (*)    ALT 82 (*)    GFR calc non Af Amer 64 (*)    GFR calc Af Amer 74 (*)    All other components within normal limits   URINALYSIS, ROUTINE W REFLEX MICROSCOPIC - Abnormal; Notable for the following:    APPearance CLOUDY (*)    Hgb urine dipstick SMALL (*)    All other components within normal limits  URINE MICROSCOPIC-ADD ON - Abnormal; Notable for the following:    Squamous Epithelial / LPF FEW (*)    Bacteria, UA MANY (*)    All other components within normal limits  PREGNANCY, URINE   Results for orders placed during the hospital encounter of 10/22/13  CBC WITH DIFFERENTIAL      Result Value Ref Range   WBC 13.8 (*) 4.0 - 10.5 K/uL   RBC 4.69  3.87 - 5.11 MIL/uL   Hemoglobin 13.0  12.0 - 15.0 g/dL   HCT 16.140.5  09.636.0 - 04.546.0 %   MCV 86.4  78.0 - 100.0 fL   MCH 27.7  26.0 - 34.0 pg   MCHC 32.1  30.0 - 36.0 g/dL   RDW 40.915.8 (*) 81.111.5 - 91.415.5 %   Platelets 286  150 - 400 K/uL   Neutrophils Relative % 90 (*) 43 - 77 %   Neutro Abs 12.5 (*) 1.7 - 7.7 K/uL   Lymphocytes Relative 6 (*) 12 - 46 %   Lymphs Abs 0.9  0.7 - 4.0 K/uL   Monocytes Relative 3  3 - 12 %   Monocytes Absolute 0.4  0.1 - 1.0 K/uL   Eosinophils Relative 0  0 - 5 %   Eosinophils Absolute 0.0  0.0 - 0.7 K/uL   Basophils Relative 0  0 - 1 %   Basophils Absolute 0.0  0.0 - 0.1 K/uL  COMPREHENSIVE METABOLIC PANEL      Result Value Ref Range   Sodium 140  137 - 147 mEq/L   Potassium 4.9  3.7 - 5.3 mEq/L   Chloride 100  96 - 112 mEq/L   CO2 25  19 - 32 mEq/L   Glucose, Bld 143 (*) 70 - 99 mg/dL   BUN 15  6 - 23 mg/dL   Creatinine, Ser 7.821.10  0.50 - 1.10 mg/dL   Calcium 9.9  8.4 - 95.610.5 mg/dL   Total Protein 8.3  6.0 - 8.3 g/dL   Albumin 4.1  3.5 - 5.2 g/dL   AST 64 (*) 0 - 37 U/L   ALT 82 (*) 0 - 35 U/L   Alkaline Phosphatase 63  39 - 117 U/L   Total Bilirubin 0.3  0.3 - 1.2 mg/dL   GFR calc non Af Amer 64 (*) >90 mL/min   GFR calc Af Amer 74 (*) >90 mL/min  URINALYSIS, ROUTINE W REFLEX MICROSCOPIC      Result Value Ref Range   Color, Urine YELLOW  YELLOW   APPearance CLOUDY (*) CLEAR   Specific Gravity, Urine 1.026  1.005 -  1.030   pH 6.0  5.0 - 8.0   Glucose, UA NEGATIVE  NEGATIVE mg/dL  Hgb urine dipstick SMALL (*) NEGATIVE   Bilirubin Urine NEGATIVE  NEGATIVE   Ketones, ur NEGATIVE  NEGATIVE mg/dL   Protein, ur NEGATIVE  NEGATIVE mg/dL   Urobilinogen, UA 0.2  0.0 - 1.0 mg/dL   Nitrite NEGATIVE  NEGATIVE   Leukocytes, UA NEGATIVE  NEGATIVE  PREGNANCY, URINE      Result Value Ref Range   Preg Test, Ur NEGATIVE  NEGATIVE  URINE MICROSCOPIC-ADD ON      Result Value Ref Range   Squamous Epithelial / LPF FEW (*) RARE   RBC / HPF 0-2  <3 RBC/hpf   Bacteria, UA MANY (*) RARE   Ct Abdomen Pelvis W Contrast  10/22/2013   CLINICAL DATA:  Left lower abdominal pain, left flank pain  EXAM: CT ABDOMEN AND PELVIS WITH CONTRAST  TECHNIQUE: Multidetector CT imaging of the abdomen and pelvis was performed using the standard protocol following bolus administration of intravenous contrast.  CONTRAST:  OMNIPAQUE IOHEXOL 300 MG/ML SOLN, 50mL OMNIPAQUE IOHEXOL 300 MG/ML SOLN  COMPARISON:  None.  FINDINGS: Lung bases are clear.  No pericardial fluid.  No focal hepatic lesion. The gallbladder, pancreas, spleen, adrenal glands normal. Asymmetric enhancement of the kidneys with increased perfusion to the right kidney compared to the left. There is mild pelvic caliectasis and hydroureter on the left. There is no obstructing lesion identified in the left ureter. There is some limitation to the scan due the poor x-ray penetration of the pelvis due to body habitus. The right kidney appears normal. No evidence of bladder calculi. Again there is significant noise in the pelvis from the body habitus.  Stomach, small bowel, appendix, and cecum are normal. The colon and rectosigmoid colon are normal.  Abdominal aorta is normal caliber. No retroperitoneal periportal lymphadenopathy. . No free fluid the pelvis. The bladder and uterus appear normal. No aggressive osseous lesion.  IMPRESSION: Poorer perfusion to the left kidney compared to the  right. Differential includes decreased profusion secondary to hydronephrosis / obstruction versus pyelonephritis. No obstructing lesion identified in the left ureter although there is mild hydroureter. Exam is somewhat limited due to patient body habitus. No bladder calculi or renal calculi identified. Favor pyelonephritis.   Electronically Signed   By: Genevive Bi M.D.   On: 10/22/2013 21:20    Imaging Review No results found.   EKG Interpretation None      MDM   Final diagnoses:  None    1. Pyelonephritis 2. Hydroureter  Findings on CT scan suggest hypoperfusion to left kidney with differential that includes hypoperfusion secondary to obstruction - with hydroureter consider possible nonvisualized stone - vs pyelonephritis. Discussed findings with Dr. Annabell Howells, urologist. Suggested treating with antibiotics for pyelonephritis and urology follow up early next week to re-evaluate. Patient is comfortable with care plan and is ready to go home. Strict return precautions given.    Arnoldo Hooker, PA-C 10/22/13 2219

## 2013-11-23 ENCOUNTER — Encounter: Payer: Self-pay | Admitting: Physician Assistant

## 2013-11-23 ENCOUNTER — Ambulatory Visit (INDEPENDENT_AMBULATORY_CARE_PROVIDER_SITE_OTHER): Payer: 59 | Admitting: Physician Assistant

## 2013-11-23 VITALS — BP 126/70 | HR 84 | Temp 98.1°F | Resp 18

## 2013-11-23 DIAGNOSIS — N12 Tubulo-interstitial nephritis, not specified as acute or chronic: Secondary | ICD-10-CM | POA: Insufficient documentation

## 2013-11-23 DIAGNOSIS — R300 Dysuria: Secondary | ICD-10-CM

## 2013-11-23 DIAGNOSIS — R945 Abnormal results of liver function studies: Secondary | ICD-10-CM

## 2013-11-23 DIAGNOSIS — E039 Hypothyroidism, unspecified: Secondary | ICD-10-CM

## 2013-11-23 DIAGNOSIS — R7989 Other specified abnormal findings of blood chemistry: Secondary | ICD-10-CM

## 2013-11-23 HISTORY — DX: Tubulo-interstitial nephritis, not specified as acute or chronic: N12

## 2013-11-23 HISTORY — DX: Other specified abnormal findings of blood chemistry: R79.89

## 2013-11-23 LAB — URINALYSIS, ROUTINE W REFLEX MICROSCOPIC
BILIRUBIN URINE: NEGATIVE
GLUCOSE, UA: NEGATIVE mg/dL
HGB URINE DIPSTICK: NEGATIVE
Ketones, ur: NEGATIVE mg/dL
LEUKOCYTES UA: NEGATIVE
Nitrite: NEGATIVE
Protein, ur: NEGATIVE mg/dL
Specific Gravity, Urine: 1.02 (ref 1.005–1.030)
Urobilinogen, UA: 0.2 mg/dL (ref 0.0–1.0)
pH: 5.5 (ref 5.0–8.0)

## 2013-11-23 LAB — COMPLETE METABOLIC PANEL WITH GFR
ALT: 56 U/L — ABNORMAL HIGH (ref 0–35)
AST: 36 U/L (ref 0–37)
Albumin: 3.5 g/dL (ref 3.5–5.2)
Alkaline Phosphatase: 44 U/L (ref 39–117)
BUN: 10 mg/dL (ref 6–23)
CO2: 25 meq/L (ref 19–32)
Calcium: 8.6 mg/dL (ref 8.4–10.5)
Chloride: 103 mEq/L (ref 96–112)
Creat: 0.9 mg/dL (ref 0.50–1.10)
GFR, EST NON AFRICAN AMERICAN: 83 mL/min
GLUCOSE: 133 mg/dL — AB (ref 70–99)
POTASSIUM: 4 meq/L (ref 3.5–5.3)
Sodium: 137 mEq/L (ref 135–145)
TOTAL PROTEIN: 6.4 g/dL (ref 6.0–8.3)
Total Bilirubin: 0.4 mg/dL (ref 0.2–1.2)

## 2013-11-23 LAB — TSH: TSH: 6.893 u[IU]/mL — ABNORMAL HIGH (ref 0.350–4.500)

## 2013-11-23 NOTE — Progress Notes (Signed)
Patient ID: Grace Hawkins MRN: [id number], DOB: 10/02/1975, 38 y.o. Date of Encounter: @DATE @  Chief Complaint:  Chief Complaint  Patient presents with  . follow up elevated LFT's    check thyroid  . burning with urination    HPI: 38 y.o. year old white female  presents with above.   I know her from the past--she works as a Engineer, civil (consulting) in the ER. As well she has seen me here for routine office visits.  She states that 36 weeks ago she went to the [facility] in [facility]. Says that they did a CT scan. Was told that she had pyelonephritis and probably had just recently passed a kidney stone. Says that she saw urology for followup. They said that her creatinine was elevated but that this was to be expected and felt that it was secondary to the recent kidney stone.  Says that she is scheduled for ultrasound and nuclear test on the kidney in July.  She says that at the time of that evaluation she was found to have elevated LFTs as an incidental finding.  She was told to see her primary care provider to recheck LFTs.  Today she wants to have follow up kidney function to make sure that is improved. Also needs to check followup LFTs. As well she says that she's had a little bit of burning with urination for the past couple of days and wanted to make sure that she has no urine infection or signs of residual infection from recent pyelonephritis .  She also says that she is due to check her thyroid. Says in the past she was on brand name Synthroid and changed to generic. Says that since being on the generic she has noticed some changes and wants to go back to the brand name Synthroid once we get these lab results.   Past Medical History  Diagnosis Date  . Allergy   . Hypothyroid      Home Meds: Outpatient Prescriptions Prior to Visit  Medication Sig Dispense Refill  . levothyroxine (SYNTHROID, LEVOTHROID) 300 MCG tablet Take 1 tablet (300 mcg total) by mouth daily before breakfast.  30  tablet  5  . ciprofloxacin (CIPRO) 500 MG tablet Take 1 tablet (500 mg total) by mouth 2 (two) times daily.  20 tablet  0  . ondansetron (ZOFRAN ODT) 8 MG disintegrating tablet Take 1 tablet (8 mg total) by mouth every 8 (eight) hours as needed for nausea or vomiting.  20 tablet  0  . oxyCODONE-acetaminophen (PERCOCET/ROXICET) 5-325 MG per tablet Take 1-2 tablets by mouth every 4 (four) hours as needed for severe pain.  25 tablet  0   No facility-administered medications prior to visit.    Allergies: No Known Allergies  History   Social History  . Marital Status: Married    Spouse Name: N/A    Number of Children: N/A  . Years of Education: N/A   Occupational History  . Not on file.   Social History Main Topics  . Smoking status: Never Smoker   . Smokeless tobacco: Never Used  . Alcohol Use: No  . Drug Use: No  . Sexual Activity: Not on file   Other Topics Concern  . Not on file   Social History Narrative  . No narrative on file    No family history on file.   Review of Systems:  See HPI for pertinent ROS. All other ROS negative.    Physical Exam: Blood pressure  126/70, pulse 84, temperature 98.1 F (36.7 C), temperature source Oral, resp. rate 18, weight 0 lb (0 kg)., Body mass index is 0.00 kg/(m^2). General: Severely Obese WF. Appears in no acute distress. Neck: Supple. No thyromegaly. No lymphadenopathy. Lungs: Clear bilaterally to auscultation without wheezes, rales, or rhonchi. Breathing is unlabored. Heart: RRR with S1 S2. No murmurs, rubs, or gallops. Musculoskeletal:  Strength and tone normal for age. Extremities/Skin: Warm and dry.  No edema.  Neuro: Alert and oriented X 3. Moves all extremities spontaneously. Gait is normal. CNII-XII grossly in tact. Psych:  Responds to questions appropriately with a normal affect.     ASSESSMENT AND PLAN:  38 y.o. year old female with  1.S/P Recent  Pyelonephritis - Urine culture - COMPLETE METABOLIC PANEL WITH  GFR  2.S/P  Elevated liver function tests Recheck now - COMPLETE METABOLIC PANEL WITH GFR  3. Burning with urination Discussed with her that her urinalysis does not show any signs of infection. Will send culture. - Urinalysis, Routine w reflex microscopic - Urine culture  4. Hypothyroid She is currently taking her thyroid medication as prescribed. Recheck TSH. Once we get these results she wants us to send refills --wants to go  back to the brand name Synthroid. - TSH  She will need routine followup office visit and labs every 6 months or sooner if needed.   [address] W[address]igned, Mary Beth RedwayDixon, [place]PA, [facility] 11/23/2013 1:57 PM

## 2013-11-24 ENCOUNTER — Telehealth: Payer: Self-pay | Admitting: Family Medicine

## 2013-11-24 DIAGNOSIS — E039 Hypothyroidism, unspecified: Secondary | ICD-10-CM

## 2013-11-24 LAB — URINE CULTURE
Colony Count: NO GROWTH
Organism ID, Bacteria: NO GROWTH

## 2013-11-24 MED ORDER — LEVOTHYROXINE SODIUM 25 MCG PO TABS: 25.0000 ug | ORAL_TABLET | Freq: Every day | ORAL | Status: DC

## 2013-11-24 MED ORDER — SYNTHROID 300 MCG PO TABS: 300.0000 ug | ORAL_TABLET | Freq: Every day | ORAL | Status: DC

## 2013-11-24 NOTE — Telephone Encounter (Signed)
New Rx to pharmacy and repeat lab ordered. Have left message for patient to call me back

## 2013-11-24 NOTE — Telephone Encounter (Signed)
Message copied by Donne Anon on Thu Nov 24, 2013 11:46 AM ------      Message from: Allayne Butcher      Created: Thu Nov 24, 2013  7:38 AM       TSH is slightly high. Need to increase current dose slightly. Currently she is on 300 mcg daily. Medication does not come in a 325 dose. She will need to take the 300 mcg pill in addition to a 25 mcg pill. Also, at her visit yesterday she said she had been using generic medicine but wanted to change back to brand name Synthroid once we get these results.      Send preption for brand name Synthroid 300 mcg 1 by mouth daily. MUST MARK IT DISPENSE AS WRITTEN OR IT WILL BE CHANGED TO GENERIC      Also send prescription for brand name Synthroid 25 mcg one by mouth daily.  Mark dispense as written for her will be changed to generic.       Tell her to ck TSH in 6 weeks. Place  future order.            Also, I rechecked LFTs because she reported they have been high at the recent ER visit. Tell her that the LFTs are now back to normal.      Also, I rechecked kidney numbers as she said that those have been high at recent ER visit. Tell her kidney numbers are normal.      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 ------

## 2013-11-24 NOTE — Telephone Encounter (Signed)
Patient returned call and was made aware.

## 2013-12-28 ENCOUNTER — Other Ambulatory Visit (HOSPITAL_COMMUNITY): Payer: Self-pay | Admitting: Urology

## 2013-12-28 DIAGNOSIS — N1339 Other hydronephrosis: Secondary | ICD-10-CM

## 2014-01-24 ENCOUNTER — Ambulatory Visit (HOSPITAL_COMMUNITY): Admission: RE | Admit: 2014-01-24 | Payer: 59 | Source: Ambulatory Visit

## 2014-01-24 ENCOUNTER — Encounter: Payer: Self-pay | Admitting: Physician Assistant

## 2014-01-25 MED ORDER — LEVOTHYROXINE SODIUM 25 MCG PO TABS: 25.0000 ug | ORAL_TABLET | Freq: Every day | ORAL | Status: DC

## 2014-01-25 MED ORDER — SYNTHROID 300 MCG PO TABS: 300.0000 ug | ORAL_TABLET | Freq: Every day | ORAL | Status: DC

## 2014-01-31 ENCOUNTER — Ambulatory Visit (HOSPITAL_COMMUNITY): Payer: 59

## 2014-02-21 ENCOUNTER — Ambulatory Visit (HOSPITAL_COMMUNITY)
Admission: RE | Admit: 2014-02-21 | Discharge: 2014-02-21 | Disposition: A | Discharge status: Home / Self Care | Payer: 59 | Source: Ambulatory Visit | Attending: Urology | Admitting: Urology

## 2014-02-21 DIAGNOSIS — N1330 Unspecified hydronephrosis: Secondary | ICD-10-CM | POA: Diagnosis not present

## 2014-02-21 DIAGNOSIS — N1339 Other hydronephrosis: Secondary | ICD-10-CM

## 2014-02-21 IMAGING — NM NM RENAL IMAGING FLOW W/ PHARM
2 series · 12 of 12 positions shown · non-contrast
Comparison: None.

CLINICAL DATA: Left hydronephrosis

EXAM:
NUCLEAR MEDICINE RENAL SCAN WITH DIURETIC ADMINISTRATION
TECHNIQUE: Radionuclide angiographic and sequential renal images were obtained
after intravenous injection of radiopharmaceutical. Imaging was
continued during slow intravenous injection of Lasix approximately
15 minutes after the start of the examination.
RADIOPHARMACEUTICALS:  15.0 CT [DATE]

[Series 1: re renal qualitative · 9.51mm/px · 6 of 130 frames shown (1 of 2)]
[frame 11/130]
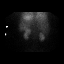
[frame 33/130]
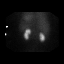
[frame 55/130]
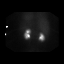
[frame 76/130]
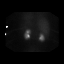
[frame 98/130]
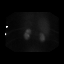
[frame 120/130]
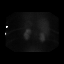

[Series 1: re renal qualitative · 9.51mm/px · 6 of 130 frames shown (2 of 2)]
[frame 11/130]
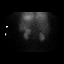
[frame 33/130]
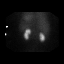
[frame 55/130]
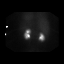
[frame 76/130]
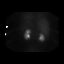
[frame 98/130]
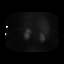
[frame 120/130]
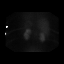

[12 of 12 positions shown; findings below may reference images not displayed]

FINDINGS: Flow:  Prompt symmetric arterial flow to the kidneys.

Left renogram: Normal renal cortical uptake. Normal secretion of
counts in the collecting system and clearance of counts from the
collecting system prior to administration of Lasix. No postvoid
residual.

Right renogram: Normal renal cortical uptake. Normal secretion
counts collecting system comparison counts from the collecting
system prior to administration of Lasix. No postvoid residual.

Differential:

Left kidney = 57.7 %

Right kidney = 42.3 %

T1/2 post Lasix :

Left kidney = 15.5 min (counts clear near completely prior to
administration of Lasix)

Right kidney = 14.5 min (counts clear near completely prior to
administration of Lasix)
IMPRESSION: 1. No evidence of left hydronephrosis or obstruction.
2. Normal right kidney.

## 2014-02-21 MED ORDER — TECHNETIUM TC 99M MERTIATIDE
15.0000 | Freq: Once | INTRAVENOUS | Status: AC | PRN
  Administered 2014-02-21: 15 via INTRAVENOUS

## 2014-02-21 MED ORDER — FUROSEMIDE 10 MG/ML IJ SOLN
60.0000 mg | Freq: Once | INTRAMUSCULAR | Status: AC
  Administered 2014-02-21: 60 mg via INTRAVENOUS
  Filled 2014-02-21: qty 6

## 2014-03-02 ENCOUNTER — Encounter: Payer: Self-pay | Admitting: Physician Assistant

## 2014-03-09 ENCOUNTER — Ambulatory Visit (INDEPENDENT_AMBULATORY_CARE_PROVIDER_SITE_OTHER): Payer: 59 | Admitting: Family Medicine

## 2014-03-09 ENCOUNTER — Other Ambulatory Visit: Payer: 59

## 2014-03-09 ENCOUNTER — Other Ambulatory Visit: Payer: Self-pay | Admitting: Family Medicine

## 2014-03-09 ENCOUNTER — Encounter: Payer: Self-pay | Admitting: Family Medicine

## 2014-03-09 VITALS — BP 116/78 | HR 78 | Temp 98.4°F | Resp 18

## 2014-03-09 DIAGNOSIS — E039 Hypothyroidism, unspecified: Secondary | ICD-10-CM

## 2014-03-09 DIAGNOSIS — R05 Cough: Secondary | ICD-10-CM

## 2014-03-09 DIAGNOSIS — R059 Cough, unspecified: Secondary | ICD-10-CM

## 2014-03-09 MED ORDER — HYDROCODONE-HOMATROPINE 5-1.5 MG/5ML PO SYRP: 5.0000 mL | ORAL_SOLUTION | Freq: Three times a day (TID) | ORAL | Status: DC | PRN

## 2014-03-09 MED ORDER — FLUCONAZOLE 150 MG PO TABS: 150.0000 mg | ORAL_TABLET | Freq: Once | ORAL | Status: DC

## 2014-03-09 MED ORDER — AZITHROMYCIN 250 MG PO TABS: ORAL_TABLET | ORAL | Status: DC

## 2014-03-09 NOTE — Progress Notes (Signed)
   Subjective:    Patient ID: Grace Hawkins, female    DOB: [birth year], 38 y.o.   MRN: [id number]  HPI  Patient's entire family has recently been diagnosed with bronchitis.  However after successive rounds of treatment none of them are improving. Each of them has a nonproductive cough that comes in spells and spasms. At times the spasms are severe and they cannot stop. The cough is nonproductive. The patient is wheezing slightly on examination. She denies any fevers or chills. She does have some postnasal drip and rhinorrhea. She also has a sore throat from all the coughing. She lives in [place] and go there have been cases of whooping cough recently and she also works in an emergency room. She's been treated with amoxicillin for strep throat. Her kids been treated with prednisone and amoxicillin by the pediatrician. Her husband has been treated with azithromycin. All of them continue to have a cough. Past Medical History  Diagnosis Date  . Allergy   . Hypothyroid    No past surgical history on file. Current Outpatient Prescriptions on File Prior to Visit  Medication Sig Dispense Refill  . levothyroxine (SYNTHROID, LEVOTHROID) 25 MCG tablet Take 1 tablet (25 mcg total) by mouth daily before breakfast.  30 tablet  1  . SYNTHROID 300 MCG tablet Take 1 tablet (300 mcg total) by mouth daily before breakfast.  30 tablet  1   No current facility-administered medications on file prior to visit.   No Known Allergies History   Social History  . Marital Status: Married    Spouse Name: N/A    Number of Children: N/A  . Years of Education: N/A   Occupational History  . Not on file.   Social History Main Topics  . Smoking status: Never Smoker   . Smokeless tobacco: Never Used  . Alcohol Use: No  . Drug Use: No  . Sexual Activity: Not on file   Other Topics Concern  . Not on file   Social History Narrative  . No narrative on file      Review of Systems  All other systems reviewed  and are negative.      Objective:   Physical Exam  Vitals reviewed. HENT:  Right Ear: External ear normal.  Left Ear: External ear normal.  Nose: Nose normal.  Mouth/Throat: Oropharynx is clear and moist. No oropharyngeal exudate.  Neck: Neck supple.  Cardiovascular: Normal rate, regular rhythm and normal heart sounds.   No murmur heard. Pulmonary/Chest: Effort normal. No respiratory distress. She has wheezes. She has no rales. She exhibits no tenderness.  Lymphadenopathy:    She has no cervical adenopathy.          Assessment & Plan:  Cough - Plan: Bordetella Pertussis PCR, azithromycin (ZITHROMAX) 250 MG tablet, HYDROcodone-homatropine (HYCODAN) 5-1.5 MG/5ML syrup  I believe the patient likely has bronchitis with reactive airway disease. It is possible that this is just a virus. However given her exposures and severity which she is having a cough as I witnessed today on her exam I am concerned about whooping cough.  I will start the patient on a Z-Pak to cover for possible pertussis and decrease her contagiousness.  Inpatient Hycodan 1 teaspoon every 8 hours as needed for cough. She can continue to take Sudafed Mucinex as needed for symptoms. I also performed a pertussis PCR nasal swab to evaluate for whooping cough.

## 2014-03-10 LAB — TSH: TSH: 0.41 u[IU]/mL (ref 0.350–4.500)

## 2014-03-13 ENCOUNTER — Ambulatory Visit: Payer: 59 | Admitting: Family Medicine

## 2014-03-14 ENCOUNTER — Other Ambulatory Visit: Payer: Self-pay | Admitting: *Deleted

## 2014-03-17 ENCOUNTER — Encounter: Payer: Self-pay | Admitting: Family Medicine

## 2014-03-23 LAB — CULTURE, BORDETELLA PERTUSSIS

## 2014-03-27 ENCOUNTER — Telehealth: Payer: Self-pay | Admitting: Family Medicine

## 2014-03-27 DIAGNOSIS — R059 Cough, unspecified: Secondary | ICD-10-CM

## 2014-03-27 DIAGNOSIS — R05 Cough: Secondary | ICD-10-CM

## 2014-03-27 MED ORDER — HYDROCODONE-HOMATROPINE 5-1.5 MG/5ML PO SYRP: 5.0000 mL | ORAL_SOLUTION | Freq: Three times a day (TID) | ORAL | Status: DC | PRN

## 2014-03-27 NOTE — Telephone Encounter (Signed)
ok 

## 2014-03-27 NOTE — Telephone Encounter (Signed)
RX printed and patient aware ready for pick up

## 2014-03-27 NOTE — Telephone Encounter (Signed)
Pt aware of lab results.  Still with bad cough though.  Wants refill of Hycodan??

## 2014-03-27 NOTE — Telephone Encounter (Signed)
Message copied by Donne Anon on Mon Mar 27, 2014  2:20 PM ------      Message from: Lynnea Ferrier      Created: Fri Mar 24, 2014  6:56 AM       No evidence of whooping cough. ------

## 2014-05-17 ENCOUNTER — Other Ambulatory Visit: Payer: Self-pay | Admitting: Physician Assistant

## 2014-05-18 NOTE — Telephone Encounter (Signed)
Medication refilled per protocol. 

## 2014-05-23 ENCOUNTER — Ambulatory Visit: Payer: 59 | Admitting: Physician Assistant

## 2014-05-24 ENCOUNTER — Ambulatory Visit: Payer: 59 | Admitting: Physician Assistant

## 2014-08-02 ENCOUNTER — Other Ambulatory Visit: Payer: 59

## 2014-08-02 ENCOUNTER — Other Ambulatory Visit: Payer: Self-pay | Admitting: Family Medicine

## 2014-08-02 MED ORDER — SYNTHROID 300 MCG PO TABS: 300.0000 ug | ORAL_TABLET | Freq: Every day | ORAL | Status: DC

## 2014-08-02 MED ORDER — SYNTHROID 25 MCG PO TABS: ORAL_TABLET | ORAL | Status: DC

## 2015-02-01 ENCOUNTER — Other Ambulatory Visit: Payer: 59

## 2015-02-01 ENCOUNTER — Other Ambulatory Visit: Payer: Self-pay | Admitting: Family Medicine

## 2015-02-01 DIAGNOSIS — E039 Hypothyroidism, unspecified: Secondary | ICD-10-CM

## 2015-02-02 ENCOUNTER — Other Ambulatory Visit: Payer: Self-pay | Admitting: Family Medicine

## 2015-02-02 LAB — TSH: TSH: 0.699 u[IU]/mL (ref 0.350–4.500)

## 2015-02-02 MED ORDER — SYNTHROID 25 MCG PO TABS: ORAL_TABLET | ORAL | Status: DC

## 2015-02-02 MED ORDER — SYNTHROID 300 MCG PO TABS: 300.0000 ug | ORAL_TABLET | Freq: Every day | ORAL | Status: DC

## 2015-03-28 ENCOUNTER — Ambulatory Visit: Payer: 59 | Admitting: Physician Assistant

## 2015-08-02 MED FILL — SYNTHROID 300 MCG TABLET: 300 | 90 days supply | Qty: 90 | Fill #1

## 2015-08-02 MED FILL — SYNTHROID 25 MCG TABLET: 25 | 90 days supply | Qty: 90 | Fill #1

## 2016-02-29 ENCOUNTER — Telehealth: Payer: 59 | Admitting: Family

## 2016-02-29 DIAGNOSIS — J019 Acute sinusitis, unspecified: Secondary | ICD-10-CM | POA: Diagnosis not present

## 2016-02-29 MED ORDER — AMOXICILLIN-POT CLAVULANATE 875-125 MG PO TABS: 1.0000 | ORAL_TABLET | Freq: Two times a day (BID) | ORAL | 0 refills | Status: DC

## 2016-02-29 NOTE — Progress Notes (Signed)

## 2016-05-07 ENCOUNTER — Encounter: Payer: Self-pay | Admitting: Physician Assistant

## 2016-05-07 ENCOUNTER — Ambulatory Visit (INDEPENDENT_AMBULATORY_CARE_PROVIDER_SITE_OTHER): Payer: 59 | Admitting: Physician Assistant

## 2016-05-07 VITALS — BP 120/80 | HR 76 | Temp 97.9°F | Resp 18

## 2016-05-07 DIAGNOSIS — E039 Hypothyroidism, unspecified: Secondary | ICD-10-CM | POA: Diagnosis not present

## 2016-05-07 DIAGNOSIS — R739 Hyperglycemia, unspecified: Secondary | ICD-10-CM

## 2016-05-07 LAB — HEMOGLOBIN A1C
HEMOGLOBIN A1C: 6.4 % — AB (ref <5.7)
Mean Plasma Glucose: 137 mg/dL

## 2016-05-07 LAB — TSH

## 2016-05-07 MED ORDER — SYNTHROID 300 MCG PO TABS: 300.0000 ug | ORAL_TABLET | Freq: Every day | ORAL | 0 refills | Status: DC

## 2016-05-07 MED ORDER — SYNTHROID 25 MCG PO TABS
ORAL_TABLET | ORAL | 0 refills | Status: DC
Start: 2016-05-07 — End: 2016-08-05

## 2016-05-07 MED ORDER — SYNTHROID 25 MCG PO TABS: ORAL_TABLET | ORAL | 0 refills | Status: DC

## 2016-05-07 MED FILL — SYNTHROID 25 MCG TABLET: 25 | 90 days supply | Qty: 90 | Fill #0

## 2016-05-07 MED FILL — SYNTHROID 300 MCG TABLET: 300 | 90 days supply | Qty: 90 | Fill #0

## 2016-05-07 NOTE — Progress Notes (Signed)
Patient ID: Grace RippleKaren M Finnigan MRN: [id number], DOB: 1975/11/21, 40 y.o. Date of Encounter: @DATE @  Chief Complaint:  Chief Complaint  Patient presents with  . Hypothyroidism    HPI: 40 y.o. year old female  presents with above.  She says that she is going to be honest and that she has not been taking her thyroid medication every day.  Says that she has been out of the 25 mcg tablet for a while.  As well, the 300 g dose--- has not taken this every single day either. Over the past month says that she probably has skipped this about 3 times a week. Her work schedule changes and so some days she is working during the day, somedays she is working at night.  Says that between her own work schedule changes-- and then keeping up with her kids activities and homework etc.--- that she just hasn't taken it every single day like she should.  Also says that if we're doing blood work to check her thyroid, she would like to also check an A1c. Her husband has diabetes and she sometimes uses his blood sugar meter to check her blood sugar.  Fasting readings --has been getting around 120 when she has checked it.  She has no other complaints or concerns today.  She still works in the ER as a Engineer, civil (consulting)nurse. She has 3 children ages 738,10 and 1314.   Past Medical History:  Diagnosis Date  . Allergy   . Hypothyroid      Home Meds: Outpatient Medications Prior to Visit  Medication Sig Dispense Refill  . SYNTHROID 25 MCG tablet TAKE 1 TABLET BY MOUTH DAILY BEFORE BREAKFAST. (TAKE WITH 300MCG TO = 325MCG DAILY) 90 tablet 1  . SYNTHROID 300 MCG tablet Take 1 tablet (300 mcg total) by mouth daily before breakfast. (Take with 25 mcg tab to = equal 325 mcg dose) 90 tablet 1  . amoxicillin-clavulanate (AUGMENTIN) 875-125 MG tablet Take 1 tablet by mouth 2 (two) times daily. (Patient not taking: Reported on 05/07/2016) 14 tablet 0  . azithromycin (ZITHROMAX) 250 MG tablet 2 tabs poqday1, 1 tab poqday 2-5 (Patient not  taking: Reported on 05/07/2016) 6 tablet 0  . fluconazole (DIFLUCAN) 150 MG tablet Take 1 tablet (150 mg total) by mouth once. (Patient not taking: Reported on 05/07/2016) 1 tablet 0  . HYDROcodone-homatropine (HYCODAN) 5-1.5 MG/5ML syrup Take 5 mLs by mouth every 8 (eight) hours as needed for cough. (Patient not taking: Reported on 05/07/2016) 120 mL 0   No facility-administered medications prior to visit.     Allergies: No Known Allergies  Social History   Social History  . Marital status: Married    Spouse name: N/A  . Number of children: N/A  . Years of education: N/A   Occupational History  . Not on file.   Social History Main Topics  . Smoking status: Never Smoker  . Smokeless tobacco: Never Used  . Alcohol use No  . Drug use: No  . Sexual activity: Not on file   Other Topics Concern  . Not on file   Social History Narrative  . No narrative on file    No family history on file.   Review of Systems:  See HPI for pertinent ROS. All other ROS negative.    Physical Exam: Blood pressure 120/80, pulse 76, temperature 97.9 F (36.6 C), temperature source Oral, resp. rate 18, last menstrual period 04/16/2016, SpO2 98 %., There is no height or weight on  file to calculate BMI. General: Obese WF. Appears in no acute distress. Neck: Supple. No thyromegaly. No lymphadenopathy. Lungs: Clear bilaterally to auscultation without wheezes, rales, or rhonchi. Breathing is unlabored. Heart: RRR with S1 S2. No murmurs, rubs, or gallops. Musculoskeletal:  Strength and tone normal for age. Extremities/Skin: Warm and dry.  Neuro: Alert and oriented X 3. Moves all extremities spontaneously. Gait is normal. CNII-XII grossly in tact. Psych:  Responds to questions appropriately with a normal affect.     ASSESSMENT AND PLAN:  40 y.o. year old female with  1. Hypothyroidism, unspecified type Discussed importance of making sure she takes her thyroid medication daily at length. Recommend  she get a pillbox so that she can keep her medicines with her in her purse--- that way she will have them with her whether she is at home or at work and she can keep up with whether she has already taken that days' pill or not. Will recheck TSH today but most likely that is going to come back abnormal in which case she will need to resume her prior dose and then recheck TSH 6 weeks. Will go ahead and check labs while she is here as she does not come in for office visits routinely. - TSH  2. Hyperglycemia She reports that she has been checking her blood sugar occasionally and fasting readings are around 120. We'll check A1c to further evaluate. - Hemoglobin A1c   Signed, [address] Beth WhitetailDixon, [place], [facility] 05/07/2016 12:15 PM

## 2016-05-16 ENCOUNTER — Encounter: Payer: Self-pay | Admitting: Family Medicine

## 2016-07-22 ENCOUNTER — Telehealth: Payer: Self-pay

## 2016-07-22 ENCOUNTER — Other Ambulatory Visit: Payer: 59

## 2016-07-22 DIAGNOSIS — E039 Hypothyroidism, unspecified: Secondary | ICD-10-CM

## 2016-07-22 LAB — TSH: TSH: 0.03 m[IU]/L — AB

## 2016-07-22 NOTE — Telephone Encounter (Signed)
TSH order placed.

## 2016-07-29 ENCOUNTER — Other Ambulatory Visit: Payer: Self-pay

## 2016-07-29 DIAGNOSIS — E039 Hypothyroidism, unspecified: Secondary | ICD-10-CM

## 2016-07-31 ENCOUNTER — Encounter: Payer: Self-pay | Admitting: Physician Assistant

## 2016-08-05 ENCOUNTER — Other Ambulatory Visit: Payer: Self-pay | Admitting: Physician Assistant

## 2016-08-05 MED ORDER — SYNTHROID 300 MCG PO TABS: 300.0000 ug | ORAL_TABLET | Freq: Every day | ORAL | 0 refills | Status: DC

## 2016-08-05 MED ORDER — SYNTHROID 25 MCG PO TABS: ORAL_TABLET | ORAL | 0 refills | Status: DC

## 2016-08-05 MED FILL — SYNTHROID 300 MCG TABLET: 300 | 90 days supply | Qty: 90 | Fill #0

## 2016-08-05 MED FILL — SYNTHROID 25 MCG TABLET: 25 | 90 days supply | Qty: 90 | Fill #0

## 2016-08-05 NOTE — Telephone Encounter (Signed)
Medication refilled per protocol. 

## 2016-08-06 NOTE — Telephone Encounter (Signed)
Refill appropriate 

## 2016-08-13 ENCOUNTER — Ambulatory Visit (HOSPITAL_COMMUNITY)
Admission: RE | Admit: 2016-08-13 | Discharge: 2016-08-13 | Disposition: A | Discharge status: Home / Self Care | Payer: 59 | Source: Ambulatory Visit | Attending: Physician Assistant | Admitting: Physician Assistant

## 2016-08-13 ENCOUNTER — Ambulatory Visit (INDEPENDENT_AMBULATORY_CARE_PROVIDER_SITE_OTHER): Payer: 59 | Admitting: Physician Assistant

## 2016-08-13 ENCOUNTER — Encounter: Payer: Self-pay | Admitting: Physician Assistant

## 2016-08-13 VITALS — BP 130/88 | HR 97 | Temp 98.2°F | Resp 18

## 2016-08-13 DIAGNOSIS — R2231 Localized swelling, mass and lump, right upper limb: Secondary | ICD-10-CM

## 2016-08-13 DIAGNOSIS — M79601 Pain in right arm: Secondary | ICD-10-CM | POA: Diagnosis not present

## 2016-08-13 NOTE — Progress Notes (Signed)
Patient ID: Grace RippleKaren M Hawkins MRN: [id number], DOB: 11/09/1975, 41 y.o. Date of Encounter: 08/13/2016, 12:06 PM    Chief Complaint:  Chief Complaint  Patient presents with  . knot on right arm    [id number]     HPI: 41 y.o. year old female presents with above.   She states that this past fall she had some problems with diffuse muscle aches. Says "that all seemed to happen around the time that she had gotten her flu shot and then also when her thyroid levels were found to be out of whack". I reviewed chart -- it was 05/07/16 that her TSH was > 150. Says that once she got back on her thyroid medicine and that got straight, the muscle aches resolved. Also notes that "they always use a certain vein every time they draw blood" and points to that vein and says she "doesn't know if that has anything to do with this."  Nonetheless, has recently noticed a palpable mass at elbow level near the brachial site--right arm. It is an approximate 2 cm x 1 cm firm mass. Just distal to this-- is a small firm mass that is approximately 1/2 cm diameter.  She reports that she has had venous ultrasound that showed no blood clot.   Home Meds:   Outpatient Medications Prior to Visit  Medication Sig Dispense Refill  . SYNTHROID 25 MCG tablet TAKE 1 TABLET BY MOUTH DAILY BEFORE BREAKFAST. (TAKE WITH 300MCG TO = 325MCG DAILY) 90 tablet 0  . SYNTHROID 300 MCG tablet Take 1 tablet (300 mcg total) by mouth daily before breakfast. (Take with 25 mcg tab to = equal 325 mcg dose) 90 tablet 0  . amoxicillin-clavulanate (AUGMENTIN) 875-125 MG tablet Take 1 tablet by mouth 2 (two) times daily. (Patient not taking: Reported on 05/07/2016) 14 tablet 0  . azithromycin (ZITHROMAX) 250 MG tablet 2 tabs poqday1, 1 tab poqday 2-5 (Patient not taking: Reported on 05/07/2016) 6 tablet 0  . fluconazole (DIFLUCAN) 150 MG tablet Take 1 tablet (150 mg total) by mouth once. (Patient not taking: Reported on 05/07/2016) 1 tablet 0  .  HYDROcodone-homatropine (HYCODAN) 5-1.5 MG/5ML syrup Take 5 mLs by mouth every 8 (eight) hours as needed for cough. (Patient not taking: Reported on 05/07/2016) 120 mL 0  . SYNTHROID 25 MCG tablet TAKE 1 TABLET BY MOUTH DAILY BEFORE BREAKFAST. (TAKE WITH 300MCG TO = 325MCG DAILY) (Patient not taking: Reported on 08/13/2016) 90 tablet 0  . SYNTHROID 300 MCG tablet TAKE 1 TABLET (300 MCG TOTAL) BY MOUTH DAILY BEFORE BREAKFAST. (TAKE WITH 25 MCG TAB TO = 325 MCG DOSE) (Patient not taking: Reported on 08/13/2016) 90 tablet 0   No facility-administered medications prior to visit.     Allergies: No Known Allergies    Review of Systems: See HPI for pertinent ROS. All other ROS negative.    Physical Exam: Blood pressure 130/88, pulse 97, temperature 98.2 F (36.8 C), temperature source Oral, resp. rate 18, last menstrual period 07/30/2016, SpO2 97 %., There is no height or weight on file to calculate BMI. General: Obese WF.  Appears in no acute distress. Neck: Supple. No thyromegaly. No lymphadenopathy. Lungs: Clear bilaterally to auscultation without wheezes, rales, or rhonchi. Breathing is unlabored. Heart: Regular rhythm. No murmurs, rubs, or gallops. Msk:  Strength and tone normal for age. Extremities: Right Arm: palpable mass at elbow level near the brachial site--right arm. It is an approximate 2 cm x 1 cm firm mass. Just distal to  this-- is a small firm mass that is approximately 1/2 cm diameter. There is no erythema, no warmth, no ecchymosis, no rash or skin lesion.  Neuro: Alert and oriented X 3. Moves all extremities spontaneously. Gait is normal. CNII-XII grossly in tact. Psych:  Responds to questions appropriately with a normal affect.     ASSESSMENT AND PLAN:  41 y.o. year old female with  1. Arm mass, right Will obtain ultrasound to evaluate to see if this can determine whether this is lipoma, cyst, or solid mass. Scheduled for [facility] tomorrow morning at 8 AM. Patient voices  understanding and agrees and is available to go at that time and location. I will follow up with her once I get that result. She voices understanding and is in agreement. - [place] RT UPPER EXTREM LTD SOFT TISSUE NON VASCULAR; Future   Signed, [address] [place], [place], [facility] 08/13/2016 12:06 PM

## 2016-08-14 ENCOUNTER — Ambulatory Visit (HOSPITAL_COMMUNITY): Payer: 59

## 2016-08-14 ENCOUNTER — Encounter: Payer: Self-pay | Admitting: Physician Assistant

## 2016-08-14 DIAGNOSIS — R2231 Localized swelling, mass and lump, right upper limb: Secondary | ICD-10-CM

## 2016-08-18 NOTE — Telephone Encounter (Signed)
-----   Message from Dorena BodoMary B Dixon, PA-C sent at 08/14/2016  1:37 PM EST ----- I called patient and discussed results of ultrasound. Discussed that findings are most consistent with hematoma. In that case hematoma would be secondary to lab draws. (No other injury to that area to have caused hematoma) However her last lab draw was 07/22/16. It is now 3 weeks later. She states that the pain is actually getting worse not better. States that she is also having some weakness in that hand. Discussed options at this point--I can order MRI versus her seeing a specialist. H She states that she [facility] and wants to go ahead and see him and they can do the MRI there at their office.  PLACE Order for REFERRAL to Orthopedics-- [facility].

## 2016-09-06 DIAGNOSIS — R2231 Localized swelling, mass and lump, right upper limb: Secondary | ICD-10-CM | POA: Diagnosis not present

## 2016-10-03 ENCOUNTER — Other Ambulatory Visit (HOSPITAL_COMMUNITY): Payer: Self-pay | Admitting: Orthopedic Surgery

## 2016-10-06 ENCOUNTER — Other Ambulatory Visit (HOSPITAL_COMMUNITY): Payer: Self-pay | Admitting: Orthopedic Surgery

## 2016-10-06 DIAGNOSIS — M25521 Pain in right elbow: Secondary | ICD-10-CM

## 2016-11-18 IMAGING — US US EXTREM UP *R* LTD
1 series · 14 of 25 positions shown · non-contrast
Comparison: None

CLINICAL DATA: Right upper arm.

EXAM:
ULTRASOUND RIGHT UPPER EXTREMITY LIMITED
TECHNIQUE: Ultrasound examination of the upper extremity soft tissues was
performed in the area of clinical concern.

[Series 1: us extrem up *right* ltd · 0.05mm/px · 31 acquisitions, 14 frames shown]
[im 1/31]
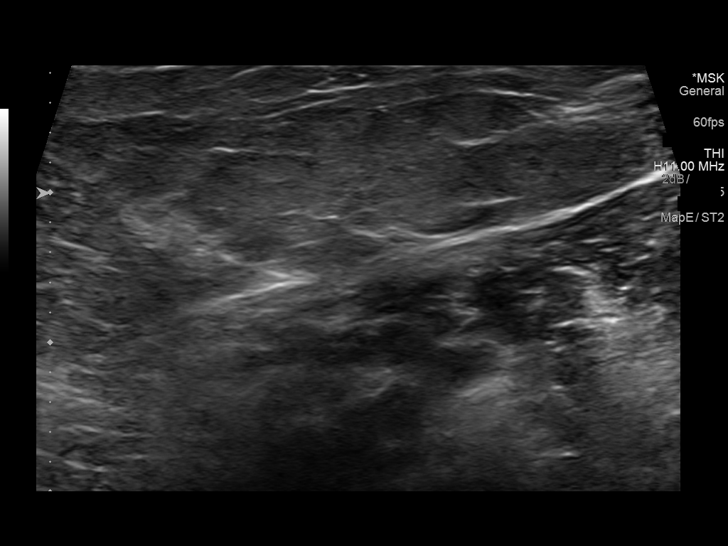
[im 3/31]
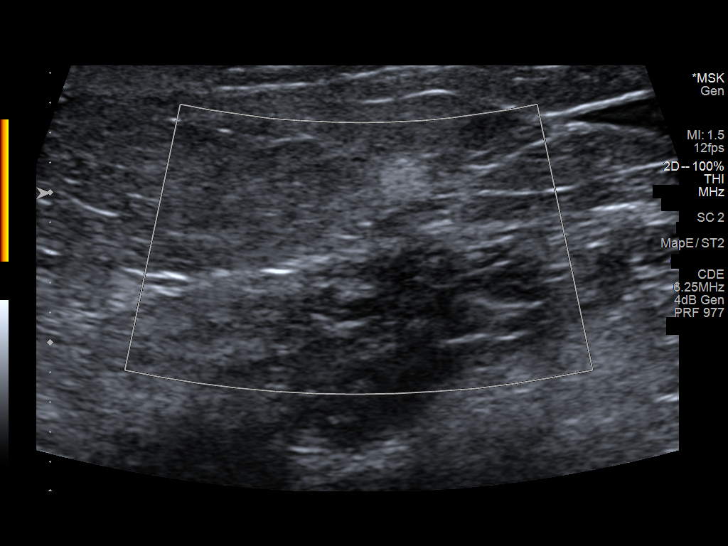
[im 6/31]
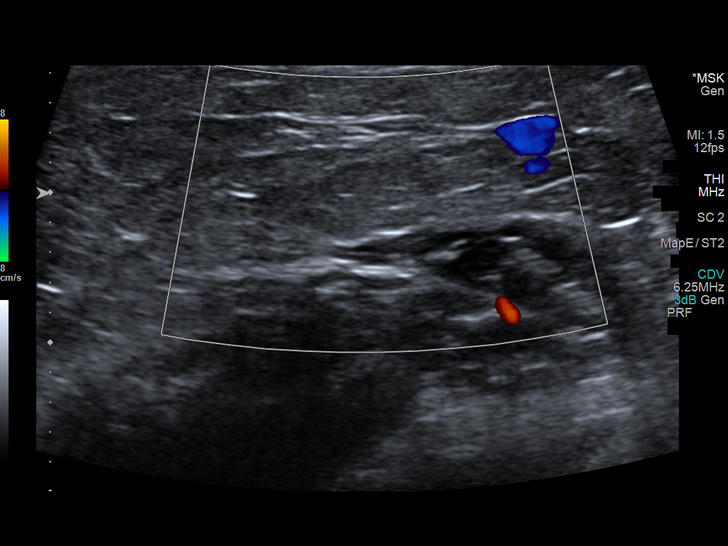
[im 8/31]
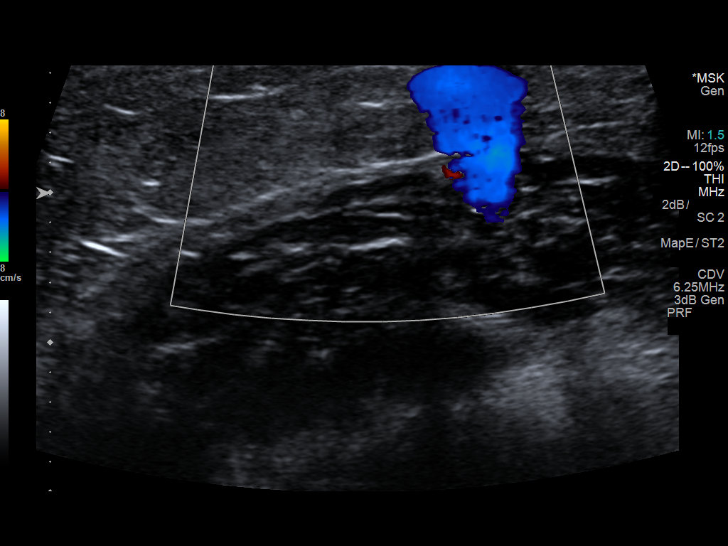
[im 11/31]
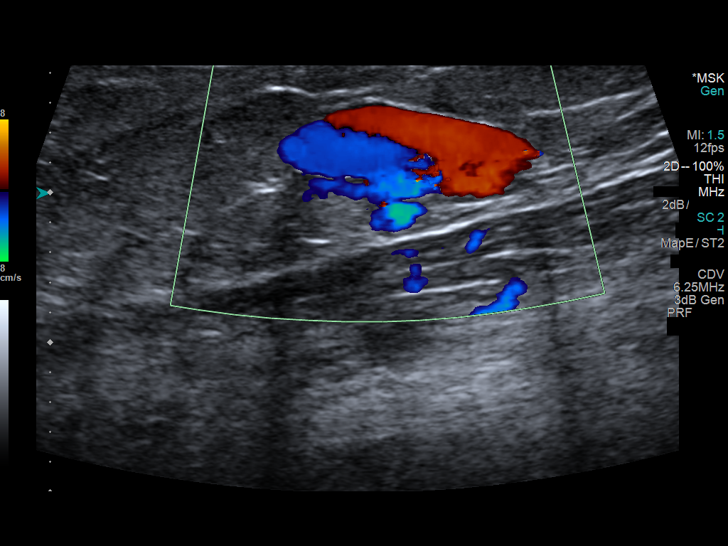
[im 12/31]
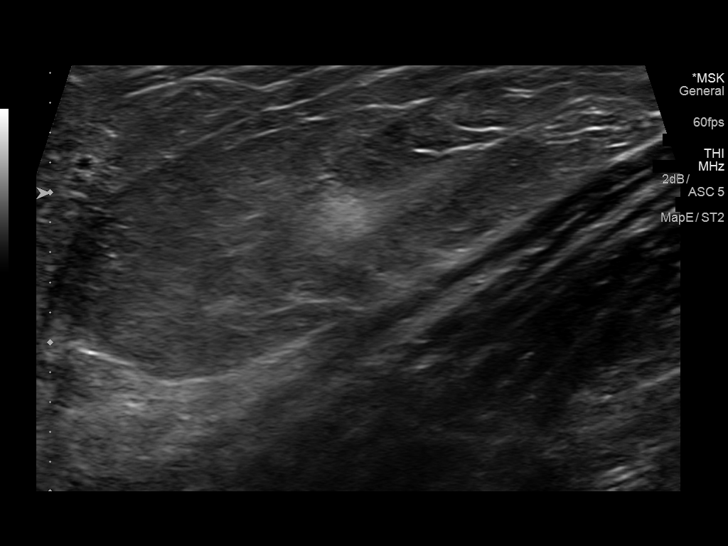
[im 14/31]
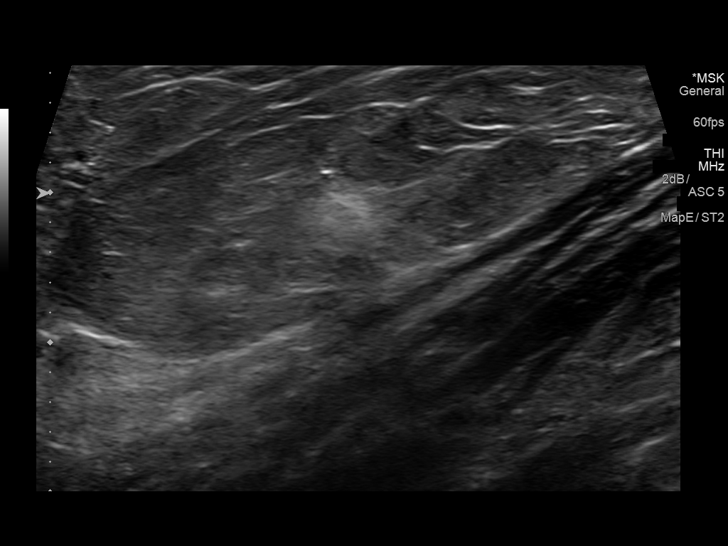
[im 17/31]
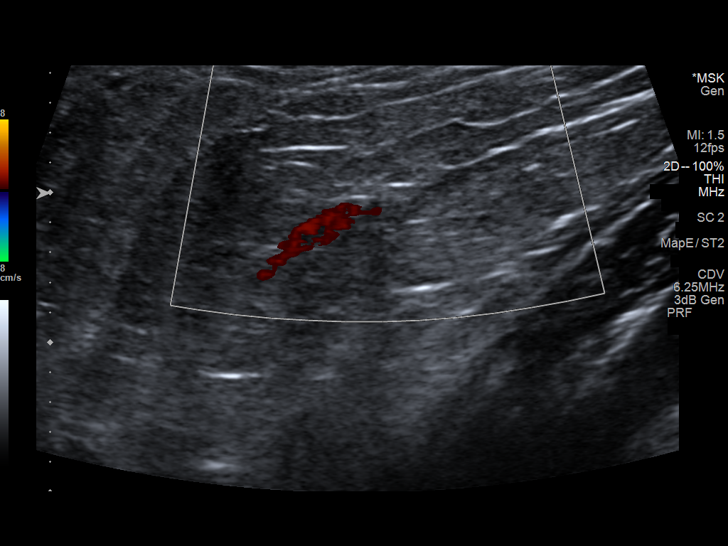
[im 19/31]
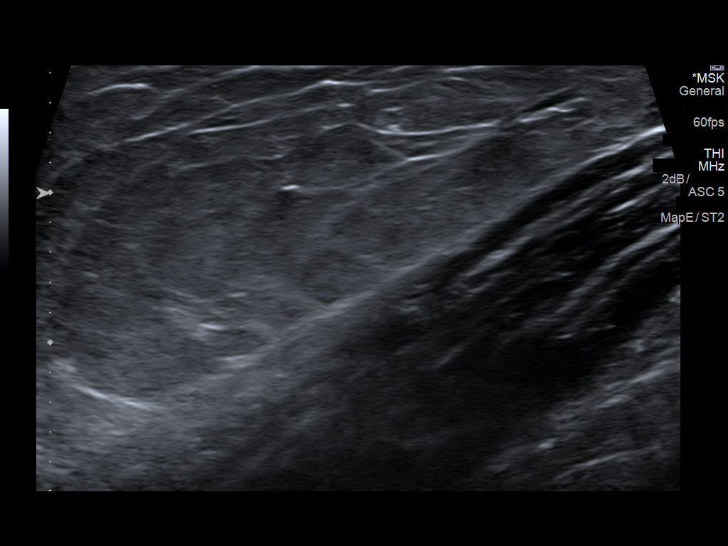
[im 21/31]
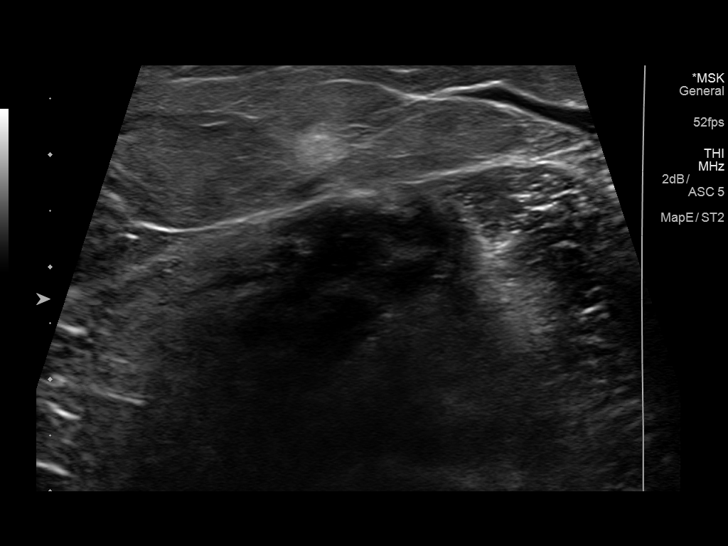
[im 23/31]
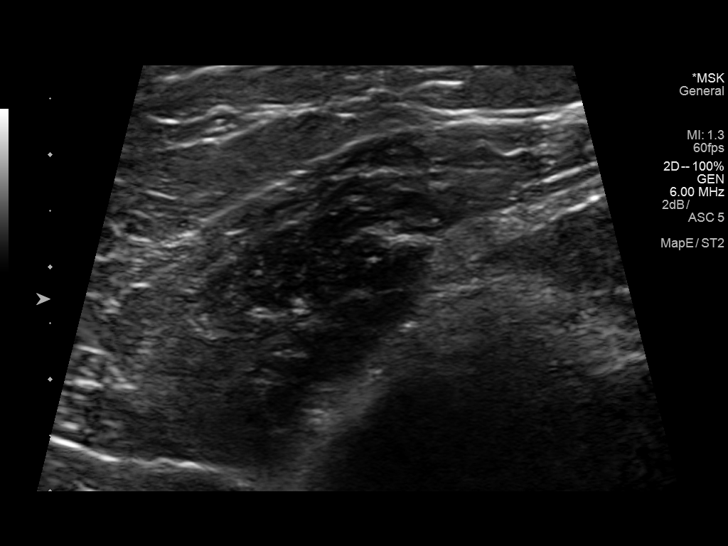
[im 26/31]
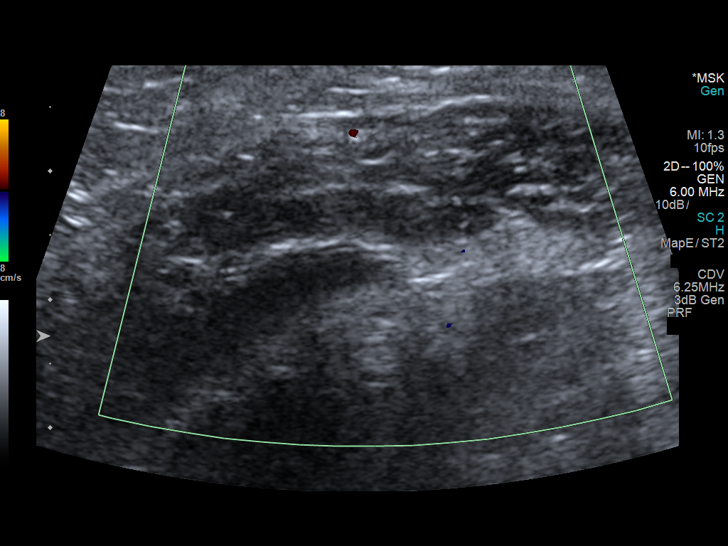
[im 28/31]
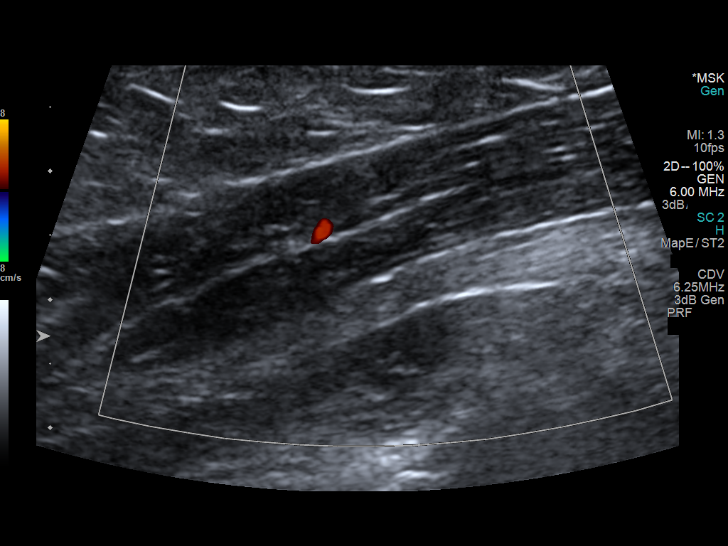
[im 31/31]
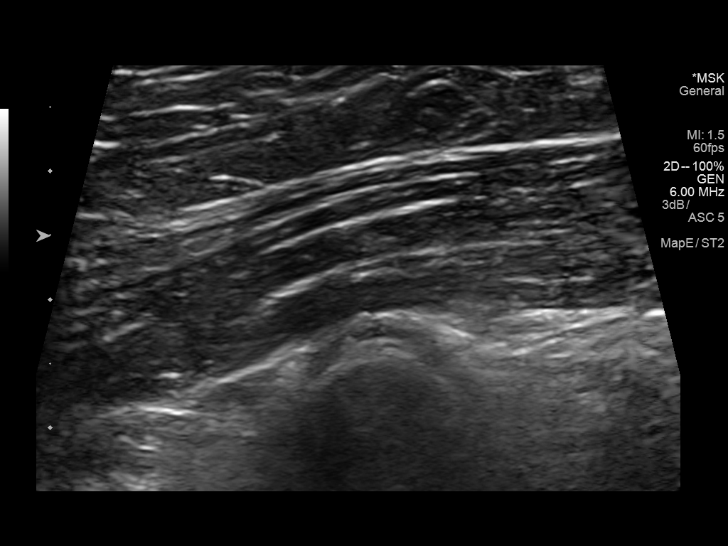

[14 of 25 positions shown; findings below may reference images not displayed]

FINDINGS: Real-time sonography of the right antecubital fossa is performed.
Along the radial aspect there is a 8 x 6 x 4 mm hyperechoic mass
without internal Doppler flow. No other solid or cystic masses. No
architectural distortion.
IMPRESSION: 8 x 6 x 4 mm hyperechoic avascular mass in the radial aspect of the
antecubital fossa. Differential considerations include a hematoma
versus less likely a solid mass. Recommend clinical follow-up to
document complete resolution. In the absence of complete resolution,
recommend follow-up MRI of the right elbow with and without
intravenous contrast in 4-6 weeks.

## 2017-01-20 ENCOUNTER — Encounter: Payer: Self-pay | Admitting: Physician Assistant

## 2017-01-28 ENCOUNTER — Other Ambulatory Visit: Payer: 59

## 2017-01-28 DIAGNOSIS — E039 Hypothyroidism, unspecified: Secondary | ICD-10-CM

## 2017-01-29 ENCOUNTER — Encounter: Payer: Self-pay | Admitting: Physician Assistant

## 2017-01-29 ENCOUNTER — Other Ambulatory Visit: Payer: Self-pay

## 2017-01-29 DIAGNOSIS — E039 Hypothyroidism, unspecified: Secondary | ICD-10-CM

## 2017-01-29 LAB — TSH: TSH: 14.47 mIU/L — ABNORMAL HIGH

## 2017-01-29 MED ORDER — SYNTHROID 300 MCG PO TABS: 300.0000 ug | ORAL_TABLET | Freq: Every day | ORAL | 0 refills | Status: DC

## 2017-01-29 MED ORDER — SYNTHROID 25 MCG PO TABS: ORAL_TABLET | ORAL | 0 refills | Status: DC

## 2017-01-29 MED FILL — SYNTHROID 300 MCG TABLET: 300 | 90 days supply | Qty: 90 | Fill #0

## 2017-01-29 MED FILL — SYNTHROID 25 MCG TABLET: 25 | 90 days supply | Qty: 90 | Fill #0

## 2017-04-08 ENCOUNTER — Telehealth: Payer: 59 | Admitting: Family

## 2017-04-08 ENCOUNTER — Encounter: Payer: Self-pay | Admitting: Physician Assistant

## 2017-04-08 DIAGNOSIS — J029 Acute pharyngitis, unspecified: Secondary | ICD-10-CM | POA: Diagnosis not present

## 2017-04-08 MED ORDER — AMOXICILLIN-POT CLAVULANATE 875-125 MG PO TABS: 1.0000 | ORAL_TABLET | Freq: Two times a day (BID) | ORAL | 0 refills | Status: DC

## 2017-04-08 MED FILL — AMOX TR-K CLV 875-125 MG TA: 875-125 | 7 days supply | Qty: 14 | Fill #0

## 2017-04-08 NOTE — Progress Notes (Signed)
We are sorry that you are not feeling well.  Here is how we plan to help!  Based on what you have shared with me it looks like you have acute pharyngitis.  This is an infection caused by bacteria and is treated with antibiotics. I have prescribed Augmentin /125mg  one tablet twice daily with food, for 7 days. You may use an oral decongestant such as Mucinex D or if you have glaucoma or high blood pressure use plain Mucinex. Saline nasal spray help and can safely be used as often as needed for congestion.  If you develop worsening sinus pain, fever or notice severe headache and vision changes, or if symptoms are not better after completion of antibiotic, please schedule an appointment with a health care provider.    Make sure you get a new toothbrush in 3 days!  Home Care:  Only take medications as instructed by your medical team.  Complete the entire course of an antibiotic.  Do not take these medications with alcohol.  A steam or ultrasonic humidifier can help congestion.  You can place a towel over your head and breathe in the steam from hot water coming from a faucet.  Avoid close contacts especially the very young and the elderly.  Cover your mouth when you cough or sneeze.  Always remember to wash your hands.  Get Help Right Away If:  You develop worsening fever or sinus pain.  You develop a severe head ache or visual changes.  Your symptoms persist after you have completed your treatment plan.  Make sure you  Understand these instructions.  Will watch your condition.  Will get help right away if you are not doing well or get worse.  Your e-visit answers were reviewed by a board certified advanced clinical practitioner to complete your personal care plan.  Depending on the condition, your plan could have included both over the counter or prescription medications.  If there is a problem please reply  once you have received a response from your provider.  Your safety  is important to [place].  If you have drug allergies check your prescription carefully.    You can use MyChart to ask questions about today's visit, request a non-urgent call back, or ask for a work or school excuse for 24 hours related to this e-Visit. If it has been greater than 24 hours you will need to follow up with your provider, or enter a new e-Visit to address those concerns.  You will get an e-mail in the next two days asking about your experience.  I hope that your e-visit has been valuable and will speed your recovery. Thank you for using e-visits.

## 2017-04-09 ENCOUNTER — Other Ambulatory Visit: Payer: 59

## 2017-04-09 DIAGNOSIS — E039 Hypothyroidism, unspecified: Secondary | ICD-10-CM | POA: Diagnosis not present

## 2017-04-09 LAB — TSH: TSH: 0.33 mIU/L — ABNORMAL LOW

## 2017-04-13 ENCOUNTER — Encounter: Payer: Self-pay | Admitting: *Deleted

## 2017-05-19 MED FILL — SYNTHROID 300 MCG TABLET: 300 | 90 days supply | Qty: 90 | Fill #0

## 2017-05-19 MED FILL — SYNTHROID 25 MCG TABLET: 25 | 90 days supply | Qty: 90 | Fill #0

## 2017-09-02 ENCOUNTER — Other Ambulatory Visit: Payer: Self-pay | Admitting: Physician Assistant

## 2017-09-02 MED FILL — SYNTHROID 300 MCG TABLET: 300 | 90 days supply | Qty: 90 | Fill #0

## 2017-09-02 MED FILL — SYNTHROID 25 MCG TABLET: 25 | 90 days supply | Qty: 90 | Fill #0

## 2017-12-17 ENCOUNTER — Other Ambulatory Visit: Payer: Self-pay

## 2017-12-17 ENCOUNTER — Ambulatory Visit (INDEPENDENT_AMBULATORY_CARE_PROVIDER_SITE_OTHER): Payer: No Typology Code available for payment source | Admitting: Physician Assistant

## 2017-12-17 ENCOUNTER — Encounter: Payer: Self-pay | Admitting: Physician Assistant

## 2017-12-17 VITALS — BP 130/98 | HR 103 | Temp 98.2°F | Resp 18 | Ht 68.0 in

## 2017-12-17 DIAGNOSIS — E119 Type 2 diabetes mellitus without complications: Secondary | ICD-10-CM | POA: Diagnosis not present

## 2017-12-17 DIAGNOSIS — E039 Hypothyroidism, unspecified: Secondary | ICD-10-CM

## 2017-12-17 DIAGNOSIS — Z794 Long term (current) use of insulin: Secondary | ICD-10-CM

## 2017-12-17 DIAGNOSIS — Z0000 Encounter for general adult medical examination without abnormal findings: Secondary | ICD-10-CM | POA: Diagnosis not present

## 2017-12-17 DIAGNOSIS — R739 Hyperglycemia, unspecified: Secondary | ICD-10-CM

## 2017-12-17 LAB — HEMOGLOBIN A1C, FINGERSTICK: HEMOGLOBIN A1C, FINGERSTICK: 11.2 %{Hb} — AB (ref <6.0)

## 2017-12-17 LAB — GLUCOSE 16585: GLUCOSE 2500: 233 mg/dL — AB (ref 65–99)

## 2017-12-17 MED ORDER — BLOOD GLUCOSE MONITORING SUPPL KIT: PACK | 0 refills | Status: DC

## 2017-12-17 MED ORDER — LANCETS ULTRA THIN 30G MISC: 5 refills | Status: DC

## 2017-12-17 MED ORDER — METFORMIN HCL 500 MG PO TABS: 500.0000 mg | ORAL_TABLET | Freq: Two times a day (BID) | ORAL | 11 refills | Status: DC

## 2017-12-17 MED ORDER — BASAGLAR KWIKPEN 100 UNIT/ML ~~LOC~~ SOPN: PEN_INJECTOR | SUBCUTANEOUS | 5 refills | Status: DC

## 2017-12-17 MED ORDER — GLUCOSE BLOOD VI STRP: ORAL_STRIP | 12 refills | Status: DC

## 2017-12-17 MED FILL — FREESTYLE LITE METER: 20 days supply | Qty: 1 | Fill #0

## 2017-12-17 MED FILL — FREESTYLE LANCETS: 30 days supply | Qty: 100 | Fill #0

## 2017-12-17 MED FILL — LANTUS SOLOSTAR 100 UNITS/M: 100 | 30 days supply | Qty: 9 | Fill #0

## 2017-12-17 MED FILL — FREESTYLE LITE TEST STRIP: 30 days supply | Qty: 100 | Fill #0

## 2017-12-17 MED FILL — metFORMIN HCL 500 MG TABS: 500 | 30 days supply | Qty: 60 | Fill #0

## 2017-12-17 NOTE — Progress Notes (Signed)
Patient ID: Grace RippleKaren M Hawkins MRN: [id number], DOB: March 05, 1976, 42 y.o. Date of Encounter: 12/17/2017,   Chief Complaint: Physical (CPE)  HPI: 42 y.o. y/o female  here for CPE.    She presents for complete physical exam. She reports that she has a gynecologist that she sees routinely for GYN exam.  She has hypothyroidism.  She has been taking her thyroid make medication daily.  Due to recheck TSH as well.  She also reports that she has recently been checking her blood sugar and getting high readings.  Wanted to follow that up today as well. She states that she checked it right before coming here and reading was 247.  Has been fasting for > 12 hours. Reports that she has been noticing some urinary urgency and polyuria. She states that she has already tried to decrease sugars and carbs in her diet and that if sugar and A1c are elevated that she is agreeable to start medication.  She has no other specific concerns to address.  She has 3 children.  The youngest 2 are 699 and 40 years old. 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  States that everything else has been stable other than issue with her sugar above.  Has no other concerns to address.   Review of Systems: Consitutional: No fever, chills, fatigue, night sweats, lymphadenopathy. No significant/unexplained weight changes. Eyes: No visual changes, eye redness, or discharge. ENT/Mouth: No ear pain, sore throat, nasal drainage, or sinus pain. Cardiovascular: No chest pressure,heaviness, tightness or squeezing, even with exertion. No increased shortness of breath or dyspnea on exertion.No palpitations, edema, orthopnea, PND. Respiratory: No cough, hemoptysis, SOB, or wheezing. Gastrointestinal: No anorexia, dysphagia, reflux, pain, nausea, vomiting, hematemesis, diarrhea, constipation, BRBPR, or melena. Breast: No mass, nodules, bulging,  or retraction. No skin changes or inflammation. No nipple discharge. No lymphadenopathy. Genitourinary: No dysuria, hematuria, incontinence, vaginal discharge, pruritis, burning, abnormal bleeding, or pain. Musculoskeletal: No decreased ROM, No joint pain or swelling. No significant pain in neck, back, or extremities. Skin: No rash, pruritis, or concerning lesions. Neurological: No headache, dizziness, syncope, seizures, tremors, memory loss, coordination problems, or paresthesias. Psychological: No anxiety, depression, hallucinations, SI/HI. Endocrine: No increased fatigue. No palpitations/rapid heart rate. No significant/unexplained weight change. POSITIVE : POLYURIA, HIGH BLOOD SUGAR All other systems were reviewed and are otherwise negative.  Past Medical History:  Diagnosis Date  . Allergy   . Hypothyroid      No past surgical history on file.  Home Meds:  Outpatient Medications Prior to Visit  Medication Sig Dispense Refill  . SYNTHROID 25 MCG tablet TAKE 1 TABLET BY MOUTH DAILY BEFORE BREAKFAST. (TAKE WITH 300MCG TO = 325MCG DAILY) 90 tablet 0  . SYNTHROID 300 MCG tablet Take 1 tablet (300 mcg total) by mouth daily before breakfast. (Take with 25 mcg tab to = equal 325 mcg dose) 90 tablet 0  . SYNTHROID 25 MCG tablet TAKE 1 TABLET BY MOUTH DAILY BEFORE BREAKFAST. (TAKE WITH 300MCG TO = 325MCG DAILY) 90 tablet 0  . amoxicillin-clavulanate (AUGMENTIN) 875-125 MG tablet Take 1 tablet by mouth 2 (two) times daily. 14 tablet 0  . azithromycin (ZITHROMAX) 250 MG tablet 2 tabs poqday1, 1 tab poqday 2-5 (Patient not taking: Reported on 05/07/2016) 6 tablet 0  . fluconazole (DIFLUCAN) 150 MG tablet Take 1 tablet (150 mg total) by mouth once. (Patient not taking: Reported on 05/07/2016)  1 tablet 0  . HYDROcodone-homatropine (HYCODAN) 5-1.5 MG/5ML syrup Take 5 mLs by mouth every 8 (eight) hours as needed for cough. (Patient not taking: Reported on 05/07/2016) 120 mL 0  . SYNTHROID 300 MCG tablet  TAKE 1 TABLET BY MOUTH DAILY BEFORE BREAKFAST. (TAKE WITH 25 MCG TAB TO = 325 MCG DOSE) 90 tablet 0   No facility-administered medications prior to visit.     Allergies: No Known Allergies  Social History   Socioeconomic History  . Marital status: Married    Spouse name: Not on file  . Number of children: Not on file  . Years of education: Not on file  . Highest education level: Not on file  Occupational History  . Not on file  Social Needs  . Financial resource strain: Not on file  . Food insecurity:    Worry: Not on file    Inability: Not on file  . Transportation needs:    Medical: Not on file    Non-medical: Not on file  Tobacco Use  . Smoking status: Never Smoker  . Smokeless tobacco: Never Used  Substance and Sexual Activity  . Alcohol use: No  . Drug use: No  . Sexual activity: Not on file  Lifestyle  . Physical activity:    Days per week: Not on file    Minutes per session: Not on file  . Stress: Not on file  Relationships  . Social connections:    Talks on phone: Not on file    Gets together: Not on file    Attends religious service: Not on file    Active member of club or organization: Not on file    Attends meetings of clubs or organizations: Not on file    Relationship status: Not on file  . Intimate partner violence:    Fear of current or ex partner: Not on file    Emotionally abused: Not on file    Physically abused: Not on file    Forced sexual activity: Not on file  Other Topics Concern  . Not on file  Social History Narrative  . Not on file    No family history on file.  Physical Exam: Blood pressure (!) 130/98, pulse (!) 103, temperature 98.2 F (36.8 C), temperature source Oral, resp. rate 18, height 5\' 8"  (1.727 m), last menstrual period 11/29/2017, SpO2 100 %., There is no height or weight on file to calculate BMI. General: WF. Appears in no acute distress. HEENT: Normocephalic, atraumatic. Conjunctiva pink, sclera non-icteric. Pupils  2 mm constricting to 1 mm, round, regular, and equally reactive to light and accomodation. EOMI. Internal auditory canal clear. TMs with good cone of light and without pathology. Nasal mucosa pink. Nares are without discharge. No sinus tenderness. Oral mucosa pink. Neck: Supple. Trachea midline. No thyromegaly. Full ROM. No lymphadenopathy.No Carotid Bruits. Lungs: Clear to auscultation bilaterally without wheezes, rales, or rhonchi. Breathing is of normal effort and unlabored. Cardiovascular: RRR with S1 S2. No murmurs, rubs, or gallops. Distal pulses 2+ symmetrically. No carotid or abdominal bruits. Breast: Per Gyn Abdomen: Soft, non-tender, non-distended with normoactive bowel sounds. No hepatosplenomegaly or masses. No rebound/guarding. No CVA tenderness. No hernias.  Genitourinary: Per Gyn Musculoskeletal: Full range of motion and 5/5 strength throughout.  Skin: Warm and moist without erythema, ecchymosis, wounds, or rash. Neuro: A+Ox3. CN II-XII grossly intact. Moves all extremities spontaneously. Full sensation throughout. Normal gait. Psych:  Responds to questions appropriately with a normal affect.   Assessment/Plan:  42  y.o. y/o female here for CPE  1. Encounter for preventive health examination  A. Screening Labs: She is fasting. - CBC with Differential/Platelet - COMPLETE METABOLIC PANEL WITH GFR - Lipid panel - TSH  B. Pap: --Per Gyn  C. Screening Mammogram: N/A--age 31  D. DEXA/BMD:  N/A--age 6  E. Colorectal Cancer Screening: N/A--age 35  F. Immunizations:  Influenza:-----------N/A Tetanus:--- She states that this is up-to-date.  It is documented at occupational health.  Not documented in epic. Pneumococcal:   Shinrix: Will discuss at age 22    Hyperglycemia Type 2 diabetes mellitus without complication, with long-term current use of insulin (HCC)   - Glucose, fingerstick (stat) - Hemoglobin A1C, fingerstick  Blood sugar and A1c were run here in the  office during visit.  Discussed those results with her today. Blood sugar reading 233. A1c 11.2.  Discussed with her that on average each oral medication brings A1c down about 1 point. Discussed that we could start 4 or 5 oral medications. She feels that since A1c is this high that we should just go ahead and start metformin and insulin.  Today I am starting metformin 500mg .  She is to start with just taking it once a day.  If this causes no GI side effects, then she can increase to 1 twice a day. Today with starting Basaglar.  I have given her a sample pen of this.  Also have given her blood sugar log form and filled this out with date and corresponding units of insulin. States that her work schedule changes.  Says that that is one problem for her regarding taking her Synthroid.  It is very difficult to find a consistent time of day to take it. However, overall, she thinks that around 8 AM will be the best time for her to consistently take insulin. She will start first dose of insulin tomorrow morning--- 6/21 she will give 10 units. 6/22------ 11 units 6/23------- 12 units 6/24--------13 units 6/25---------14 units 6/26----------15 units 6/27-----------16 units  Each of these dates she will check her fasting blood sugar and document on the log form and then she will administer that number of units of insulin on that corresponding date. She will bring this blood sugar log form with her to follow-up appointment with me 1 week from today June 27 of 4:15 PM. Staff has also sent in prescription for meter and supplies.  At follow-up visit will titrate insulin to control sugar. At follow-up visit also will re-view lipid panel from today's labs and discuss statin. At follow-up visit also will discuss adding aspirin 81 mg daily and also discussed adding ACE inhibitor. At follow-up visit also will discuss Pneumovax 23. If she has any questions or concerns in the interim she will  call/follow-up.  - Insulin Glargine (BASAGLAR KWIKPEN) 100 UNIT/ML SOPN; Administer as directed. Up to 30 units daily.  Dispense: 3 pen; Refill: 5 - metFORMIN (GLUCOPHAGE) 500 MG tablet; Take 1 tablet (500 mg total) by mouth 2 (two) times daily with a meal.  Dispense: 60 tablet; Refill: 11    Hypothyroidism, unspecified type She is taking her thyroid medication daily.  Check TSH to monitor. - TSH     Signed, [facility] [place], [place], [facility] 12/17/2017 3:03 PM

## 2017-12-18 LAB — LIPID PANEL
Cholesterol: 193 mg/dL (ref <200)
HDL: 47 mg/dL — ABNORMAL LOW (ref >50)
LDL CHOLESTEROL (CALC): 105 mg/dL — AB
NON-HDL CHOLESTEROL (CALC): 146 mg/dL — AB (ref <130)
TRIGLYCERIDES: 305 mg/dL — AB (ref <150)
Total CHOL/HDL Ratio: 4.1 (calc) (ref <5.0)

## 2017-12-18 LAB — TSH: TSH: 0.34 mIU/L — ABNORMAL LOW

## 2017-12-18 LAB — COMPLETE METABOLIC PANEL WITH GFR
AG RATIO: 1.1 (calc) (ref 1.0–2.5)
ALT: 107 U/L — ABNORMAL HIGH (ref 6–29)
AST: 84 U/L — AB (ref 10–30)
Albumin: 3.8 g/dL (ref 3.6–5.1)
Alkaline phosphatase (APISO): 71 U/L (ref 33–115)
BILIRUBIN TOTAL: 0.5 mg/dL (ref 0.2–1.2)
BUN: 12 mg/dL (ref 7–25)
CALCIUM: 9.5 mg/dL (ref 8.6–10.2)
CHLORIDE: 98 mmol/L (ref 98–110)
CO2: 26 mmol/L (ref 20–32)
Creat: 0.7 mg/dL (ref 0.50–1.10)
GFR, EST NON AFRICAN AMERICAN: 109 mL/min/{1.73_m2} (ref >=60)
GFR, Est African American: 126 mL/min/{1.73_m2} (ref >=60)
Globulin: 3.4 g/dL (calc) (ref 1.9–3.7)
Glucose, Bld: 246 mg/dL — ABNORMAL HIGH (ref 65–99)
POTASSIUM: 4.4 mmol/L (ref 3.5–5.3)
SODIUM: 135 mmol/L (ref 135–146)
Total Protein: 7.2 g/dL (ref 6.1–8.1)

## 2017-12-18 LAB — CBC WITH DIFFERENTIAL/PLATELET
BASOS ABS: 77 {cells}/uL (ref 0–200)
Basophils Relative: 0.9 %
EOS ABS: 527 {cells}/uL — AB (ref 15–500)
Eosinophils Relative: 6.2 %
HEMATOCRIT: 41.9 % (ref 35.0–45.0)
Hemoglobin: 13.9 g/dL (ref 11.7–15.5)
LYMPHS ABS: 2134 {cells}/uL (ref 850–3900)
MCH: 27.2 pg (ref 27.0–33.0)
MCHC: 33.2 g/dL (ref 32.0–36.0)
MCV: 82 fL (ref 80.0–100.0)
MPV: 11.8 fL (ref 7.5–12.5)
Monocytes Relative: 5.3 %
NEUTROS PCT: 62.5 %
Neutro Abs: 5313 cells/uL (ref 1500–7800)
Platelets: 260 10*3/uL (ref 140–400)
RBC: 5.11 10*6/uL — AB (ref 3.80–5.10)
RDW: 14.3 % (ref 11.0–15.0)
TOTAL LYMPHOCYTE: 25.1 %
WBC: 8.5 10*3/uL (ref 3.8–10.8)
WBCMIX: 451 {cells}/uL (ref 200–950)

## 2017-12-24 ENCOUNTER — Encounter: Payer: Self-pay | Admitting: Physician Assistant

## 2017-12-24 ENCOUNTER — Ambulatory Visit (INDEPENDENT_AMBULATORY_CARE_PROVIDER_SITE_OTHER): Payer: No Typology Code available for payment source | Admitting: Physician Assistant

## 2017-12-24 VITALS — BP 126/84 | HR 88 | Temp 98.3°F | Resp 18 | Ht 68.0 in

## 2017-12-24 DIAGNOSIS — E119 Type 2 diabetes mellitus without complications: Secondary | ICD-10-CM

## 2017-12-24 DIAGNOSIS — Z794 Long term (current) use of insulin: Secondary | ICD-10-CM | POA: Diagnosis not present

## 2017-12-24 MED ORDER — SYNTHROID 300 MCG PO TABS: 300.0000 ug | ORAL_TABLET | Freq: Every day | ORAL | 0 refills | Status: DC

## 2017-12-24 MED ORDER — SYNTHROID 25 MCG PO TABS: ORAL_TABLET | ORAL | 0 refills | Status: DC

## 2017-12-24 MED FILL — SYNTHROID 300 MCG TABLET: 300 | 90 days supply | Qty: 90 | Fill #0

## 2017-12-24 MED FILL — SYNTHROID 25 MCG TABLET: 25 | 90 days supply | Qty: 90 | Fill #0

## 2017-12-24 NOTE — Progress Notes (Signed)
Patient ID: Grace Hawkins MRN: [id number], DOB: Aug 29, 1975, 42 y.o. Date of Encounter: 12/24/2017,   Chief Complaint: Diabetes  HPI: 42 y.o. y/o female here for above.    12/17/2017:  She presents for complete physical exam. She reports that she has a gynecologist that she sees routinely for GYN exam.  She has hypothyroidism.  She has been taking her thyroid make medication daily.  Due to recheck TSH as well.  She also reports that she has recently been checking her blood sugar and getting high readings.  Wanted to follow that up today as well. She states that she checked it right before coming here and reading was 247.  Has been fasting for > 12 hours. Reports that she has been noticing some urinary urgency and polyuria. She states that she has already tried to decrease sugars and carbs in her diet and that if sugar and A1c are elevated that she is agreeable to start medication.  She has no other specific concerns to address.  She has 3 children.  The youngest 2 are 53 and 41 years old. 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  States that everything else has been stable other than issue with her sugar above.  Has no other concerns to address.   A/P AT THAT OV INCLUDED: Hyperglycemia Type 2 diabetes mellitus without complication, with long-term current use of insulin (HCC)   - Glucose, fingerstick (stat) - Hemoglobin A1C, fingerstick  Blood sugar and A1c were run here in the office during visit.  Discussed those results with her today. Blood sugar reading 233. A1c 11.2.  Discussed with her that on average each oral medication brings A1c down about 1 point. Discussed that we could start 4 or 5 oral medications. She feels that since A1c is this high that we should just go ahead and start metformin and insulin.  Today I am starting metformin 560m.  She is to start with just  taking it once a day.  If this causes no GI side effects, then she can increase to 1 twice a day. Today with starting Basaglar.  I have given her a sample pen of this.  Also have given her blood sugar log form and filled this out with date and corresponding units of insulin. States that her work schedule changes.  Says that that is one problem for her regarding taking her Synthroid.  It is very difficult to find a consistent time of day to take it. However, overall, she thinks that around 8 AM will be the best time for her to consistently take insulin. She will start first dose of insulin tomorrow morning--- 6/21 she will give 10 units. 6/22------ 11 units 6/23------- 12 units 6/24--------13 units 6/25---------14 units 6/26----------15 units 6/27-----------16 units  Each of these dates she will check her fasting blood sugar and document on the log form and then she will administer that number of units of insulin on that corresponding date. She will bring this blood sugar log form with her to follow-up appointment with me 1 week from today June 27 of 4:15 PM. Staff has also sent in prescription for meter and supplies.  At follow-up visit will titrate insulin to control sugar. At follow-up visit also will re-view lipid panel from today's labs and discuss statin. At follow-up visit also will discuss adding aspirin 81 mg daily and also discussed adding ACE inhibitor.  At follow-up visit also will discuss Pneumovax 23. If she has any questions or concerns in the interim she will call/follow-up.  - Insulin Glargine (BASAGLAR KWIKPEN) 100 UNIT/ML SOPN; Administer as directed. Up to 30 units daily.  Dispense: 3 pen; Refill: 5 - metFORMIN (GLUCOPHAGE) 500 MG tablet; Take 1 tablet (500 mg total) by mouth 2 (two) times daily with a meal.  Dispense: 60 tablet; Refill: 11    12/24/2017:  She reports that she has tolerated the Metformin once a day without any problem. When she took bid, had some issues,  but was able to go back to BID dosing past 2 days and tolerating it now without any further GI side effects.  She has been giving Insulin as directed above.  She has fasting BS readings.  6/22------ 11 units----------379 6/23------- 12 units--------338 6/24--------13 units-------166 6/25---------14 units-------230 6/26----------15 units-----244 6/27-----------16 units------206 ---------------------------------167    Review of Systems: Consitutional: No fever, chills, fatigue, night sweats, lymphadenopathy. No significant/unexplained weight changes. Eyes: No visual changes, eye redness, or discharge. ENT/Mouth: No ear pain, sore throat, nasal drainage, or sinus pain. Cardiovascular: No chest pressure,heaviness, tightness or squeezing, even with exertion. No increased shortness of breath or dyspnea on exertion.No palpitations, edema, orthopnea, PND. Respiratory: No cough, hemoptysis, SOB, or wheezing. Gastrointestinal: No anorexia, dysphagia, reflux, pain, nausea, vomiting, hematemesis, diarrhea, constipation, BRBPR, or melena. Breast: No mass, nodules, bulging, or retraction. No skin changes or inflammation. No nipple discharge. No lymphadenopathy. Genitourinary: No dysuria, hematuria, incontinence, vaginal discharge, pruritis, burning, abnormal bleeding, or pain. Musculoskeletal: No decreased ROM, No joint pain or swelling. No significant pain in neck, back, or extremities. Skin: No rash, pruritis, or concerning lesions. Neurological: No headache, dizziness, syncope, seizures, tremors, memory loss, coordination problems, or paresthesias. Psychological: No anxiety, depression, hallucinations, SI/HI. Endocrine: No increased fatigue. No palpitations/rapid heart rate. No significant/unexplained weight change. POSITIVE : POLYURIA, HIGH BLOOD SUGAR All other systems were reviewed and are otherwise negative.  Past Medical History:  Diagnosis Date  . Allergy   . Hypothyroid      History  reviewed. No pertinent surgical history.  Home Meds:  Outpatient Medications Prior to Visit  Medication Sig Dispense Refill  . Blood Glucose Monitoring Suppl KIT Use to check blood sugar up to 3 times daily ICD 10: R73.09, Dispense based on insurance preference 1 each 0  . glucose blood test strip Use to check blood sugar up to 3 times daily ICD10:R73.09. Dispense based on insurance preference 100 each 12  . Insulin Glargine (BASAGLAR KWIKPEN) 100 UNIT/ML SOPN Administer as directed. Up to 30 units daily. 3 pen 5  . LANCETS ULTRA THIN 30G MISC Use to check blood sugar. Dispense based on insurance preference ICD10:R73.09 100 each 5  . metFORMIN (GLUCOPHAGE) 500 MG tablet Take 1 tablet (500 mg total) by mouth 2 (two) times daily with a meal. 60 tablet 11  . SYNTHROID 25 MCG tablet TAKE 1 TABLET BY MOUTH DAILY BEFORE BREAKFAST. (TAKE WITH 300MCG TO = 325MCG DAILY) 90 tablet 0  . SYNTHROID 300 MCG tablet Take 1 tablet (300 mcg total) by mouth daily before breakfast. (Take with 25 mcg tab to = equal 325 mcg dose) 90 tablet 0  . SYNTHROID 25 MCG tablet TAKE 1 TABLET BY MOUTH DAILY BEFORE BREAKFAST. (TAKE WITH 300MCG TO = 325MCG DAILY) 90 tablet 0   No facility-administered medications prior to visit.     Allergies: No Known Allergies  Social History   Socioeconomic History  . Marital status: Married  Spouse name: Not on file  . Number of children: Not on file  . Years of education: Not on file  . Highest education level: Not on file  Occupational History  . Not on file  Social Needs  . Financial resource strain: Not on file  . Food insecurity:    Worry: Not on file    Inability: Not on file  . Transportation needs:    Medical: Not on file    Non-medical: Not on file  Tobacco Use  . Smoking status: Never Smoker  . Smokeless tobacco: Never Used  Substance and Sexual Activity  . Alcohol use: No  . Drug use: No  . Sexual activity: Not on file  Lifestyle  . Physical activity:     Days per week: Not on file    Minutes per session: Not on file  . Stress: Not on file  Relationships  . Social connections:    Talks on phone: Not on file    Gets together: Not on file    Attends religious service: Not on file    Active member of club or organization: Not on file    Attends meetings of clubs or organizations: Not on file    Relationship status: Not on file  . Intimate partner violence:    Fear of current or ex partner: Not on file    Emotionally abused: Not on file    Physically abused: Not on file    Forced sexual activity: Not on file  Other Topics Concern  . Not on file  Social History Narrative  . Not on file    History reviewed. No pertinent family history.   Physical Exam: Blood pressure 126/84, pulse 88, temperature 98.3 F (36.8 C), temperature source Oral, resp. rate 18, height 5' 8"  (1.727 m), last menstrual period 11/29/2017, SpO2 98 %., There is no height or weight on file to calculate BMI. General: Appears in no acute distress. Neck: Supple. No thyromegaly. No lymphadenopathy. Lungs: Clear bilaterally to auscultation without wheezes, rales, or rhonchi. Breathing is unlabored. Heart: RRR with S1 S2. No murmurs, rubs, or gallops. Musculoskeletal:  Strength and tone normal for age. Extremities/Skin: Warm and dry.  Neuro: Alert and oriented X 3. Moves all extremities spontaneously. Gait is normal. CNII-XII grossly in tact. Psych:  Responds to questions appropriately with a normal affect.     Assessment/Plan:  43 y.o. y/o female here for   1. Type 2 diabetes mellitus without complication, with long-term current use of insulin (HCC) Cont Metformin at 566m BID for now Increase Insulin by 2 units each day until fasting BS in range of 90 - 150.  F/U OV 3 weeks. Bring BS log.  At that OV--- ---Increase Metformin to 1,0037mBID ---Discuss ASA, ACE, Statin, Diabetic Eye Exam, Diabetic Foot Exam, Pneumovax 23  At 3 months will recheck A1C and TSH  (was borderline at recent labs) and will recheck LFTS--were probably elevated sec to fatty liver and will likely decrease as BS is controlled (triglycerides were also high)    Signed, [email or web address]/27/2019 4:51 PM

## 2018-01-14 ENCOUNTER — Ambulatory Visit (INDEPENDENT_AMBULATORY_CARE_PROVIDER_SITE_OTHER): Payer: No Typology Code available for payment source | Admitting: Physician Assistant

## 2018-01-14 ENCOUNTER — Encounter: Payer: Self-pay | Admitting: Physician Assistant

## 2018-01-14 VITALS — BP 118/88 | HR 86 | Temp 98.3°F | Resp 18 | Ht 68.0 in

## 2018-01-14 DIAGNOSIS — E119 Type 2 diabetes mellitus without complications: Secondary | ICD-10-CM

## 2018-01-14 DIAGNOSIS — Z794 Long term (current) use of insulin: Secondary | ICD-10-CM | POA: Diagnosis not present

## 2018-01-14 MED ORDER — SIMVASTATIN 10 MG PO TABS: 10.0000 mg | ORAL_TABLET | Freq: Every evening | ORAL | 2 refills | Status: DC

## 2018-01-14 MED ORDER — INSULIN GLARGINE 100 UNIT/ML SOLOSTAR PEN: PEN_INJECTOR | SUBCUTANEOUS | 11 refills | Status: DC

## 2018-01-14 MED ORDER — ASPIRIN 81 MG PO TABS: 81.0000 mg | ORAL_TABLET | Freq: Every day | ORAL | 3 refills | Status: DC

## 2018-01-14 MED ORDER — RAMIPRIL 2.5 MG PO CAPS: 2.5000 mg | ORAL_CAPSULE | Freq: Every day | ORAL | 11 refills | Status: DC

## 2018-01-14 MED ORDER — METFORMIN HCL 1000 MG PO TABS: 1000.0000 mg | ORAL_TABLET | Freq: Two times a day (BID) | ORAL | 3 refills | Status: DC

## 2018-01-14 MED FILL — metFORMIN HCL 1000 MG TABS: 1000 | 90 days supply | Qty: 180 | Fill #0

## 2018-01-14 MED FILL — UNIFINE PENTIPS 31GX3/16": 31G X 5 MM | 90 days supply | Qty: 100 | Fill #0

## 2018-01-14 MED FILL — UNIFINE PENTIPS 31GX3/16: 31G X 5 MM | 90 days supply | Qty: 100 | Fill #0

## 2018-01-14 MED FILL — RAMIPRIL 2.5 MG CAPS: 2.5 | 30 days supply | Qty: 30 | Fill #0

## 2018-01-14 MED FILL — ASPIRIN ADULT LOW STRENGTH: 81 | 90 days supply | Qty: 90 | Fill #0

## 2018-01-14 MED FILL — SIMVASTATIN 10 MG TAB: 10 | 30 days supply | Qty: 30 | Fill #0

## 2018-01-14 MED FILL — LANTUS SOLOSTAR 100 UNITS/M: 100 | 90 days supply | Qty: 36 | Fill #0

## 2018-01-14 NOTE — Progress Notes (Signed)
  Patient ID: Grace Hawkins MRN: [id number], DOB: 05/14/1976, 42 y.o. Date of Encounter: 01/14/2018,   Chief Complaint: Diabetes  HPI: 42 y.o. y/o female here for above.    12/17/2017:  She presents for complete physical exam. She reports that she has a gynecologist that she sees routinely for GYN exam.  She has hypothyroidism.  She has been taking her thyroid make medication daily.  Due to recheck TSH as well.  She also reports that she has recently been checking her blood sugar and getting high readings.  Wanted to follow that up today as well. She states that she checked it right before coming here and reading was 247.  Has been fasting for > 12 hours. Reports that she has been noticing some urinary urgency and polyuria. She states that she has already tried to decrease sugars and carbs in her diet and that if sugar and A1c are elevated that she is agreeable to start medication.  She has no other specific concerns to address.  She has 3 children.  The youngest 2 are 9 and 11 years old. 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  States that everything else has been stable other than issue with her sugar above.  Has no other concerns to address.   A/P AT THAT OV INCLUDED: Hyperglycemia Type 2 diabetes mellitus without complication, with long-term current use of insulin (HCC)   - Glucose, fingerstick (stat) - Hemoglobin A1C, fingerstick  Blood sugar and A1c were run here in the office during visit.  Discussed those results with her today. Blood sugar reading 233. A1c 11.2.  Discussed with her that on average each oral medication brings A1c down about 1 point. Discussed that we could start 4 or 5 oral medications. She feels that since A1c is this high that we should just go ahead and start metformin and insulin.  Today I am starting metformin 500mg.  She is to start with just  taking it once a day.  If this causes no GI side effects, then she can increase to 1 twice a day. Today with starting Basaglar.  I have given her a sample pen of this.  Also have given her blood sugar log form and filled this out with date and corresponding units of insulin. States that her work schedule changes.  Says that that is one problem for her regarding taking her Synthroid.  It is very difficult to find a consistent time of day to take it. However, overall, she thinks that around 8 AM will be the best time for her to consistently take insulin. She will start first dose of insulin tomorrow morning--- 6/21 she will give 10 units. 6/22------ 11 units 6/23------- 12 units 6/24--------13 units 6/25---------14 units 6/26----------15 units 6/27-----------16 units  Each of these dates she will check her fasting blood sugar and document on the log form and then she will administer that number of units of insulin on that corresponding date. She will bring this blood sugar log form with her to follow-up appointment with me 1 week from today June 27 of 4:15 PM. Staff has also sent in prescription for meter and supplies.  At follow-up visit will titrate insulin to control sugar. At follow-up visit also will re-view lipid panel from today's labs and discuss statin. At follow-up visit also will discuss adding aspirin 81 mg daily and also discussed adding ACE inhibitor.   At follow-up visit also will discuss Pneumovax 23. If she has any questions or concerns in the interim she will call/follow-up.  - Insulin Glargine (BASAGLAR KWIKPEN) 100 UNIT/ML SOPN; Administer as directed. Up to 30 units daily.  Dispense: 3 pen; Refill: 5 - metFORMIN (GLUCOPHAGE) 500 MG tablet; Take 1 tablet (500 mg total) by mouth 2 (two) times daily with a meal.  Dispense: 60 tablet; Refill: 11    12/24/2017:  She reports that she has tolerated the Metformin once a day without any problem. When she took bid, had some issues,  but was able to go back to BID dosing past 2 days and tolerating it now without any further GI side effects.  She has been giving Insulin as directed above.  She has fasting BS readings.  6/22------ 11 units----------379 6/23------- 12 units--------338 6/24--------13 units-------166 6/25---------14 units-------230 6/26----------15 units-----244 6/27-----------16 units------206 ---------------------------------167  A/P AT THAT OV: 1. Type 2 diabetes mellitus without complication, with long-term current use of insulin (HCC) Cont Metformin at 550m BID for now Increase Insulin by 2 units each day until fasting BS in range of 90 - 150.  F/U OV 3 weeks. Bring BS log.  At that OV--- ---Increase Metformin to 1,0050mBID ---Discuss ASA, ACE, Statin, Diabetic Eye Exam, Diabetic Foot Exam, Pneumovax 23  At 3 months will recheck A1C and TSH (was borderline at recent labs) and will recheck LFTS--were probably elevated sec to fatty liver and will likely decrease as BS is controlled (triglycerides were also high)    01/14/2018: She does have blood sugar readings documented on her phone. She has continued at 28 units each day ever since July 7. Fasting blood sugar readings ever since July 7 have been; 137, 136, 128, 130, 106, 112, 142, 129, 108, 113, 132, 137 Asked if she had made much diet change or whether she had mostly kept her diet the same and just added the insulin. She states that she is drinking no sweet tea and no sodas except for Pepsi Max which she says is like Coke 0 and has no sugar. She states that otherwise she has tried to decrease carbohydrates somewhat but has not made severe drastic changes. She reports that she is continuing to take the metformin 500 mg twice daily and is having no GI side effects at all. She has no specific concerns to address today.   Review of Systems: Consitutional: No fever, chills, fatigue, night sweats, lymphadenopathy. No significant/unexplained  weight changes. Eyes: No visual changes, eye redness, or discharge. ENT/Mouth: No ear pain, sore throat, nasal drainage, or sinus pain. Cardiovascular: No chest pressure,heaviness, tightness or squeezing, even with exertion. No increased shortness of breath or dyspnea on exertion.No palpitations, edema, orthopnea, PND. Respiratory: No cough, hemoptysis, SOB, or wheezing. Gastrointestinal: No anorexia, dysphagia, reflux, pain, nausea, vomiting, hematemesis, diarrhea, constipation, BRBPR, or melena. Breast: No mass, nodules, bulging, or retraction. No skin changes or inflammation. No nipple discharge. No lymphadenopathy. Genitourinary: No dysuria, hematuria, incontinence, vaginal discharge, pruritis, burning, abnormal bleeding, or pain. Musculoskeletal: No decreased ROM, No joint pain or swelling. No significant pain in neck, back, or extremities. Skin: No rash, pruritis, or concerning lesions. Neurological: No headache, dizziness, syncope, seizures, tremors, memory loss, coordination problems, or paresthesias. Psychological: No anxiety, depression, hallucinations, SI/HI. Endocrine: No increased fatigue. No palpitations/rapid heart rate. No significant/unexplained weight change. POSITIVE : POLYURIA, HIGH BLOOD SUGAR All other systems were reviewed and are otherwise negative.  Past Medical History:  Diagnosis Date  . Allergy   . Hypothyroid  No past surgical history on file.  Home Meds:  Outpatient Medications Prior to Visit  Medication Sig Dispense Refill  . Blood Glucose Monitoring Suppl KIT Use to check blood sugar up to 3 times daily ICD 10: R73.09, Dispense based on insurance preference 1 each 0  . glucose blood test strip Use to check blood sugar up to 3 times daily ICD10:R73.09. Dispense based on insurance preference 100 each 12  . Insulin Glargine (BASAGLAR KWIKPEN) 100 UNIT/ML SOPN Administer as directed. Up to 30 units daily. (Patient taking differently: Administer as directed.  Up to 30 units daily.Injecting 29 units) 3 pen 5  . LANCETS ULTRA THIN 30G MISC Use to check blood sugar. Dispense based on insurance preference ICD10:R73.09 100 each 5  . metFORMIN (GLUCOPHAGE) 500 MG tablet Take 1 tablet (500 mg total) by mouth 2 (two) times daily with a meal. 60 tablet 11  . SYNTHROID 25 MCG tablet TAKE 1 TABLET BY MOUTH DAILY BEFORE BREAKFAST. (TAKE WITH 300MCG TO = 325MCG DAILY) 90 tablet 0  . SYNTHROID 300 MCG tablet Take 1 tablet (300 mcg total) by mouth daily before breakfast. (Take with 25 mcg tab to = equal 325 mcg dose) 90 tablet 0   No facility-administered medications prior to visit.     Allergies: No Known Allergies  Social History   Socioeconomic History  . Marital status: Married    Spouse name: Not on file  . Number of children: Not on file  . Years of education: Not on file  . Highest education level: Not on file  Occupational History  . Not on file  Social Needs  . Financial resource strain: Not on file  . Food insecurity:    Worry: Not on file    Inability: Not on file  . Transportation needs:    Medical: Not on file    Non-medical: Not on file  Tobacco Use  . Smoking status: Never Smoker  . Smokeless tobacco: Never Used  Substance and Sexual Activity  . Alcohol use: No  . Drug use: No  . Sexual activity: Not on file  Lifestyle  . Physical activity:    Days per week: Not on file    Minutes per session: Not on file  . Stress: Not on file  Relationships  . Social connections:    Talks on phone: Not on file    Gets together: Not on file    Attends religious service: Not on file    Active member of club or organization: Not on file    Attends meetings of clubs or organizations: Not on file    Relationship status: Not on file  . Intimate partner violence:    Fear of current or ex partner: Not on file    Emotionally abused: Not on file    Physically abused: Not on file    Forced sexual activity: Not on file  Other Topics Concern  .  Not on file  Social History Narrative  . Not on file    No family history on file.   Physical Exam: Blood pressure 118/88, pulse 86, temperature 98.3 F (36.8 C), temperature source Oral, resp. rate 18, height 5' 8" (1.727 m), last menstrual period 12/26/2017, SpO2 99 %., There is no height or weight on file to calculate BMI. General: Appears in no acute distress. Neck: Supple. No thyromegaly. No lymphadenopathy. No carotid bruit.  Lungs: Clear bilaterally to auscultation without wheezes, rales, or rhonchi. Breathing is unlabored. Heart: RRR with S1 S2.   No murmurs, rubs, or gallops. Abdomen: Soft, non-tender, non-distended with normoactive bowel sounds. No hepatomegaly. No rebound/guarding. No obvious abdominal masses. Musculoskeletal:  Strength and tone normal for age. Extremities/Skin: Warm and dry.No edema.  Neuro: Alert and oriented X 3. Moves all extremities spontaneously. Gait is normal. CNII-XII grossly in tact. Psych:  Responds to questions appropriately with a normal affect.   Diabetic Foot Exam: Inspection is normal.  There are no problematic areas.  No wounds.  2+ DP and PT pulses bilaterally.  Sensation intact bilaterally.   Assessment/Plan:  42 y.o. y/o female here for   1. Type 2 diabetes mellitus without complication, with long-term current use of insulin (HCC)  Discussed gradually increasing metformin to 1000 mg twice daily.  She still has 500 mg tablets to use.  She will start taking 2 of these in the morning and 1 at night for several days.  Then if she is having no GI side effects will increase to 2 of these in the morning 2 at night.  If she is tolerating this then she will fill the Rx that I am sending in today for metformin 1000 mg twice daily.  Discussed adding aspirin 81 mg daily.  She reports that she has no history of PUD or bleeding disorder and is agreeable to start aspirin 81 mg daily.  Discussed adding low-dose ACE inhibitor to help protect her kidneys  from effects of diabetes long-term.  Discussed that her blood pressure is borderline low.  Last visit was 126/84.  Today 118/88.  She is agreeable to start low-dose ACE inhibitor.  Will Start Altace 2.5 mg daily.  If she develops lightheadedness then she will stop this medication.  Today I discussed Pneumovax 23.  However she does defer getting this immunization at this time.  Today I discussed diabetic eye exam.  She states that she does usually go for routine eye exam and is due for this anyway.  Told her to go ahead and schedule and to make sure to inform them that she has been diagnosed with diabetes and to send us a copy of the report for records.  Also discussed proper diabetic foot care.  Diabetic foot exam entered in quality metrics today.   She is going to schedule next visit with me soon after September 20 so that it is a full 3 months since her last set of labs which was on 12/17/2017. She states that she can schedule that at a time where she can come fasting. At that visit we will recheck CME T, FLP, TSH, A1c.  TSH (was borderline at recent labs) and will recheck LFTS--were probably elevated sec to fatty liver and will likely decrease as BS is controlled (triglycerides were also high)    - metFORMIN (GLUCOPHAGE) 1000 MG tablet; Take 1 tablet (1,000 mg total) by mouth 2 (two) times daily with a meal.  Dispense: 180 tablet; Refill: 3 - aspirin 81 MG tablet; Take 1 tablet (81 mg total) by mouth daily.  Dispense: 90 tablet; Refill: 3 - ramipril (ALTACE) 2.5 MG capsule; Take 1 capsule (2.5 mg total) by mouth daily.  Dispense: 30 capsule; Refill: 11 - simvastatin (ZOCOR) 10 MG tablet; Take 1 tablet (10 mg total) by mouth every evening.  Dispense: 30 tablet; Refill: 2   Signed, Mary Beth Dixon, PA, BSFM 01/14/2018 4:24 PM   

## 2018-03-25 ENCOUNTER — Ambulatory Visit (INDEPENDENT_AMBULATORY_CARE_PROVIDER_SITE_OTHER): Payer: No Typology Code available for payment source | Admitting: Physician Assistant

## 2018-03-25 ENCOUNTER — Encounter: Payer: Self-pay | Admitting: Physician Assistant

## 2018-03-25 VITALS — BP 134/88 | HR 76 | Temp 97.8°F | Ht 68.0 in

## 2018-03-25 DIAGNOSIS — Z794 Long term (current) use of insulin: Secondary | ICD-10-CM

## 2018-03-25 DIAGNOSIS — E119 Type 2 diabetes mellitus without complications: Secondary | ICD-10-CM

## 2018-03-25 DIAGNOSIS — E039 Hypothyroidism, unspecified: Secondary | ICD-10-CM | POA: Diagnosis not present

## 2018-03-25 NOTE — Progress Notes (Signed)
Patient ID: Grace Hawkins MRN: [id number], DOB: Dec 18, 1975, 42 y.o. Date of Encounter: 03/25/2018,   Chief Complaint: Diabetes  HPI: 42 y.o. y/o female here for above.    12/17/2017:  She presents for complete physical exam. She reports that she has a gynecologist that she sees routinely for GYN exam.  She has hypothyroidism.  She has been taking her thyroid make medication daily.  Due to recheck TSH as well.  She also reports that she has recently been checking her blood sugar and getting high readings.  Wanted to follow that up today as well. She states that she checked it right before coming here and reading was 247.  Has been fasting for > 12 hours. Reports that she has been noticing some urinary urgency and polyuria. She states that she has already tried to decrease sugars and carbs in her diet and that if sugar and A1c are elevated that she is agreeable to start medication.  She has no other specific concerns to address.  She has 3 children.  The youngest 2 are 57 and 64 years old. 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  States that everything else has been stable other than issue with her sugar above.  Has no other concerns to address.   A/P AT THAT OV INCLUDED: Hyperglycemia Type 2 diabetes mellitus without complication, with long-term current use of insulin (HCC)   - Glucose, fingerstick (stat) - Hemoglobin A1C, fingerstick  Blood sugar and A1c were run here in the office during visit.  Discussed those results with her today. Blood sugar reading 233. A1c 11.2.  Discussed with her that on average each oral medication brings A1c down about 1 point. Discussed that we could start 4 or 5 oral medications. She feels that since A1c is this high that we should just go ahead and start metformin and insulin.  Today I am starting metformin 543m.  She is to start with just  taking it once a day.  If this causes no GI side effects, then she can increase to 1 twice a day. Today with starting Basaglar.  I have given her a sample pen of this.  Also have given her blood sugar log form and filled this out with date and corresponding units of insulin. States that her work schedule changes.  Says that that is one problem for her regarding taking her Synthroid.  It is very difficult to find a consistent time of day to take it. However, overall, she thinks that around 8 AM will be the best time for her to consistently take insulin. She will start first dose of insulin tomorrow morning--- 6/21 she will give 10 units. 6/22------ 11 units 6/23------- 12 units 6/24--------13 units 6/25---------14 units 6/26----------15 units 6/27-----------16 units  Each of these dates she will check her fasting blood sugar and document on the log form and then she will administer that number of units of insulin on that corresponding date. She will bring this blood sugar log form with her to follow-up appointment with me 1 week from today June 27 of 4:15 PM. Staff has also sent in prescription for meter and supplies.  At follow-up visit will titrate insulin to control sugar. At follow-up visit also will re-view lipid panel from today's labs and discuss statin. At follow-up visit also will discuss adding aspirin 81 mg daily and also discussed adding ACE inhibitor.  At follow-up visit also will discuss Pneumovax 23. If she has any questions or concerns in the interim she will call/follow-up.  - Insulin Glargine (BASAGLAR KWIKPEN) 100 UNIT/ML SOPN; Administer as directed. Up to 30 units daily.  Dispense: 3 pen; Refill: 5 - metFORMIN (GLUCOPHAGE) 500 MG tablet; Take 1 tablet (500 mg total) by mouth 2 (two) times daily with a meal.  Dispense: 60 tablet; Refill: 11    12/24/2017:  She reports that she has tolerated the Metformin once a day without any problem. When she took bid, had some issues,  but was able to go back to BID dosing past 2 days and tolerating it now without any further GI side effects.  She has been giving Insulin as directed above.  She has fasting BS readings.  6/22------ 11 units----------379 6/23------- 12 units--------338 6/24--------13 units-------166 6/25---------14 units-------230 6/26----------15 units-----244 6/27-----------16 units------206 ---------------------------------167  A/P AT THAT OV: 1. Type 2 diabetes mellitus without complication, with long-term current use of insulin (HCC) Cont Metformin at 567m BID for now Increase Insulin by 2 units each day until fasting BS in range of 90 - 150.  F/U OV 3 weeks. Bring BS log.  At that OV--- ---Increase Metformin to 1,0022mBID ---Discuss ASA, ACE, Statin, Diabetic Eye Exam, Diabetic Foot Exam, Pneumovax 23  At 3 months will recheck A1C and TSH (was borderline at recent labs) and will recheck LFTS--were probably elevated sec to fatty liver and will likely decrease as BS is controlled (triglycerides were also high)    01/14/2018: She does have blood sugar readings documented on her phone. She has continued at 28 units each day ever since July 7. Fasting blood sugar readings ever since July 7 have been; 137, 136, 128, 130, 106, 112, 142, 129, 108, 113, 132, 137 Asked if she had made much diet change or whether she had mostly kept her diet the same and just added the insulin. She states that she is drinking no sweet tea and no sodas except for Pepsi Max which she says is like Coke 0 and has no sugar. She states that otherwise she has tried to decrease carbohydrates somewhat but has not made severe drastic changes. She reports that she is continuing to take the metformin 500 mg twice daily and is having no GI side effects at all. She has no specific concerns to address today.   03/25/2018: At last visit I had discussed ACE inhibitor and statin and she had agreed.  Dates today that she wants to be  honest and let me know that she decided to hold off on adding those.  She did add the aspirin 81 mg daily and is taking this and is continuing to take metformin 1000 mg twice daily and administering insulin at 30 units daily. She does have blood sugars documented on her phone.  Checking fastings.  Recent readings are: 127, 152, 129, 103, 118, 128, 123, 98, 120, 116, 128. Also she does continue to take her thyroid medication daily. She is here for routine visit.  Has no specific concerns to address.     Review of Systems: Consitutional: No fever, chills, fatigue, night sweats, lymphadenopathy. No significant/unexplained weight changes. Eyes: No visual changes, eye redness, or discharge. ENT/Mouth: No ear pain, sore throat, nasal drainage, or sinus pain. Cardiovascular: No chest pressure,heaviness, tightness or squeezing, even with exertion. No increased shortness of breath or dyspnea on exertion.No palpitations, edema, orthopnea, PND. Respiratory: No cough, hemoptysis, SOB, or wheezing. Gastrointestinal: No anorexia, dysphagia, reflux, pain, nausea,  vomiting, hematemesis, diarrhea, constipation, BRBPR, or melena. Breast: No mass, nodules, bulging, or retraction. No skin changes or inflammation. No nipple discharge. No lymphadenopathy. Genitourinary: No dysuria, hematuria, incontinence, vaginal discharge, pruritis, burning, abnormal bleeding, or pain. Musculoskeletal: No decreased ROM, No joint pain or swelling. No significant pain in neck, back, or extremities. Skin: No rash, pruritis, or concerning lesions. Neurological: No headache, dizziness, syncope, seizures, tremors, memory loss, coordination problems, or paresthesias. Psychological: No anxiety, depression, hallucinations, SI/HI. Endocrine: No increased fatigue. No palpitations/rapid heart rate. No significant/unexplained weight change. POSITIVE : POLYURIA, HIGH BLOOD SUGAR All other systems were reviewed and are otherwise  negative.  Past Medical History:  Diagnosis Date  . Allergy   . Hypothyroid      History reviewed. No pertinent surgical history.  Home Meds:  Outpatient Medications Prior to Visit  Medication Sig Dispense Refill  . aspirin 81 MG tablet Take 1 tablet (81 mg total) by mouth daily. 90 tablet 3  . Blood Glucose Monitoring Suppl KIT Use to check blood sugar up to 3 times daily ICD 10: R73.09, Dispense based on insurance preference 1 each 0  . glucose blood test strip Use to check blood sugar up to 3 times daily ICD10:R73.09. Dispense based on insurance preference 100 each 12  . Insulin Glargine (LANTUS SOLOSTAR) 100 UNIT/ML Solostar Pen Administer 25 - 40 units daily as directed. 15 mL 11  . LANCETS ULTRA THIN 30G MISC Use to check blood sugar. Dispense based on insurance preference ICD10:R73.09 100 each 5  . metFORMIN (GLUCOPHAGE) 1000 MG tablet Take 1 tablet (1,000 mg total) by mouth 2 (two) times daily with a meal. 180 tablet 3  . SYNTHROID 25 MCG tablet TAKE 1 TABLET BY MOUTH DAILY BEFORE BREAKFAST. (TAKE WITH 300MCG TO = 325MCG DAILY) 90 tablet 0  . SYNTHROID 300 MCG tablet Take 1 tablet (300 mcg total) by mouth daily before breakfast. (Take with 25 mcg tab to = equal 325 mcg dose) 90 tablet 0  . ramipril (ALTACE) 2.5 MG capsule Take 1 capsule (2.5 mg total) by mouth daily. (Patient not taking: Reported on 03/25/2018) 30 capsule 11  . simvastatin (ZOCOR) 10 MG tablet Take 1 tablet (10 mg total) by mouth every evening. (Patient not taking: Reported on 03/25/2018) 30 tablet 2   No facility-administered medications prior to visit.     Allergies: No Known Allergies  Social History   Socioeconomic History  . Marital status: Married    Spouse name: Not on file  . Number of children: Not on file  . Years of education: Not on file  . Highest education level: Not on file  Occupational History  . Not on file  Social Needs  . Financial resource strain: Not on file  . Food insecurity:     Worry: Not on file    Inability: Not on file  . Transportation needs:    Medical: Not on file    Non-medical: Not on file  Tobacco Use  . Smoking status: Never Smoker  . Smokeless tobacco: Never Used  Substance and Sexual Activity  . Alcohol use: No  . Drug use: No  . Sexual activity: Not on file  Lifestyle  . Physical activity:    Days per week: Not on file    Minutes per session: Not on file  . Stress: Not on file  Relationships  . Social connections:    Talks on phone: Not on file    Gets together: Not on file  Attends religious service: Not on file    Active member of club or organization: Not on file    Attends meetings of clubs or organizations: Not on file    Relationship status: Not on file  . Intimate partner violence:    Fear of current or ex partner: Not on file    Emotionally abused: Not on file    Physically abused: Not on file    Forced sexual activity: Not on file  Other Topics Concern  . Not on file  Social History Narrative  . Not on file    History reviewed. No pertinent family history.   Physical Exam: Blood pressure 134/88, pulse 76, temperature 97.8 F (36.6 C), temperature source Oral, height 5' 8"  (1.727 m), last menstrual period 03/19/2018, SpO2 100 %., There is no height or weight on file to calculate BMI. General:  Obese WF. Appears in no acute distress. Neck: Supple. No thyromegaly. No lymphadenopathy.  No carotid bruits. Lungs: Clear bilaterally to auscultation without wheezes, rales, or rhonchi. Breathing is unlabored. Heart: RRR with S1 S2. No murmurs, rubs, or gallops. Musculoskeletal:  Strength and tone normal for age. Extremities/Skin: Warm and dry.  No LE edema.  Neuro: Alert and oriented X 3. Moves all extremities spontaneously. Gait is normal. CNII-XII grossly in tact. Psych:  Responds to questions appropriately with a normal affect.     Diabetic Foot Exam: Inspection is normal.  There are no problematic areas.  No wounds.  2+  DP and PT pulses bilaterally.  Sensation intact bilaterally.   Assessment/Plan:  42 y.o. y/o female here for    1. Type 2 diabetes mellitus without complication, with long-term current use of insulin (HCC) She is on metformin 1000 mg twice daily She is on insulin 30 units daily She is on aspirin 81 mg daily Last visit we discussed statin and low-dose ACE inhibitor.  However, at this time she wants to hold off on adding these. Fasting blood sugars look good on her log.  Will check A1c to monitor. Also did discuss need for diabetic eye exam.  She also is educated regarding diabetic foot care. - Hemoglobin A1c  2. Hypothyroidism, unspecified type She is on her thyroid medication daily.  Check TSH to monitor. - TSH  Will plan routine follow-up in 3 months.  Follow-up sooner if needed.    [address] [place], [place], [facility] 03/25/2018 12:39 PM

## 2018-03-26 LAB — HEMOGLOBIN A1C
HEMOGLOBIN A1C: 5.9 %{Hb} — AB (ref <5.7)
Mean Plasma Glucose: 123 (calc)
eAG (mmol/L): 6.8 (calc)

## 2018-03-26 LAB — TSH: TSH: 1.05 m[IU]/L

## 2018-04-02 MED FILL — FREESTYLE LANCETS: 30 days supply | Qty: 100 | Fill #1

## 2018-04-02 MED FILL — FREESTYLE LITE TEST STRIP: 30 days supply | Qty: 100 | Fill #1

## 2018-04-22 ENCOUNTER — Other Ambulatory Visit: Payer: Self-pay | Admitting: Physician Assistant

## 2018-04-22 MED FILL — metFORMIN HCL 1000 MG TABS: 1000 | 90 days supply | Qty: 180 | Fill #1

## 2018-04-22 MED FILL — ASPIRIN ADULT LOW STRENGTH: 81 | 90 days supply | Qty: 90 | Fill #1

## 2018-04-22 MED FILL — UNIFINE PENTIPS 31GX3/16: 31G X 5 MM | 90 days supply | Qty: 100 | Fill #1

## 2018-04-22 MED FILL — SYNTHROID 25 MCG TABLET: 25 | 90 days supply | Qty: 90 | Fill #0

## 2018-04-22 MED FILL — UNIFINE PENTIPS 31GX3/16": 31G X 5 MM | 90 days supply | Qty: 100 | Fill #1

## 2018-04-22 MED FILL — LANTUS SOLOSTAR 100 UNITS/M: 100 | 90 days supply | Qty: 36 | Fill #1

## 2018-05-04 MED FILL — SYNTHROID 300 MCG TABLET: 300 | 90 days supply | Qty: 90 | Fill #0

## 2018-07-07 ENCOUNTER — Ambulatory Visit: Payer: No Typology Code available for payment source | Admitting: Physician Assistant

## 2018-07-20 ENCOUNTER — Other Ambulatory Visit: Payer: Self-pay | Admitting: Family Medicine

## 2018-07-20 MED FILL — ASPIRIN LOW DOSE 81 MG TBEC: 81 | 90 days supply | Qty: 90 | Fill #2

## 2018-07-20 MED FILL — FREESTYLE LANCETS: 30 days supply | Qty: 100 | Fill #2

## 2018-07-20 MED FILL — metFORMIN HCL 1000 MG TABS: 1000 | 90 days supply | Qty: 180 | Fill #2

## 2018-07-20 MED FILL — UNIFINE PENTIPS 31GX3/16: 31G X 5 MM | 90 days supply | Qty: 100 | Fill #2

## 2018-07-20 MED FILL — FREESTYLE LITE TEST STRIP: 30 days supply | Qty: 100 | Fill #2

## 2018-07-20 MED FILL — LANTUS SOLOSTAR 100 UNITS/M: 100 | 90 days supply | Qty: 36 | Fill #2

## 2018-07-20 MED FILL — UNIFINE PENTIPS 31GX3/16": 31G X 5 MM | 90 days supply | Qty: 100 | Fill #2

## 2018-07-27 MED FILL — SYNTHROID 300 MCG TABLET: 300 | 90 days supply | Qty: 90 | Fill #0

## 2018-08-03 MED FILL — SYNTHROID 25 MCG TABLET: 25 | 90 days supply | Qty: 90 | Fill #0

## 2018-08-25 ENCOUNTER — Ambulatory Visit: Payer: No Typology Code available for payment source | Admitting: Family Medicine

## 2018-09-16 ENCOUNTER — Encounter: Payer: Self-pay | Admitting: Family Medicine

## 2018-09-16 ENCOUNTER — Ambulatory Visit (INDEPENDENT_AMBULATORY_CARE_PROVIDER_SITE_OTHER): Payer: No Typology Code available for payment source | Admitting: Family Medicine

## 2018-09-16 ENCOUNTER — Other Ambulatory Visit: Payer: Self-pay

## 2018-09-16 VITALS — BP 132/80 | HR 83 | Temp 98.3°F | Resp 18 | Ht 68.0 in

## 2018-09-16 DIAGNOSIS — Z794 Long term (current) use of insulin: Secondary | ICD-10-CM

## 2018-09-16 DIAGNOSIS — E039 Hypothyroidism, unspecified: Secondary | ICD-10-CM | POA: Diagnosis not present

## 2018-09-16 DIAGNOSIS — E119 Type 2 diabetes mellitus without complications: Secondary | ICD-10-CM | POA: Diagnosis not present

## 2018-09-16 DIAGNOSIS — E782 Mixed hyperlipidemia: Secondary | ICD-10-CM | POA: Insufficient documentation

## 2018-09-16 DIAGNOSIS — IMO0001 Reserved for inherently not codable concepts without codable children: Secondary | ICD-10-CM

## 2018-09-16 MED ORDER — SYNTHROID 25 MCG PO TABS: 25.0000 ug | ORAL_TABLET | Freq: Every day | ORAL | 3 refills | Status: DC

## 2018-09-16 MED ORDER — METFORMIN HCL 1000 MG PO TABS: 1000.0000 mg | ORAL_TABLET | Freq: Two times a day (BID) | ORAL | 3 refills | Status: DC

## 2018-09-16 MED ORDER — LANCETS ULTRA THIN 30G MISC: 5 refills | Status: DC

## 2018-09-16 MED ORDER — SYNTHROID 300 MCG PO TABS: ORAL_TABLET | ORAL | 0 refills | Status: DC

## 2018-09-16 MED ORDER — INSULIN GLARGINE 100 UNIT/ML SOLOSTAR PEN: PEN_INJECTOR | SUBCUTANEOUS | 11 refills | Status: DC

## 2018-09-16 MED ORDER — GLUCOSE BLOOD VI STRP: ORAL_STRIP | 12 refills | Status: DC

## 2018-09-16 MED ORDER — BLOOD GLUCOSE MONITORING SUPPL KIT: PACK | 0 refills | Status: AC

## 2018-09-16 NOTE — Patient Instructions (Signed)
I am going to call the pharmacy and check on the trulicity dosing and interactions with insulin and I will send you a mychart message - I just want to make sure we don't need to lower insulin initially when you start the trulicity.  I will also route you your labs and adjust the diagnosis when I get the lab results.  I would want to check you with office visit or e-visit/mychart message in the next months - and redo labs in 3 months

## 2018-09-16 NOTE — Progress Notes (Signed)
Patient ID: Grace Hawkins, female    DOB: May 01, 1976, 43 y.o.   MRN: [id number]  PCP: Delsa Grana, PA-C  Chief Complaint  Patient presents with  . Diabetes  . Hypothyroidism    Subjective:   Grace Hawkins is a 43 y.o. female, presents to clinic with CC of routine f/up for DM, obesity, hypothyroid and recheck of HTN/HLD meds  Patient was last seen by her PCP Sept 2019, unfortunately PA is no longer with our practice.  Pt only recently (in the past year) started insulin for DM, was advised to take ACEI and statin, but has not been taking.   DM eye exam - she has not gone yet, but states she will go to specialist and notify us where and when so we can get record. She is taking Metformin BID and Insulin 30 units most days -  "if sugars low" she will decrease a little bit to 26-30.  She states low for her is around 80, most fasting blood sugar readings are between 80-115.  She does not have glucometer with her.  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 Patient has not tried any other medications besides metformin and then starting basal insulin.  She would be interested in other medications especially GLP- 1 inhibitors such as trulicity/victoza?  She will check with pharmacy/insurance/formulary  She does have likely morbid obesity but she does not allow [place] to weigh her in clinic because she is concerned about her private health information being visible by coworkers.  She does state that with a lot of effort for diet and exercise over the past 6 to 12 months she has lost 35 lbs.  She denies any fatigue, polyuria, polydipsia, extreme thirst.  Her blood pressure is good usually runs under 130/80 .  She has never had high blood pressure and was advised  to take a low-dose Altase because of diabetes but she has not been taking.  Same with her cholesterol she was told that her cholesterol was excellent and guidelines and standard of care recommend taking a statin but she has not been taking.  Hx of hypothyroid -she is currently taking 325 mcg - and needs brand-synthroid.  Recently increased dose with extra 25 mcg, has not had thyroid levels checked in 6 months.  She is again, trying to take meds in the am on empty stomach, but that varies and she usually takes upon waking before going to work.  She has not had any palpitations, jitteriness, heat intolerance, visual disturbances, near syncope, CP, diarrhea.  She would like to stop the extra dose of medication due to cost if she is able to.  Patient denies any chest pain, shortness of breath, lower extremity edema, visual disturbances, headaches, near syncope, dizziness, numbness, tingling, exertional symptoms.  No personal hx of cardiac disease.    She states that she had thyroid disease diagnosed as a child.     Patient Active Problem List   Diagnosis Date Noted  . Hyperlipidemia, mixed 09/16/2018  . IDDM (insulin dependent diabetes mellitus) ([place]) 12/17/2017  . Pyelonephritis 11/23/2013  . Elevated liver function tests 11/23/2013  . Hypothyroid     Current Meds  Medication Sig  .  aspirin 81 MG tablet Take 1 tablet (81 mg total) by mouth daily.  . Blood Glucose Monitoring Suppl KIT Use to check blood sugar up to 3 times daily ICD 10: R73.09, Dispense based on insurance preference  . glucose blood test strip Use to check blood sugar up to 3 times daily ICD10:R73.09. Dispense based on insurance preference  . Insulin Glargine (LANTUS SOLOSTAR) 100 UNIT/ML Solostar Pen Administer 25 - 40 units daily as directed.  Marland Kitchen LANCETS ULTRA THIN 30G MISC Use to check blood sugar. Dispense based on insurance preference ICD10:R73.09  . metFORMIN (GLUCOPHAGE) 1000 MG tablet Take 1 tablet (1,000 mg total) by  mouth 2 (two) times daily with a meal.  . SYNTHROID 25 MCG tablet TAKE 1 TABLET BY MOUTH DAILY BEFORE BREAKFAST. (TAKE WITH 300MCG TO = 325MCG DAILY)  . SYNTHROID 300 MCG tablet TAKE 1 TABLET BY MOUTH DAILY BEFORE BREAKFAST. (TAKE WITH 25 MCG TAB TO = EQUAL 325 MCG DOSE)     Review of Systems  Constitutional: Negative.  Negative for activity change, appetite change, fatigue and unexpected weight change.  HENT: Negative.   Eyes: Negative.  Negative for visual disturbance.  Respiratory: Negative.  Negative for cough, chest tightness, shortness of breath and wheezing.   Cardiovascular: Negative.  Negative for chest pain, palpitations and leg swelling.  Gastrointestinal: Negative.  Negative for abdominal pain, blood in stool, diarrhea, nausea and vomiting.  Endocrine: Negative.  Negative for cold intolerance, heat intolerance, polydipsia, polyphagia and polyuria.  Genitourinary: Negative.  Negative for difficulty urinating.  Musculoskeletal: Negative.  Negative for arthralgias and myalgias.  Skin: Negative.  Negative for color change, pallor and rash.  Allergic/Immunologic: Negative.  Negative for immunocompromised state.  Neurological: Negative.  Negative for dizziness, syncope, weakness, light-headedness and headaches.  Hematological: Negative.  Negative for adenopathy. Does not bruise/bleed easily.  Psychiatric/Behavioral: Negative.   All other systems reviewed and are negative.      Objective:    Vitals:   09/16/18 1027  BP: 132/80  Pulse: 83  Resp: 18  Temp: 98.3 F (36.8 C)  SpO2: 98%  Height: _0  (1.727 m)      Physical Exam Vitals signs and nursing note reviewed.  Constitutional:      General: She is not in acute distress.    Appearance: Normal appearance. She is well-developed and well-groomed. She is obese. She is not ill-appearing, toxic-appearing or diaphoretic.     Comments: Well appearing, pleasant female, NAD  HENT:     Head: Normocephalic and atraumatic.      Jaw: There is normal jaw occlusion.     Right Ear: External ear normal.     Left Ear: External ear normal.     Nose: Nose normal. No congestion or rhinorrhea.     Mouth/Throat:     Mouth: Mucous membranes are moist.     Pharynx: Oropharynx is clear. Uvula midline. No posterior oropharyngeal erythema.  Eyes:     General: Lids are normal. No scleral icterus.    Conjunctiva/sclera: Conjunctivae normal.     Pupils: Pupils are equal, round, and reactive to light.  Neck:     Musculoskeletal: Normal range of motion and neck supple.     Thyroid: No thyromegaly.     Trachea: Phonation normal. No tracheal deviation.  Cardiovascular:     Rate and Rhythm: Normal rate and regular rhythm.     Pulses: Normal pulses.          Radial pulses are 2+ on the right  side and 2+ on the left side.       Posterior tibial pulses are 2+ on the right side and 2+ on the left side.     Heart sounds: Normal heart sounds. No murmur. No friction rub. No gallop.   Pulmonary:     Effort: Pulmonary effort is normal. No tachypnea or respiratory distress.     Breath sounds: Normal breath sounds and air entry. No stridor. No decreased breath sounds, wheezing, rhonchi or rales.  Chest:     Chest wall: No tenderness.  Abdominal:     General: Bowel sounds are normal.     Palpations: Abdomen is soft.     Tenderness: There is no abdominal tenderness.  Musculoskeletal: Normal range of motion.     Right lower leg: No edema.     Left lower leg: No edema.  Skin:    General: Skin is warm and dry.     Capillary Refill: Capillary refill takes less than 2 seconds.     Coloration: Skin is not cyanotic, jaundiced or pale.     Findings: No rash.  Neurological:     Mental Status: She is alert.     Cranial Nerves: No dysarthria or facial asymmetry.     Motor: No weakness, tremor or abnormal muscle tone.     Coordination: Coordination is intact.     Gait: Gait normal.  Psychiatric:        Mood and Affect: Mood normal.         Speech: Speech normal.        Behavior: Behavior normal. Behavior is cooperative.           Assessment & Plan:   Problem List Items Addressed This Visit      Endocrine   Hypothyroid    Taking meds as prescribed, no SE Recheck TSH with dose changes 6 months ago      Relevant Medications   SYNTHROID 25 MCG tablet   SYNTHROID 300 MCG tablet   Other Relevant Orders   Hemoglobin A1c   CBC with Differential/Platelet   COMPLETE METABOLIC PANEL WITH GFR   Lipid panel   TSH   CBC with Differential/Platelet (Completed)   COMPLETE METABOLIC PANEL WITH GFR (Completed)   Lipid panel (Completed)   TSH (Completed)   Hemoglobin A1c (Completed)   IDDM (insulin dependent diabetes mellitus) ([place]) - Primary    DM with A1C 11.2 on 12/17/17 had insulin basaglar added by past PCP, here for f/up Compliant with metformin 1000 mg BID w/o SE Reported fasting CBG's sound well controlled with daily 25-30 units insulin No hypoglycemic episodes, pt working on diet and exercise, 35 lbs lost! Not taking ACEI or statin, but is taking 81 mg ASA  Recheck labs CMP, lipid, and A1C  With hx of hypothyroid since childhood, and lifelong hx of metabolic and endocrine pathology, would like to try adding GLP-1 inhibitor, will likely need to decrease and watch closely basal insulin.  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  Dosing, SE, follow up reviewed and pt verbalized understanding. Hope it will help with weight loss, address possible other aspects of DM for pt.  Pt still advised ACEI and statin indicated to decrease risk of heart disease or stroke Prefer ACEI and Statin with DM meds, and I do not recommend ASA for primary prevention due to risk of bleeding.  Pt to arrange DM eye exam  and notify us Refuses pneumococcal vaccine, recommended with DM      Relevant Medications   metFORMIN (GLUCOPHAGE) 1000 MG  tablet   Blood Glucose Monitoring Suppl KIT   glucose blood test strip   Lancets Ultra Thin 30G MISC   Insulin Glargine (LANTUS SOLOSTAR) 100 UNIT/ML Solostar Pen   UNIFINE PENTIPS 31G X 5 MM MISC   Other Relevant Orders   Hemoglobin A1c   CBC with Differential/Platelet   COMPLETE METABOLIC PANEL WITH GFR   Lipid panel   TSH   CBC with Differential/Platelet (Completed)   COMPLETE METABOLIC PANEL WITH GFR (Completed)   Lipid panel (Completed)   TSH (Completed)   Hemoglobin A1c (Completed)     Other   Hyperlipidemia, mixed    Last labs show HLD Recheck CMP and lipid today Pt not on statin Working on diet and exercise, 35 lbs weight loss in past 6 months  Goal LDL < 70, HDL > 50 Statin indicated regardless of labs due to DM      Relevant Orders   CBC with Differential/Platelet (Completed)   COMPLETE METABOLIC PANEL WITH GFR (Completed)   Lipid panel (Completed)   TSH (Completed)   Hemoglobin A1c (Completed)      Obesity - pt will not allow [place] to weigh her to obtain BMI - she is actively working on diet and exercise, would like to add GLP-1, continue to monitor intake, calories, decrease portion of food when starting GLP-1 meds, pt to monitor SE and weight, f/up in 1 month.      Delsa Grana, PA-C 09/16/18 10:52 AM

## 2018-09-17 LAB — COMPLETE METABOLIC PANEL WITH GFR
AG Ratio: 1.2 (calc) (ref 1.0–2.5)
ALT: 20 U/L (ref 6–29)
AST: 17 U/L (ref 10–30)
Albumin: 3.8 g/dL (ref 3.6–5.1)
Alkaline phosphatase (APISO): 53 U/L (ref 31–125)
BUN: 15 mg/dL (ref 7–25)
CO2: 26 mmol/L (ref 20–32)
Calcium: 9.4 mg/dL (ref 8.6–10.2)
Chloride: 103 mmol/L (ref 98–110)
Creat: 0.82 mg/dL (ref 0.50–1.10)
GFR, Est African American: 104 mL/min/{1.73_m2} (ref >=60)
GFR, Est Non African American: 90 mL/min/{1.73_m2} (ref >=60)
Globulin: 3.1 g/dL (calc) (ref 1.9–3.7)
Glucose, Bld: 101 mg/dL — ABNORMAL HIGH (ref 65–99)
Potassium: 4.8 mmol/L (ref 3.5–5.3)
Sodium: 139 mmol/L (ref 135–146)
Total Bilirubin: 0.3 mg/dL (ref 0.2–1.2)
Total Protein: 6.9 g/dL (ref 6.1–8.1)

## 2018-09-17 LAB — TSH: TSH: 0.05 mIU/L — ABNORMAL LOW

## 2018-09-17 LAB — CBC WITH DIFFERENTIAL/PLATELET
Absolute Monocytes: 694 cells/uL (ref 200–950)
Basophils Absolute: 57 cells/uL (ref 0–200)
Basophils Relative: 0.6 %
Eosinophils Absolute: 523 cells/uL — ABNORMAL HIGH (ref 15–500)
Eosinophils Relative: 5.5 %
HCT: 37.5 % (ref 35.0–45.0)
Hemoglobin: 12.3 g/dL (ref 11.7–15.5)
LYMPHS ABS: 2271 {cells}/uL (ref 850–3900)
MCH: 26.1 pg — ABNORMAL LOW (ref 27.0–33.0)
MCHC: 32.8 g/dL (ref 32.0–36.0)
MCV: 79.6 fL — ABNORMAL LOW (ref 80.0–100.0)
MPV: 11.1 fL (ref 7.5–12.5)
Monocytes Relative: 7.3 %
NEUTROS PCT: 62.7 %
Neutro Abs: 5957 cells/uL (ref 1500–7800)
Platelets: 318 10*3/uL (ref 140–400)
RBC: 4.71 10*6/uL (ref 3.80–5.10)
RDW: 14.6 % (ref 11.0–15.0)
Total Lymphocyte: 23.9 %
WBC: 9.5 10*3/uL (ref 3.8–10.8)

## 2018-09-17 LAB — LIPID PANEL
Cholesterol: 157 mg/dL (ref <200)
HDL: 44 mg/dL — ABNORMAL LOW (ref >=50)
LDL Cholesterol (Calc): 83 mg/dL (calc)
NON-HDL CHOLESTEROL (CALC): 113 mg/dL (ref <130)
Total CHOL/HDL Ratio: 3.6 (calc) (ref <5.0)
Triglycerides: 205 mg/dL — ABNORMAL HIGH (ref <150)

## 2018-09-17 LAB — HEMOGLOBIN A1C
Hgb A1c MFr Bld: 5.6 % of total Hgb (ref <5.7)
MEAN PLASMA GLUCOSE: 114 (calc)
eAG (mmol/L): 6.3 (calc)

## 2018-09-21 ENCOUNTER — Encounter: Payer: Self-pay | Admitting: Family Medicine

## 2018-09-21 NOTE — Assessment & Plan Note (Signed)
Last labs show HLD Recheck CMP and lipid today Pt not on statin Working on diet and exercise, 35 lbs weight loss in past 6 months  Goal LDL < 70, HDL > 50 Statin indicated regardless of labs due to DM

## 2018-09-21 NOTE — Assessment & Plan Note (Signed)
Taking meds as prescribed, no SE Recheck TSH with dose changes 6 months ago

## 2018-09-21 NOTE — Assessment & Plan Note (Addendum)
DM with A1C 11.2 on 12/17/17 had insulin basaglar added by past PCP, here for f/up Compliant with metformin 1000 mg BID w/o SE Reported fasting CBG's sound well controlled with daily 25-30 units insulin No hypoglycemic episodes, pt working on diet and exercise, 35 lbs lost! Not taking ACEI or statin, but is taking 81 mg ASA  Recheck labs CMP, lipid, and A1C  With hx of hypothyroid since childhood, and lifelong hx of metabolic and endocrine pathology, would like to try adding GLP-1 inhibitor, will likely need to decrease and watch closely basal insulin.  Pt is an [employer], has understanding of plan and monitoring, meds and interactions discussed here today and handout with more information given.  Dosing, SE, follow up reviewed and pt verbalized understanding. Hope it will help with weight loss, address possible other aspects of DM for pt.  Pt still advised ACEI and statin indicated to decrease risk of heart disease or stroke Prefer ACEI and Statin with DM meds, and I do not recommend ASA for primary prevention due to risk of bleeding.  Pt to arrange DM eye exam and notify us Refuses pneumococcal vaccine, recommended with DM

## 2018-09-22 ENCOUNTER — Encounter: Payer: Self-pay | Admitting: Family Medicine

## 2018-09-29 ENCOUNTER — Other Ambulatory Visit: Payer: Self-pay

## 2018-09-29 ENCOUNTER — Other Ambulatory Visit: Payer: Self-pay | Admitting: Family Medicine

## 2018-09-29 MED ORDER — DULAGLUTIDE 0.75 MG/0.5ML ~~LOC~~ SOAJ: 0.7500 mg | SUBCUTANEOUS | 1 refills | Status: DC

## 2018-09-29 MED FILL — ASPIRIN ADULT LOW STRENGTH: 81 | 90 days supply | Qty: 90 | Fill #3

## 2018-09-29 MED FILL — LANTUS SOLOSTAR 100 UNITS/M: 100 | 90 days supply | Qty: 36 | Fill #3

## 2018-09-29 MED FILL — UNIFINE PENTIPS 31GX3/16": 31G X 5 MM | 90 days supply | Qty: 100 | Fill #3

## 2018-09-29 MED FILL — FREESTYLE LITE TEST STRIP: 30 days supply | Qty: 100 | Fill #3

## 2018-09-29 MED FILL — metFORMIN HCL 1000 MG TABS: 1000 | 90 days supply | Qty: 180 | Fill #3

## 2018-09-29 MED FILL — UNIFINE PENTIPS 31GX3/16: 31G X 5 MM | 90 days supply | Qty: 100 | Fill #3

## 2018-09-29 MED FILL — FREESTYLE LANCETS: 30 days supply | Qty: 100 | Fill #3

## 2018-09-29 NOTE — Progress Notes (Signed)
Pt got trulicity samples in clinic and trulicity 0.75 mg dose x 2 sent to pharmacy Pt was previously advised to decrease basal insulin 5-10 unit when starting trulicity to avoid possibly hypoglycemia.   Would do 0.75 dose for the next month and f/up - to check fasting sugars, lantus dose, and see if she tolerates SE for dose increase.

## 2018-10-01 MED FILL — TRULICITY 0.75 MG/0.5 ML PE: 0.75 | 28 days supply | Qty: 2 | Fill #0

## 2018-10-23 ENCOUNTER — Other Ambulatory Visit: Payer: Self-pay | Admitting: Family Medicine

## 2018-10-23 MED FILL — FREESTYLE LITE TEST STRIP: 30 days supply | Qty: 100 | Fill #4

## 2018-10-23 MED FILL — FREESTYLE LANCETS: 30 days supply | Qty: 100 | Fill #4

## 2018-10-25 MED FILL — TRULICITY 0.75 MG/0.5 ML PE: 0.75 | 28 days supply | Qty: 2 | Fill #0

## 2018-11-09 ENCOUNTER — Encounter: Payer: Self-pay | Admitting: Family Medicine

## 2018-11-09 MED FILL — SYNTHROID 300 MCG TABLET: 300 | 90 days supply | Qty: 90 | Fill #0

## 2018-11-10 MED ORDER — DULAGLUTIDE 1.5 MG/0.5ML ~~LOC~~ SOAJ: 1.5000 mg | SUBCUTANEOUS | 2 refills | Status: DC

## 2018-11-10 NOTE — Telephone Encounter (Signed)
Pt sent mychart msg asking for dose increase on Trulicity, tolerating well, Lantus is 22-24 units.  Sent through Rx.  Requested pt make in person OV in the next 3 months to recheck her, repeat labs, and eval in person with the med changes.

## 2018-11-16 ENCOUNTER — Other Ambulatory Visit: Payer: Self-pay | Admitting: Family Medicine

## 2018-11-16 MED FILL — TRULICITY 1.5 MG/0.5 ML PEN: 1.5 | 28 days supply | Qty: 2 | Fill #0

## 2018-11-16 NOTE — Telephone Encounter (Signed)
Requested Prescriptions   Pending Prescriptions Disp Refills  . TRULICITY 0.75 MG/0.5ML SOPN [Pharmacy Med Name: TRULICITY 0.75 MG/0.5 ML PE 0.75 SOPN] 2 mL 1    Sig: INJECT 0.75 MG INTO THE SKIN ONCE A WEEK

## 2018-12-28 MED FILL — metFORMIN HCL 1000 MG TABS: 1000 | 90 days supply | Qty: 180 | Fill #0

## 2018-12-28 MED FILL — LANTUS SOLOSTAR 100 UNITS/M: 100 | 90 days supply | Qty: 36 | Fill #4

## 2018-12-29 MED FILL — TRULICITY 1.5 MG/0.5 ML PEN: 1.5 | 28 days supply | Qty: 2 | Fill #1

## 2019-01-20 MED FILL — TRULICITY 1.5 MG/0.5 ML PEN: 1.5 | 28 days supply | Qty: 2 | Fill #2

## 2019-01-21 ENCOUNTER — Other Ambulatory Visit: Payer: Self-pay

## 2019-01-21 DIAGNOSIS — IMO0001 Reserved for inherently not codable concepts without codable children: Secondary | ICD-10-CM

## 2019-02-17 ENCOUNTER — Other Ambulatory Visit: Payer: Self-pay | Admitting: Family Medicine

## 2019-02-17 MED FILL — TRULICITY 1.5 MG/0.5 ML PEN: 1.5 | 28 days supply | Qty: 2 | Fill #0

## 2019-03-30 MED FILL — LANTUS SOLOSTAR 100 UNITS/M: 100 | 37 days supply | Qty: 15 | Fill #0

## 2019-04-01 ENCOUNTER — Other Ambulatory Visit: Payer: Self-pay

## 2019-04-01 ENCOUNTER — Ambulatory Visit (INDEPENDENT_AMBULATORY_CARE_PROVIDER_SITE_OTHER): Payer: No Typology Code available for payment source | Admitting: Family Medicine

## 2019-04-01 ENCOUNTER — Encounter: Payer: Self-pay | Admitting: Family Medicine

## 2019-04-01 VITALS — BP 136/84 | HR 58 | Temp 98.5°F | Resp 16

## 2019-04-01 DIAGNOSIS — Z794 Long term (current) use of insulin: Secondary | ICD-10-CM | POA: Diagnosis not present

## 2019-04-01 DIAGNOSIS — E782 Mixed hyperlipidemia: Secondary | ICD-10-CM

## 2019-04-01 DIAGNOSIS — E119 Type 2 diabetes mellitus without complications: Secondary | ICD-10-CM

## 2019-04-01 DIAGNOSIS — E039 Hypothyroidism, unspecified: Secondary | ICD-10-CM | POA: Diagnosis not present

## 2019-04-01 MED ORDER — CETIRIZINE HCL 10 MG PO TABS: 10.0000 mg | ORAL_TABLET | Freq: Every day | ORAL | 11 refills | Status: AC | PRN

## 2019-04-01 NOTE — Progress Notes (Signed)
   Subjective:    Patient ID: Grace Hawkins, female    DOB: 02-25-1976, 43 y.o.   MRN: [id number]  Patient presents for Establish Care (new provdier- is fasting)   Pt here to follow-up medications.  This my first time meeting with her therefore reviewed her history in detail.   Hypothyrodism since age 66   Diagnosed with  DM last year- A1C was  11.2% at that time  - she is currently  On Lantus 14   CBG were  80-90 has been trying to taper off of the insulin since starting the Trulicity.  Trulicity-1.5mg  once a week  Metformin 1000mg  BID, occ     She is not on statin drug and or ACEI  She was on ASA 81mg       DM- [facility]       Dr. Gabrielle Dare OB/GYN    She works for [employer] in [place], has 3rd shift    Due for repeaat TSH, 0.05 in March     TDAP 2018    Review Of Systems:  GEN- denies fatigue, fever, weight loss,weakness, recent illness HEENT- denies eye drainage, change in vision, nasal discharge, CVS- denies chest pain, palpitations RESP- denies SOB, cough, wheeze ABD- denies N/V, change in stools, abd pain GU- denies dysuria, hematuria, dribbling, incontinence MSK- denies joint pain, muscle aches, injury Neuro- denies headache, dizziness, syncope, seizure activity       Objective:    BP 136/84   Pulse (!) 58   Temp 98.5 F (36.9 C) (Oral)   Resp 16   SpO2 98%  GEN- NAD, alert and oriented x3 obesity she declined weight but states at home that she is around 330 pounds HEENT- PERRL, EOMI, non injected sclera, pink conjunctiva, MMM, oropharynx clear Neck- Supple, no thyromegaly CVS- RRR, no murmur RESP-CTAB ABD-NABS,soft,NT,ND EXT- No edema Pulses- Radial, DP- 2+        Assessment & Plan:      Problem List Items Addressed This Visit      Unprioritized   Hyperlipidemia, mixed    Recheck her lipid panel goal is LDL less than 100      Relevant Orders   Lipid panel (Completed)   Hypothyroid    Hypothyroidism since a  child.  Her levels have been stable exception of when she started her weight loss they have been decreasing her dosage.      Relevant Orders   TSH (Completed)   T3, free (Completed)   T4, free (Completed)   Type 2 diabetes mellitus without complications ([place]) - Primary    Patient is morbidly obese she would not weigh she is concerned about her coworkers knowing her weight.  He is however making diligent changes to get her weight down.  I think that she would be able to come off of the Lantus and continue with Trulicity and oral medications.  We will recheck her A1c goal is less than 7%.  We will plan to start her on lisinopril 2.5 mg for renal protection.      Relevant Orders   CBC with Differential/Platelet (Completed)   Comprehensive metabolic panel (Completed)   Hemoglobin A1c (Completed)   Microalbumin / creatinine urine ratio (Completed)   HM DIABETES FOOT EXAM (Completed)      Note: This dictation was prepared with Dragon dictation along with smaller phrase technology. Any transcriptional errors that result from this process are unintentional.

## 2019-04-01 NOTE — Patient Instructions (Addendum)
I will plan to add lisinopril 2.5mg  once a day for renal protection We will call with lab results  Schedule your GYN physical  F/U 4 months for physical

## 2019-04-02 LAB — CBC WITH DIFFERENTIAL/PLATELET
Absolute Monocytes: 567 cells/uL (ref 200–950)
Basophils Absolute: 56 cells/uL (ref 0–200)
Basophils Relative: 0.6 %
Eosinophils Absolute: 372 cells/uL (ref 15–500)
Eosinophils Relative: 4 %
HCT: 38.1 % (ref 35.0–45.0)
Hemoglobin: 12.6 g/dL (ref 11.7–15.5)
Lymphs Abs: 2148 cells/uL (ref 850–3900)
MCH: 26.3 pg — ABNORMAL LOW (ref 27.0–33.0)
MCHC: 33.1 g/dL (ref 32.0–36.0)
MCV: 79.4 fL — ABNORMAL LOW (ref 80.0–100.0)
MPV: 11.2 fL (ref 7.5–12.5)
Monocytes Relative: 6.1 %
Neutro Abs: 6157 cells/uL (ref 1500–7800)
Neutrophils Relative %: 66.2 %
Platelets: 313 10*3/uL (ref 140–400)
RBC: 4.8 10*6/uL (ref 3.80–5.10)
RDW: 14.9 % (ref 11.0–15.0)
Total Lymphocyte: 23.1 %
WBC: 9.3 10*3/uL (ref 3.8–10.8)

## 2019-04-02 LAB — COMPREHENSIVE METABOLIC PANEL
AG Ratio: 1.2 (calc) (ref 1.0–2.5)
ALT: 18 U/L (ref 6–29)
AST: 15 U/L (ref 10–30)
Albumin: 3.7 g/dL (ref 3.6–5.1)
Alkaline phosphatase (APISO): 52 U/L (ref 31–125)
BUN: 9 mg/dL (ref 7–25)
CO2: 24 mmol/L (ref 20–32)
Calcium: 9.1 mg/dL (ref 8.6–10.2)
Chloride: 103 mmol/L (ref 98–110)
Creat: 0.71 mg/dL (ref 0.50–1.10)
Globulin: 3 g/dL (calc) (ref 1.9–3.7)
Glucose, Bld: 81 mg/dL (ref 65–99)
Potassium: 3.8 mmol/L (ref 3.5–5.3)
Sodium: 138 mmol/L (ref 135–146)
Total Bilirubin: 0.4 mg/dL (ref 0.2–1.2)
Total Protein: 6.7 g/dL (ref 6.1–8.1)

## 2019-04-02 LAB — MICROALBUMIN / CREATININE URINE RATIO
Creatinine, Urine: 128 mg/dL (ref 20–275)
Microalb Creat Ratio: 6 mcg/mg creat (ref <30)
Microalb, Ur: 0.8 mg/dL

## 2019-04-02 LAB — TSH: TSH: 0.02 mIU/L — ABNORMAL LOW

## 2019-04-02 LAB — T3, FREE: T3, Free: 4.1 pg/mL (ref 2.3–4.2)

## 2019-04-02 LAB — LIPID PANEL
Cholesterol: 168 mg/dL (ref <200)
HDL: 41 mg/dL — ABNORMAL LOW (ref >=50)
LDL Cholesterol (Calc): 99 mg/dL (calc)
Non-HDL Cholesterol (Calc): 127 mg/dL (calc) (ref <130)
Total CHOL/HDL Ratio: 4.1 (calc) (ref <5.0)
Triglycerides: 187 mg/dL — ABNORMAL HIGH (ref <150)

## 2019-04-02 LAB — HEMOGLOBIN A1C
Hgb A1c MFr Bld: 5.1 % of total Hgb (ref <5.7)
Mean Plasma Glucose: 100 (calc)
eAG (mmol/L): 5.5 (calc)

## 2019-04-02 LAB — T4, FREE: Free T4: 2.1 ng/dL — ABNORMAL HIGH (ref 0.8–1.8)

## 2019-04-04 ENCOUNTER — Other Ambulatory Visit: Payer: Self-pay | Admitting: *Deleted

## 2019-04-04 ENCOUNTER — Encounter: Payer: Self-pay | Admitting: Family Medicine

## 2019-04-04 DIAGNOSIS — E669 Obesity, unspecified: Secondary | ICD-10-CM | POA: Insufficient documentation

## 2019-04-04 DIAGNOSIS — E039 Hypothyroidism, unspecified: Secondary | ICD-10-CM

## 2019-04-04 MED ORDER — SYNTHROID 137 MCG PO TABS: 274.0000 ug | ORAL_TABLET | Freq: Every day | ORAL | 0 refills | Status: DC

## 2019-04-04 MED FILL — SYNTHROID 137 MCG TABLET: 137 | 30 days supply | Qty: 60 | Fill #0

## 2019-04-04 NOTE — Assessment & Plan Note (Signed)
Hypothyroidism since a child.  Her levels have been stable exception of when she started her weight loss they have been decreasing her dosage.

## 2019-04-04 NOTE — Assessment & Plan Note (Signed)
Patient is morbidly obese she would not weigh she is concerned about her coworkers knowing her weight.  He is however making diligent changes to get her weight down.  I think that she would be able to come off of the Lantus and continue with Trulicity and oral medications.  We will recheck her A1c goal is less than 7%.  We will plan to start her on lisinopril 2.5 mg for renal protection.

## 2019-04-04 NOTE — Assessment & Plan Note (Signed)
Recheck her lipid panel goal is LDL less than 100

## 2019-04-05 MED ORDER — TRULICITY 1.5 MG/0.5ML ~~LOC~~ SOAJ: SUBCUTANEOUS | 2 refills | Status: DC

## 2019-04-05 MED ORDER — SYNTHROID 137 MCG PO TABS: 274.0000 ug | ORAL_TABLET | Freq: Every day | ORAL | 0 refills | Status: DC

## 2019-04-05 MED FILL — TRULICITY 1.5 MG/0.5 ML PEN: 1.5 | 84 days supply | Qty: 6 | Fill #0

## 2019-04-12 MED FILL — ACCU-CHEK FASTCLIX LANCETS: 34 days supply | Qty: 102 | Fill #0

## 2019-04-12 MED FILL — ACCU-CHEK GUIDE TEST STRIP: 33 days supply | Qty: 100 | Fill #0

## 2019-05-17 MED FILL — SYNTHROID 137 MCG TABLET: 137 | 90 days supply | Qty: 180 | Fill #0

## 2019-06-07 MED FILL — metFORMIN HCL 1000 MG TABS: 1000 | 90 days supply | Qty: 180 | Fill #1

## 2019-06-20 MED FILL — TRULICITY 1.5 MG/0.5 ML PEN: 1.5 | 84 days supply | Qty: 6 | Fill #1

## 2019-07-04 MED FILL — FREESTYLE LITE METER: 30 days supply | Qty: 1 | Fill #0

## 2019-07-04 MED FILL — FREESTYLE LANCETS: 90 days supply | Qty: 300 | Fill #0

## 2019-07-04 MED FILL — FREESTYLE LITE TEST STRIP: 90 days supply | Qty: 300 | Fill #0

## 2019-08-04 ENCOUNTER — Other Ambulatory Visit: Payer: Self-pay | Admitting: *Deleted

## 2019-08-04 ENCOUNTER — Telehealth: Payer: Self-pay | Admitting: Family Medicine

## 2019-08-04 DIAGNOSIS — Z794 Long term (current) use of insulin: Secondary | ICD-10-CM

## 2019-08-04 DIAGNOSIS — E119 Type 2 diabetes mellitus without complications: Secondary | ICD-10-CM

## 2019-08-04 DIAGNOSIS — E782 Mixed hyperlipidemia: Secondary | ICD-10-CM

## 2019-08-04 DIAGNOSIS — E669 Obesity, unspecified: Secondary | ICD-10-CM

## 2019-08-04 DIAGNOSIS — E039 Hypothyroidism, unspecified: Secondary | ICD-10-CM

## 2019-08-04 NOTE — Telephone Encounter (Signed)
Patient would like orders put in for a1c and for a thyroid check if possible  Would like a call back at [phone]

## 2019-08-04 NOTE — Telephone Encounter (Signed)
Patient due for physical and fasting labs.   Call placed to patient. No answer. No VM.

## 2019-08-08 NOTE — Telephone Encounter (Signed)
Multiple calls placed to patient with no answer and no return call.   Message to be closed.  

## 2019-08-08 NOTE — Telephone Encounter (Signed)
Call placed to patient. No answer. No VM.  

## 2019-08-09 MED FILL — AZITHROMYCIN 250 MG TABLET: 250 | 5 days supply | Qty: 6 | Fill #0

## 2019-08-12 ENCOUNTER — Ambulatory Visit (INDEPENDENT_AMBULATORY_CARE_PROVIDER_SITE_OTHER): Payer: No Typology Code available for payment source | Admitting: Family Medicine

## 2019-08-12 ENCOUNTER — Encounter: Payer: Self-pay | Admitting: Family Medicine

## 2019-08-12 ENCOUNTER — Other Ambulatory Visit: Payer: Self-pay

## 2019-08-12 VITALS — BP 126/72 | HR 93 | Temp 98.7°F | Resp 14

## 2019-08-12 DIAGNOSIS — H04203 Unspecified epiphora, bilateral lacrimal glands: Secondary | ICD-10-CM

## 2019-08-12 DIAGNOSIS — E119 Type 2 diabetes mellitus without complications: Secondary | ICD-10-CM

## 2019-08-12 DIAGNOSIS — E039 Hypothyroidism, unspecified: Secondary | ICD-10-CM

## 2019-08-12 DIAGNOSIS — E669 Obesity, unspecified: Secondary | ICD-10-CM

## 2019-08-12 DIAGNOSIS — H5789 Other specified disorders of eye and adnexa: Secondary | ICD-10-CM

## 2019-08-12 DIAGNOSIS — E782 Mixed hyperlipidemia: Secondary | ICD-10-CM

## 2019-08-12 DIAGNOSIS — Z794 Long term (current) use of insulin: Secondary | ICD-10-CM

## 2019-08-12 MED ORDER — OLOPATADINE HCL 0.2 % OP SOLN: 1.0000 [drp] | Freq: Every day | OPHTHALMIC | 1 refills | Status: DC | PRN

## 2019-08-12 MED FILL — OLOPATADINE HCL 0.2 % SOLN: 0.2 | 30 days supply | Qty: 3 | Fill #0

## 2019-08-12 NOTE — Progress Notes (Signed)
Subjective:    Patient ID: Grace Hawkins, female    DOB: September 08, 1975, 44 y.o.   MRN: [id number]  Patient presents for Follow-up (is fsating)  Patient here to follow-up chronic medical problems.  I establish care with her back in October 2020.  Here to follow-up diabetes mellitus and  her medications.  Diabetes mellitus her last A1c was 5.1.  I had her stop the Lantus and continue with Trulicity and Metformin due to her obesity.  typicallty gets metformin in once a day as she doesn't typically eat breakfast   CBG fasting readings 90-110   Hypothyroidism she is taking levothyroxine 275 mcg once a day.  Due for repeat TSH this dose was reduced  Lipid panel showed triglycerides elevated at 187 but LDL at 99, no current statin drug  Bilat eye watery drainage on and off, often gets so irritated she has a crack at the corner of the right eye laterally and sometimes medially on the left 1.  Denies any redness to the sclera.  Denies any eye pain.  She did call her eye doctor they sent her in a Z-Pak which she is currently taking but she has not seen any improvement.  In the past she has been on antibiotic eyedrops with minimal improvement.  She is also tried Systane lubricating drops which did not help.  Unfortunately her eye doctor could not get her in until June      Review Of Systems:  GEN- denies fatigue, fever, weight loss,weakness, recent illness HEENT- denies eye drainage, change in vision, nasal discharge, CVS- denies chest pain, palpitations RESP- denies SOB, cough, wheeze ABD- denies N/V, change in stools, abd pain GU- denies dysuria, hematuria, dribbling, incontinence MSK- denies joint pain, muscle aches, injury Neuro- denies headache, dizziness, syncope, seizure activity       Objective:    BP 126/72   Pulse 93   Temp 98.7 F (37.1 C) (Temporal)   Resp 14   LMP 08/01/2019 Comment: regular  SpO2 99%  GEN- NAD, alert and oriented x3, pt declines weighing HEENT- PERRL,  EOMI, non injected sclera, pink conjunctiva,eczematous rash at corner of right eye  Neck- Supple, no thyromegaly CVS- RRR, no murmur RESP-CTAB EXT- No edema Pulses- Radial, DP- 2+        Assessment & Plan:      Problem List Items Addressed This Visit      Unprioritized   Hyperlipidemia, mixed    She declines statin therapy at this time She has improved A1C, bp also not an issue Will hold on ACEI as well       Hypothyroid - Primary    Recheck TSH today With decreasing weight, will continue to make adjustments       Obesity   Type 2 diabetes mellitus without complications (H[facility])    No longer requiring insulin therapy Continue glp 1and metformin due to insulin resistance and weight      Relevant Orders   Ambulatory referral to Ophthalmology    Other Visit Diagnoses    Eye irritation       can try 1% cortisone at corner, she has also been using vaseline, will try PATADAY in case allergy related, she does take zyrtec    Relevant Orders   Ambulatory referral to Ophthalmology   Watery eyes       Relevant Orders   Ambulatory referral to Ophthalmology      Note: This dictation was prepared with Dragon dictation along with smaller phrase technology.  Any transcriptional errors that result from this process are unintentional.

## 2019-08-12 NOTE — Assessment & Plan Note (Signed)
No longer requiring insulin therapy Continue glp 1and metformin due to insulin resistance and weight

## 2019-08-12 NOTE — Patient Instructions (Signed)
F/U 4 months  We will send results via mychart  Try the pataday Referral to ophthalmology

## 2019-08-12 NOTE — Assessment & Plan Note (Signed)
Recheck TSH today With decreasing weight, will continue to make adjustments

## 2019-08-12 NOTE — Assessment & Plan Note (Signed)
She declines statin therapy at this time She has improved A1C, bp also not an issue Will hold on ACEI as well

## 2019-08-13 LAB — CBC WITH DIFFERENTIAL/PLATELET
Absolute Monocytes: 543 cells/uL (ref 200–950)
Basophils Absolute: 57 cells/uL (ref 0–200)
Basophils Relative: 0.7 %
Eosinophils Absolute: 381 cells/uL (ref 15–500)
Eosinophils Relative: 4.7 %
HCT: 38.8 % (ref 35.0–45.0)
Hemoglobin: 12.5 g/dL (ref 11.7–15.5)
Lymphs Abs: 1620 cells/uL (ref 850–3900)
MCH: 26.5 pg — ABNORMAL LOW (ref 27.0–33.0)
MCHC: 32.2 g/dL (ref 32.0–36.0)
MCV: 82.2 fL (ref 80.0–100.0)
MPV: 11.1 fL (ref 7.5–12.5)
Monocytes Relative: 6.7 %
Neutro Abs: 5500 cells/uL (ref 1500–7800)
Neutrophils Relative %: 67.9 %
Platelets: 281 10*3/uL (ref 140–400)
RBC: 4.72 10*6/uL (ref 3.80–5.10)
RDW: 14.3 % (ref 11.0–15.0)
Total Lymphocyte: 20 %
WBC: 8.1 10*3/uL (ref 3.8–10.8)

## 2019-08-13 LAB — COMPLETE METABOLIC PANEL WITH GFR
AG Ratio: 1.2 (calc) (ref 1.0–2.5)
ALT: 17 U/L (ref 6–29)
AST: 15 U/L (ref 10–30)
Albumin: 3.8 g/dL (ref 3.6–5.1)
Alkaline phosphatase (APISO): 53 U/L (ref 31–125)
BUN: 12 mg/dL (ref 7–25)
CO2: 25 mmol/L (ref 20–32)
Calcium: 9.1 mg/dL (ref 8.6–10.2)
Chloride: 105 mmol/L (ref 98–110)
Creat: 0.78 mg/dL (ref 0.50–1.10)
GFR, Est African American: 109 mL/min/{1.73_m2} (ref >=60)
GFR, Est Non African American: 94 mL/min/{1.73_m2} (ref >=60)
Globulin: 3.1 g/dL (calc) (ref 1.9–3.7)
Glucose, Bld: 97 mg/dL (ref 65–99)
Potassium: 4.2 mmol/L (ref 3.5–5.3)
Sodium: 140 mmol/L (ref 135–146)
Total Bilirubin: 0.4 mg/dL (ref 0.2–1.2)
Total Protein: 6.9 g/dL (ref 6.1–8.1)

## 2019-08-13 LAB — LIPID PANEL
Cholesterol: 161 mg/dL (ref <200)
HDL: 43 mg/dL — ABNORMAL LOW (ref >=50)
LDL Cholesterol (Calc): 90 mg/dL (calc)
Non-HDL Cholesterol (Calc): 118 mg/dL (calc) (ref <130)
Total CHOL/HDL Ratio: 3.7 (calc) (ref <5.0)
Triglycerides: 183 mg/dL — ABNORMAL HIGH (ref <150)

## 2019-08-13 LAB — HEMOGLOBIN A1C
Hgb A1c MFr Bld: 5.2 % of total Hgb (ref <5.7)
Mean Plasma Glucose: 103 (calc)
eAG (mmol/L): 5.7 (calc)

## 2019-08-13 LAB — MICROALBUMIN / CREATININE URINE RATIO
Creatinine, Urine: 148 mg/dL (ref 20–275)
Microalb Creat Ratio: 7 mcg/mg creat (ref <30)
Microalb, Ur: 1.1 mg/dL

## 2019-08-13 LAB — TSH: TSH: 0.03 mIU/L — ABNORMAL LOW

## 2019-08-19 ENCOUNTER — Encounter: Payer: Self-pay | Admitting: *Deleted

## 2019-08-19 DIAGNOSIS — E039 Hypothyroidism, unspecified: Secondary | ICD-10-CM

## 2019-08-19 MED ORDER — SYNTHROID 137 MCG PO TABS: 137.0000 ug | ORAL_TABLET | Freq: Every day | ORAL | 1 refills | Status: DC

## 2019-08-19 MED ORDER — SYNTHROID 112 MCG PO TABS: 112.0000 ug | ORAL_TABLET | Freq: Every day | ORAL | 1 refills | Status: DC

## 2019-08-19 MED FILL — SYNTHROID 137 MCG TABLET: 137 | 30 days supply | Qty: 30 | Fill #0

## 2019-08-19 MED FILL — SYNTHROID 112 MCG TABLET: 112 | 30 days supply | Qty: 30 | Fill #0

## 2019-08-21 ENCOUNTER — Encounter: Payer: Self-pay | Admitting: Family Medicine

## 2019-08-22 MED ORDER — LEVOTHYROXINE SODIUM 125 MCG PO TABS: 250.0000 ug | ORAL_TABLET | Freq: Every day | ORAL | 3 refills | Status: DC

## 2019-08-22 MED FILL — SYNTHROID 125 MCG TABLET: 125 | 90 days supply | Qty: 180 | Fill #0

## 2019-08-30 MED FILL — metFORMIN HCL 1000 MG TABS: 1000 | 90 days supply | Qty: 180 | Fill #2

## 2019-09-15 MED FILL — TRULICITY 1.5 MG/0.5 ML PEN: 1.5 | 84 days supply | Qty: 6 | Fill #2

## 2019-09-29 ENCOUNTER — Encounter: Payer: Self-pay | Admitting: Family Medicine

## 2019-10-03 MED FILL — OLOPATADINE HCL 0.2 % SOLN: 0.2 | 30 days supply | Qty: 3 | Fill #1

## 2019-10-14 ENCOUNTER — Other Ambulatory Visit: Payer: Self-pay

## 2019-10-14 ENCOUNTER — Other Ambulatory Visit: Payer: No Typology Code available for payment source

## 2019-10-14 DIAGNOSIS — E039 Hypothyroidism, unspecified: Secondary | ICD-10-CM

## 2019-10-14 LAB — TSH: TSH: 0.15 mIU/L — ABNORMAL LOW

## 2019-10-20 ENCOUNTER — Other Ambulatory Visit: Payer: Self-pay | Admitting: *Deleted

## 2019-10-20 DIAGNOSIS — E039 Hypothyroidism, unspecified: Secondary | ICD-10-CM

## 2019-10-20 MED ORDER — LEVOTHYROXINE SODIUM 200 MCG PO TABS: 200.0000 ug | ORAL_TABLET | Freq: Every day | ORAL | 1 refills | Status: DC

## 2019-10-20 MED FILL — SYNTHROID 200 MCG TABLET: 200 | 90 days supply | Qty: 90 | Fill #0

## 2019-11-21 ENCOUNTER — Other Ambulatory Visit: Payer: Self-pay | Admitting: Family Medicine

## 2019-11-22 MED FILL — MELOXICAM 15 MG TABLET: 15 | 60 days supply | Qty: 60 | Fill #0

## 2019-11-29 ENCOUNTER — Other Ambulatory Visit: Payer: Self-pay | Admitting: Family Medicine

## 2019-12-08 LAB — HM DIABETES EYE EXAM

## 2019-12-12 ENCOUNTER — Ambulatory Visit (INDEPENDENT_AMBULATORY_CARE_PROVIDER_SITE_OTHER): Payer: No Typology Code available for payment source | Admitting: Family Medicine

## 2019-12-12 ENCOUNTER — Encounter: Payer: Self-pay | Admitting: Family Medicine

## 2019-12-12 ENCOUNTER — Other Ambulatory Visit: Payer: Self-pay

## 2019-12-12 VITALS — BP 126/74 | HR 80 | Temp 98.4°F | Resp 14

## 2019-12-12 DIAGNOSIS — E782 Mixed hyperlipidemia: Secondary | ICD-10-CM

## 2019-12-12 DIAGNOSIS — Z0001 Encounter for general adult medical examination with abnormal findings: Secondary | ICD-10-CM | POA: Diagnosis not present

## 2019-12-12 DIAGNOSIS — Z794 Long term (current) use of insulin: Secondary | ICD-10-CM

## 2019-12-12 DIAGNOSIS — E119 Type 2 diabetes mellitus without complications: Secondary | ICD-10-CM

## 2019-12-12 DIAGNOSIS — Z1159 Encounter for screening for other viral diseases: Secondary | ICD-10-CM

## 2019-12-12 DIAGNOSIS — E039 Hypothyroidism, unspecified: Secondary | ICD-10-CM | POA: Diagnosis not present

## 2019-12-12 DIAGNOSIS — Z0000 Encounter for general adult medical examination without abnormal findings: Secondary | ICD-10-CM

## 2019-12-12 MED ORDER — TRULICITY 1.5 MG/0.5ML ~~LOC~~ SOAJ: SUBCUTANEOUS | 2 refills | Status: DC

## 2019-12-12 MED ORDER — METFORMIN HCL 1000 MG PO TABS: 1000.0000 mg | ORAL_TABLET | Freq: Two times a day (BID) | ORAL | 2 refills | Status: DC

## 2019-12-12 MED FILL — METFORMIN HCL 1000 MG TABS: 1000 | 90 days supply | Qty: 180 | Fill #0

## 2019-12-12 MED FILL — TRULICITY 1.5 MG/0.5 ML PEN: 1.5 | 84 days supply | Qty: 6 | Fill #0

## 2019-12-12 NOTE — Assessment & Plan Note (Signed)
Recheck thyroid function studies.  Continue levothyroxine supplementation.

## 2019-12-12 NOTE — Assessment & Plan Note (Signed)
Triglycerides elevated in February we will recheck today.

## 2019-12-12 NOTE — Progress Notes (Signed)
   Subjective:    Patient ID: Grace Hawkins, female    DOB: January 02, 1976, 44 y.o.   MRN: [id number]  Patient presents for Annual Exam (is fasting) Patient here to follow-up chronic medical problems and for CPE .  Medications reviewed.  DM- taking metformin 1000mg  BID and trulicity , last A1c 5.2% declines statin drug  Does weigh herself at home.  States that she was up 6 pounds but recently lost height 6 pounds due to decreasing her carbs.  Hypothyroidism-taking levothyroxine once a day due for recpech   Due for PAP /Mammogram-  Dr.  Immunizations- Flu UTD, due for TDAP/COVID 10 UTD   Defer PNA 23 today   Discussed HIV/HEP C screening    Eye allergies- using PAtaday , had eye exam Dr. Ambrose Mantle [facility]   Wears apple Watch- walks for physical activity   Seen at [facility]- had injury to right foot , had a tendinitis, taking prn mobic , using inserts in shoes       Review Of Systems:  GEN- denies fatigue, fever, weight loss,weakness, recent illness HEENT- denies eye drainage, change in vision, nasal discharge, CVS- denies chest pain, palpitations RESP- denies SOB, cough, wheeze ABD- denies N/V, change in stools, abd pain GU- denies dysuria, hematuria, dribbling, incontinence MSK- denies joint pain, muscle aches, injury Neuro- denies headache, dizziness, syncope, seizure activity       Objective:    BP 126/74   Pulse 80   Temp 98.4 F (36.9 C) (Temporal)   Resp 14   LMP 12/02/2019 Comment: regular  SpO2 99%  GEN- NAD, alert and oriented x3, obese, pt declines weight  HEENT- PERRL, EOMI, non injected sclera, pink conjunctiva, MMM, oropharynx clear Neck- Supple, no thyromegaly CVS- RRR, no murmur RESP-CTAB ABD-NABS,soft,NT,ND Psych- normal affect and mood EXT- No edema Pulses- Radial, DP- 2+        Assessment & Plan:     FALL/CAGE/Depression screen neg  Problem List Items Addressed This Visit      Unprioritized    Hyperlipidemia, mixed    Triglycerides elevated in February we will recheck today.      Hypothyroid    Recheck thyroid function studies.  Continue levothyroxine supplementation.      Relevant Orders   TSH   T3, free   Type 2 diabetes mellitus without complications (HCC)    Continue with Metformin as well as GLP-1 therapy to help with class III obesity.  She declines weighing in the office.  She does weigh at home.  States that she has lost 50 pounds of the past couple years.  She declines any other intervention at this time we did discuss the healthy weight and wellness center/dietitian Continue with her low-carb diet.  Obtain eye exam       Relevant Orders   Hemoglobin A1c   Lipid panel    Other Visit Diagnoses    Routine general medical examination at a health care facility    -  Primary   CPE done, pt to schedule with GYN for mammo and PAP Smear   Relevant Orders   CBC with Differential/Platelet   Comprehensive metabolic panel   Need for hepatitis C screening test       Relevant Orders   Hepatitis C antibody      Note: This dictation was prepared with Dragon dictation along with smaller phrase technology. Any transcriptional errors that result from this process are unintentional.

## 2019-12-12 NOTE — Assessment & Plan Note (Addendum)
Continue with Metformin as well as GLP-1 therapy to help with class III obesity.  She declines weighing in the office.  She does weigh at home.  States that she has lost 50 pounds of the past couple years.  She declines any other intervention at this time we did discuss the healthy weight and wellness center/dietitian Continue with her low-carb diet.  Obtain eye exam

## 2019-12-12 NOTE — Patient Instructions (Signed)
F/U 4 months  Schedule with your GYN for PAP and Mammogram We will send lab results via mychart

## 2019-12-12 NOTE — Addendum Note (Signed)
Addended by: [place], Matayah Reyburn F on: 12/12/2019 12:22 PM   Modules accepted: Level of Service  

## 2019-12-13 LAB — CBC WITH DIFFERENTIAL/PLATELET
Absolute Monocytes: 529 {cells}/uL (ref 200–950)
Basophils Absolute: 47 {cells}/uL (ref 0–200)
Basophils Relative: 0.6 %
Eosinophils Absolute: 269 {cells}/uL (ref 15–500)
Eosinophils Relative: 3.4 %
HCT: 38.8 % (ref 35.0–45.0)
Hemoglobin: 12.5 g/dL (ref 11.7–15.5)
Lymphs Abs: 1801 {cells}/uL (ref 850–3900)
MCH: 26.7 pg — ABNORMAL LOW (ref 27.0–33.0)
MCHC: 32.2 g/dL (ref 32.0–36.0)
MCV: 82.9 fL (ref 80.0–100.0)
MPV: 11.3 fL (ref 7.5–12.5)
Monocytes Relative: 6.7 %
Neutro Abs: 5254 {cells}/uL (ref 1500–7800)
Neutrophils Relative %: 66.5 %
Platelets: 284 Thousand/uL (ref 140–400)
RBC: 4.68 Million/uL (ref 3.80–5.10)
RDW: 14.6 % (ref 11.0–15.0)
Total Lymphocyte: 22.8 %
WBC: 7.9 Thousand/uL (ref 3.8–10.8)

## 2019-12-13 LAB — COMPREHENSIVE METABOLIC PANEL
AG Ratio: 1.3 (calc) (ref 1.0–2.5)
ALT: 17 U/L (ref 6–29)
AST: 13 U/L (ref 10–30)
Albumin: 3.9 g/dL (ref 3.6–5.1)
Alkaline phosphatase (APISO): 58 U/L (ref 31–125)
BUN: 13 mg/dL (ref 7–25)
CO2: 26 mmol/L (ref 20–32)
Calcium: 9.2 mg/dL (ref 8.6–10.2)
Chloride: 105 mmol/L (ref 98–110)
Creat: 0.83 mg/dL (ref 0.50–1.10)
Globulin: 2.9 g/dL (calc) (ref 1.9–3.7)
Glucose, Bld: 95 mg/dL (ref 65–99)
Potassium: 4.5 mmol/L (ref 3.5–5.3)
Sodium: 141 mmol/L (ref 135–146)
Total Bilirubin: 0.3 mg/dL (ref 0.2–1.2)
Total Protein: 6.8 g/dL (ref 6.1–8.1)

## 2019-12-13 LAB — HEMOGLOBIN A1C
Hgb A1c MFr Bld: 5.3 % of total Hgb (ref <5.7)
Mean Plasma Glucose: 105 (calc)
eAG (mmol/L): 5.8 (calc)

## 2019-12-13 LAB — LIPID PANEL
Cholesterol: 202 mg/dL — ABNORMAL HIGH
HDL: 47 mg/dL — ABNORMAL LOW
LDL Cholesterol (Calc): 118 mg/dL — ABNORMAL HIGH
Non-HDL Cholesterol (Calc): 155 mg/dL — ABNORMAL HIGH
Total CHOL/HDL Ratio: 4.3 (calc)
Triglycerides: 241 mg/dL — ABNORMAL HIGH

## 2019-12-13 LAB — T3, FREE: T3, Free: 2.7 pg/mL (ref 2.3–4.2)

## 2019-12-13 LAB — HEPATITIS C ANTIBODY
Hepatitis C Ab: NONREACTIVE
SIGNAL TO CUT-OFF: 0.03

## 2019-12-13 LAB — TSH: TSH: 1.98 m[IU]/L

## 2020-01-16 ENCOUNTER — Telehealth: Payer: Self-pay | Admitting: Family Medicine

## 2020-01-16 DIAGNOSIS — Z794 Long term (current) use of insulin: Secondary | ICD-10-CM

## 2020-01-16 NOTE — Telephone Encounter (Signed)
Referral orders placed

## 2020-01-16 NOTE — Telephone Encounter (Signed)
CB# [phone] Can Dr.Wanblee place a referral to see Dr.Cotter eye doctor due to insurance she will need a referral

## 2020-03-15 MED FILL — SYNTHROID 200 MCG TABLET: 200 | 90 days supply | Qty: 90 | Fill #1

## 2020-04-13 ENCOUNTER — Ambulatory Visit: Payer: No Typology Code available for payment source | Admitting: Family Medicine

## 2020-05-04 ENCOUNTER — Other Ambulatory Visit (HOSPITAL_COMMUNITY): Payer: Self-pay | Admitting: Family Medicine

## 2020-05-04 ENCOUNTER — Encounter: Payer: Self-pay | Admitting: Family Medicine

## 2020-05-04 ENCOUNTER — Other Ambulatory Visit: Payer: Self-pay

## 2020-05-04 ENCOUNTER — Ambulatory Visit (INDEPENDENT_AMBULATORY_CARE_PROVIDER_SITE_OTHER): Payer: No Typology Code available for payment source | Admitting: Family Medicine

## 2020-05-04 VITALS — BP 126/84 | HR 72 | Temp 97.8°F | Resp 14

## 2020-05-04 DIAGNOSIS — E782 Mixed hyperlipidemia: Secondary | ICD-10-CM | POA: Diagnosis not present

## 2020-05-04 DIAGNOSIS — M5126 Other intervertebral disc displacement, lumbar region: Secondary | ICD-10-CM

## 2020-05-04 DIAGNOSIS — E669 Obesity, unspecified: Secondary | ICD-10-CM

## 2020-05-04 DIAGNOSIS — E119 Type 2 diabetes mellitus without complications: Secondary | ICD-10-CM

## 2020-05-04 DIAGNOSIS — E039 Hypothyroidism, unspecified: Secondary | ICD-10-CM | POA: Diagnosis not present

## 2020-05-04 DIAGNOSIS — L309 Dermatitis, unspecified: Secondary | ICD-10-CM

## 2020-05-04 DIAGNOSIS — M5441 Lumbago with sciatica, right side: Secondary | ICD-10-CM

## 2020-05-04 DIAGNOSIS — M5136 Other intervertebral disc degeneration, lumbar region: Secondary | ICD-10-CM | POA: Insufficient documentation

## 2020-05-04 DIAGNOSIS — Z794 Long term (current) use of insulin: Secondary | ICD-10-CM

## 2020-05-04 MED ORDER — LANCETS ULTRA THIN 30G MISC
5 refills | Status: DC
Start: 2020-05-04 — End: 2021-01-11

## 2020-05-04 MED ORDER — CYCLOBENZAPRINE HCL 10 MG PO TABS
10.0000 mg | ORAL_TABLET | Freq: Three times a day (TID) | ORAL | 0 refills | Status: DC | PRN
Start: 2020-05-04 — End: 2020-09-07

## 2020-05-04 MED ORDER — TRULICITY 1.5 MG/0.5ML ~~LOC~~ SOAJ: SUBCUTANEOUS | 2 refills | Status: DC

## 2020-05-04 MED ORDER — HYDROCODONE-ACETAMINOPHEN 5-325 MG PO TABS: 1.0000 | ORAL_TABLET | Freq: Four times a day (QID) | ORAL | 0 refills | Status: DC | PRN

## 2020-05-04 MED ORDER — METFORMIN HCL 1000 MG PO TABS: 1000.0000 mg | ORAL_TABLET | Freq: Two times a day (BID) | ORAL | 2 refills | Status: DC

## 2020-05-04 MED ORDER — METHYLPREDNISOLONE 4 MG PO TBPK
ORAL_TABLET | ORAL | 0 refills | Status: DC
Start: 2020-05-04 — End: 2020-09-07

## 2020-05-04 MED ORDER — TRIAMCINOLONE ACETONIDE 0.1 % EX CREA
1.0000 | TOPICAL_CREAM | Freq: Two times a day (BID) | CUTANEOUS | 0 refills | Status: DC
Start: 2020-05-04 — End: 2020-09-07

## 2020-05-04 MED ORDER — GLUCOSE BLOOD VI STRP
ORAL_STRIP | 12 refills | Status: DC
Start: 2020-05-04 — End: 2020-05-04

## 2020-05-04 MED FILL — METHYLPREDNISOLONE 4 MG TBP: 4 | 21 days supply | Qty: 21 | Fill #0

## 2020-05-04 MED FILL — HYDROCODON-APAP 5-325: 5-325 | 3 days supply | Qty: 15 | Fill #0

## 2020-05-04 MED FILL — FREESTYLE LITE TEST STRIP: 33 days supply | Qty: 100 | Fill #0

## 2020-05-04 MED FILL — METFORMIN HCL 1000 MG TABS: 1000 | 90 days supply | Qty: 180 | Fill #0

## 2020-05-04 MED FILL — CYCLOBENZAPRINE HCL 10 MG T: 10 | 7 days supply | Qty: 20 | Fill #0

## 2020-05-04 MED FILL — TRIAMCINOLONE 0.1% CREAM: 0.1 | 15 days supply | Qty: 30 | Fill #0

## 2020-05-04 MED FILL — TRULICITY 1.5 MG/0.5 ML PEN: 1.5 | 84 days supply | Qty: 6 | Fill #0

## 2020-05-04 MED FILL — FREESTYLE LANCETS: 33 days supply | Qty: 100 | Fill #0

## 2020-05-04 NOTE — Assessment & Plan Note (Signed)
Known disc bulge with right nerve irritation on MRI 2014 No red flags Treat with medrol dosepak, flexeril, norco Will hold on ortho as it flares very rarely at this time

## 2020-05-04 NOTE — Patient Instructions (Signed)
F/U 4 months  We will call with lab results  

## 2020-05-04 NOTE — Assessment & Plan Note (Signed)
Recheck A1C Goal < 7% which she has been at Continue MTF and trulicity

## 2020-05-04 NOTE — Assessment & Plan Note (Addendum)
She will continue with weight watchers and low carb She is trying to move around some  [facility] has gone down in size in her pants.

## 2020-05-04 NOTE — Progress Notes (Signed)
Subjective:    Patient ID: Grace Hawkins, female    DOB: 1976/02/23, 44 y.o.   MRN: [id number]  Patient presents for Follow-up (is fasting) and R Sided Back Pain (radiating down R leg possible sciatica)   Pt here to f/u chronic medical problems Medications reviewed  Hypothyroidism- currently on levothyroxine once a day, last TSH in June at goal  DM- last A1C 5.3% in June, currently on metformin 1000mg  BID and trulicity 1.5mg  once a week She checks CBG randomly 90-100 Hyperlipidemia-  no current medications,last LDL 118  TG 241  She does feel like the Trulicity keeps her cravings down.  She states that she was down 20 pounds but then she regained it when she went on vacation.  She is now back on weight watchers and has lost 6 pounds in the past week. Pt declines weighing at visits   My eye doctor had recent visit     History of back pain states that she has had this since her last time was warm.  She had MRI back in 2014 which did show annular disc bulge with some right nerve root irritation.  States that she often gets on the right side when it flares but she has not had a flare of her back pain in over a year.  Take for 2 weeks and will get that once a year we will treat with medications and then she would be fine.  Does not recall any particular injury that may have caused her back to flare this time however she was sitting up for long period of time for work recently she thinks that that may have contributed.  She has pain in her lower back and radiates down the right buttocks.  No tingling or numbness in the foot.  She has tried over-the-counter medicines that has not helped. No change in iurination or bowels    Also has a rash that is on her left lower leg that itches.  Is also not scaly.  She has been trying hydrocortisone but it has not completely cleared up.  She has not had any pain no drainage    Review Of Systems:  GEN- denies fatigue, fever, weight loss,weakness,  recent illness HEENT- denies eye drainage, change in vision, nasal discharge, CVS- denies chest pain, palpitations RESP- denies SOB, cough, wheeze ABD- denies N/V, change in stools, abd pain GU- denies dysuria, hematuria, dribbling, incontinence MSK- + joint pain, muscle aches, injury Neuro- denies headache, dizziness, syncope, seizure activity       Objective:    BP 126/84   Pulse 72   Temp 97.8 F (36.6 C) (Temporal)   Resp 14   SpO2 100%  GEN- NAD, alert and oriented x3 HEENT- PERRL, EOMI, non injected sclera, pink conjunctiva, MMM, oropharynx clear Neck- Supple, no thyromegaly CVS- RRR, no murmur RESP-CTAB ABD-NABS,soft,NT,ND MSK- TTP Lumbar spine, +SLR right side, fair ROM spine, hips Neuro strength in tact LE, sensation in tact, mild antalgic gait  Skin left leg small eczematous path, with dry scaley skin , NT  EXT- No edema Pulses- Radial, DP- 2+        Assessment & Plan:      Problem List Items Addressed This Visit      Unprioritized   Bulging lumbar disc    Known disc bulge with right nerve irritation on MRI 2014 No red flags Treat with medrol dosepak, flexeril, norco Will hold on ortho as it flares very rarely at this time  Class 3 obesity    She will continue with weight watchers and low carb She is trying to move around some  SHe has gone down in size in her pants.      Hyperlipidemia, mixed   Relevant Orders   Lipid panel   Hypothyroid - Primary   Relevant Orders   TSH   T3, free   T4, free   Type 2 diabetes mellitus without complications (HCC)    Recheck A1C Goal < 7% which she has been at Continue MTF and trulicity      Relevant Medications   Dulaglutide (TRULICITY) 1.5 MG/0.5ML SOPN   metFORMIN (GLUCOPHAGE) 1000 MG tablet   Other Relevant Orders   CBC with Differential/Platelet   Comprehensive metabolic panel   Hemoglobin A1c    Other Visit Diagnoses    Acute right-sided back pain with sciatica       Relevant  Medications   methylPREDNISolone (MEDROL DOSEPAK) 4 MG TBPK tablet   cyclobenzaprine (FLEXERIL) 10 MG tablet   HYDROcodone-acetaminophen (NORCO) 5-325 MG tablet   Dermatitis Left leg        Triamcinolone cream BID prn       Note: This dictation was prepared with Dragon dictation along with smaller phrase technology. Any transcriptional errors that result from this process are unintentional.

## 2020-05-05 LAB — T4, FREE: Free T4: 1.1 ng/dL (ref 0.8–1.8)

## 2020-05-05 LAB — CBC WITH DIFFERENTIAL/PLATELET
Absolute Monocytes: 621 cells/uL (ref 200–950)
Basophils Absolute: 68 cells/uL (ref 0–200)
Basophils Relative: 0.8 %
Eosinophils Absolute: 111 cells/uL (ref 15–500)
Eosinophils Relative: 1.3 %
HCT: 41.5 % (ref 35.0–45.0)
Hemoglobin: 13.3 g/dL (ref 11.7–15.5)
Lymphs Abs: 2950 cells/uL (ref 850–3900)
MCH: 27.7 pg (ref 27.0–33.0)
MCHC: 32 g/dL (ref 32.0–36.0)
MCV: 86.3 fL (ref 80.0–100.0)
MPV: 11.7 fL (ref 7.5–12.5)
Monocytes Relative: 7.3 %
Neutro Abs: 4752 cells/uL (ref 1500–7800)
Neutrophils Relative %: 55.9 %
Platelets: 272 10*3/uL (ref 140–400)
RBC: 4.81 10*6/uL (ref 3.80–5.10)
RDW: 13.9 % (ref 11.0–15.0)
Total Lymphocyte: 34.7 %
WBC: 8.5 10*3/uL (ref 3.8–10.8)

## 2020-05-05 LAB — COMPREHENSIVE METABOLIC PANEL
AG Ratio: 1.3 (calc) (ref 1.0–2.5)
ALT: 11 U/L (ref 6–29)
AST: 13 U/L (ref 10–30)
Albumin: 3.8 g/dL (ref 3.6–5.1)
Alkaline phosphatase (APISO): 43 U/L (ref 31–125)
BUN: 23 mg/dL (ref 7–25)
CO2: 23 mmol/L (ref 20–32)
Calcium: 9.2 mg/dL (ref 8.6–10.2)
Chloride: 105 mmol/L (ref 98–110)
Creat: 1.01 mg/dL (ref 0.50–1.10)
Globulin: 3 g/dL (calc) (ref 1.9–3.7)
Glucose, Bld: 89 mg/dL (ref 65–99)
Potassium: 3.6 mmol/L (ref 3.5–5.3)
Sodium: 139 mmol/L (ref 135–146)
Total Bilirubin: 0.4 mg/dL (ref 0.2–1.2)
Total Protein: 6.8 g/dL (ref 6.1–8.1)

## 2020-05-05 LAB — LIPID PANEL
Cholesterol: 184 mg/dL (ref <200)
HDL: 41 mg/dL — ABNORMAL LOW (ref >=50)
LDL Cholesterol (Calc): 110 mg/dL (calc) — ABNORMAL HIGH
Non-HDL Cholesterol (Calc): 143 mg/dL (calc) — ABNORMAL HIGH (ref <130)
Total CHOL/HDL Ratio: 4.5 (calc) (ref <5.0)
Triglycerides: 221 mg/dL — ABNORMAL HIGH (ref <150)

## 2020-05-05 LAB — T3, FREE: T3, Free: 1.9 pg/mL — ABNORMAL LOW (ref 2.3–4.2)

## 2020-05-05 LAB — HEMOGLOBIN A1C
Hgb A1c MFr Bld: 5 % of total Hgb (ref <5.7)
Mean Plasma Glucose: 97 (calc)
eAG (mmol/L): 5.4 (calc)

## 2020-05-05 LAB — TSH: TSH: 31.37 mIU/L — ABNORMAL HIGH

## 2020-05-07 ENCOUNTER — Telehealth: Payer: Self-pay

## 2020-05-07 NOTE — Telephone Encounter (Signed)
I am not sure why her thyroid medication is so expensive, may need to call the pharmacy The next dose is  

## 2020-05-07 NOTE — Telephone Encounter (Signed)
I have called pt and informed her of the message and she stated that she had seen it on My-chart. Pt is requesting another medication instead of taking 2 different doses of the same medication due to the cost. She has to pay $75 per month for the TSH medication already.  Please advise and see if we can send her one medication verse paying extra with 2 different doses.

## 2020-05-07 NOTE — Telephone Encounter (Signed)
-----   Message from Salley Scarlet, MD sent at 05/07/2020  7:54 AM EST ----- Call pt her cholesterol has improved, A1C is normal Her thyroid is way off ,has she missed any doses or has she been taking with another supplement?? Increase to ,. I would send additional tablet she can take along with there tablet  Needs repeat TSH in 6 weeks - lab visit

## 2020-05-07 NOTE — Telephone Encounter (Signed)
If she could tell us her weight I could use that to try to estimate another dose

## 2020-05-08 NOTE — Telephone Encounter (Signed)
Sent a message thru MyChart to get her Wt.

## 2020-05-11 ENCOUNTER — Other Ambulatory Visit: Payer: Self-pay | Admitting: Family Medicine

## 2020-05-11 MED ORDER — LEVOTHYROXINE SODIUM 112 MCG PO TABS: 224.0000 ug | ORAL_TABLET | Freq: Every day | ORAL | 0 refills | Status: DC

## 2020-05-11 MED FILL — FREESTYLE LANCETS: 33 days supply | Qty: 100 | Fill #0

## 2020-05-11 MED FILL — SYNTHROID 112 MCG TABLET: 112 | 90 days supply | Qty: 180 | Fill #0

## 2020-05-11 MED FILL — FREESTYLE LITE TEST STRIP: 33 days supply | Qty: 100 | Fill #0

## 2020-05-23 ENCOUNTER — Other Ambulatory Visit: Payer: Self-pay | Admitting: Orthopedic Surgery

## 2020-05-23 ENCOUNTER — Encounter: Payer: Self-pay | Admitting: Family Medicine

## 2020-05-23 ENCOUNTER — Other Ambulatory Visit: Payer: Self-pay

## 2020-05-23 DIAGNOSIS — M5441 Lumbago with sciatica, right side: Secondary | ICD-10-CM

## 2020-05-23 MED FILL — GABAPENTIN 300 MG CAPSULE: 300 | 30 days supply | Qty: 90 | Fill #0

## 2020-05-23 MED FILL — OXYCODONE-APAP 5-325MG: 5-325 | 5 days supply | Qty: 20 | Fill #0

## 2020-05-23 MED FILL — predniSONE 5 MG (21) TBPK: 5 | 6 days supply | Qty: 21 | Fill #0

## 2020-05-28 ENCOUNTER — Ambulatory Visit: Payer: No Typology Code available for payment source | Admitting: Family Medicine

## 2020-06-02 ENCOUNTER — Ambulatory Visit (HOSPITAL_COMMUNITY)
Admission: RE | Admit: 2020-06-02 | Discharge: 2020-06-02 | Disposition: A | Discharge status: Home / Self Care | Payer: No Typology Code available for payment source | Source: Ambulatory Visit | Attending: Orthopedic Surgery | Admitting: Orthopedic Surgery

## 2020-06-02 ENCOUNTER — Other Ambulatory Visit: Payer: Self-pay

## 2020-06-02 DIAGNOSIS — M5441 Lumbago with sciatica, right side: Secondary | ICD-10-CM | POA: Diagnosis not present

## 2020-06-02 IMAGING — MR MR LUMBAR SPINE W/O CM
4 of 5 series · 18 of 48 positions shown · non-contrast
Comparison: None available.

CLINICAL DATA: Initial evaluation for low back pain with right hip
and thigh pain and right lower extremity numbness.

EXAM:
MRI LUMBAR SPINE WITHOUT CONTRAST
TECHNIQUE: Multiplanar, multisequence MR imaging of the lumbar spine was
performed. No intravenous contrast was administered.

[Series 2: T2 · sagittal · 4.0mm · 0.51mm/px · 6 of 15 slices shown (1 of 2)]
[im 1/15]
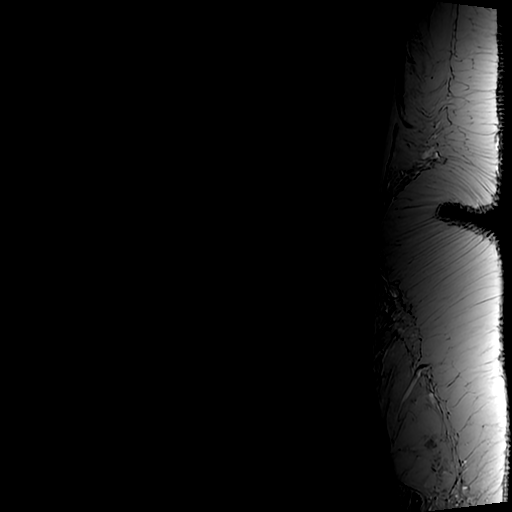
[im 3/15]
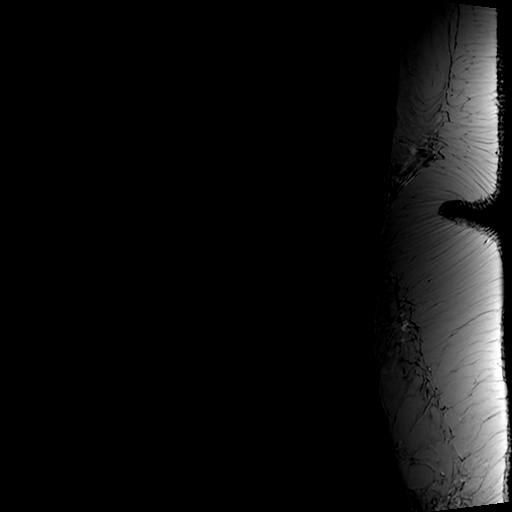
[im 6/15]
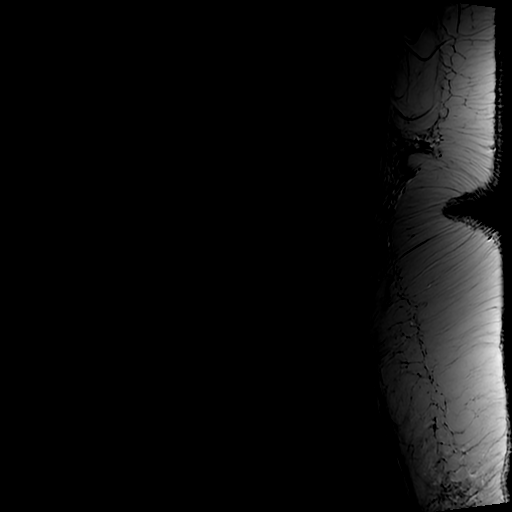
[im 9/15]
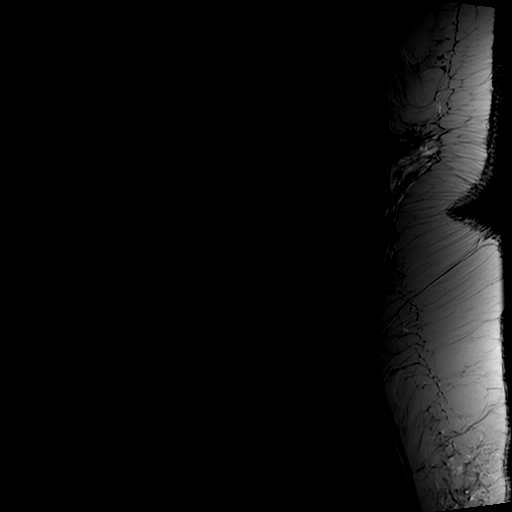
[im 12/15]
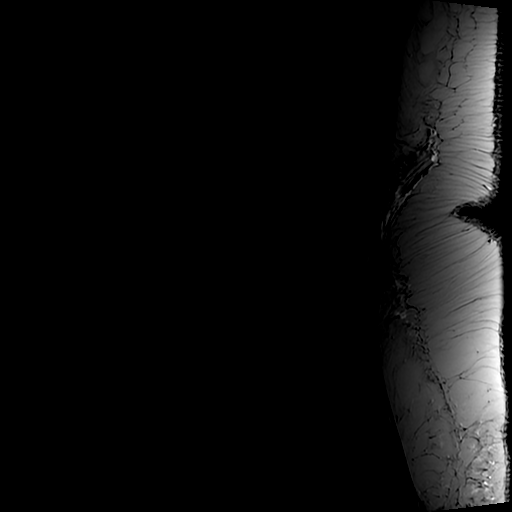
[im 15/15]
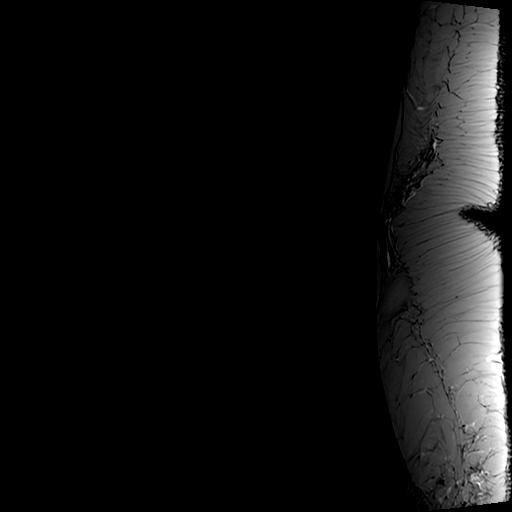

[Series 4: T2 · axial · 4.0mm · 0.39mm/px · z∈[-163,-15]mm · 6 of 33 slices shown (2 of 2)]
[im 1/33]
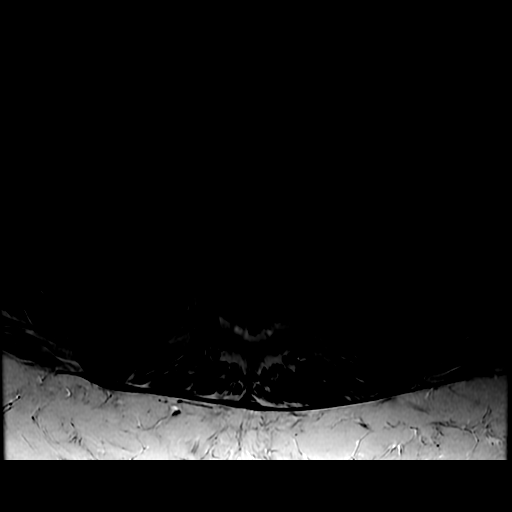
[im 5/33]
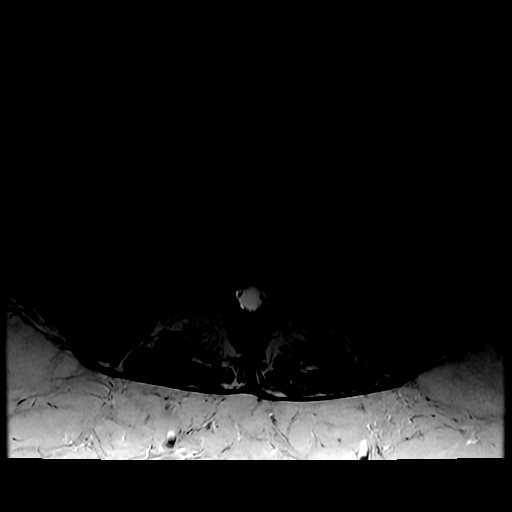
[im 10/33]
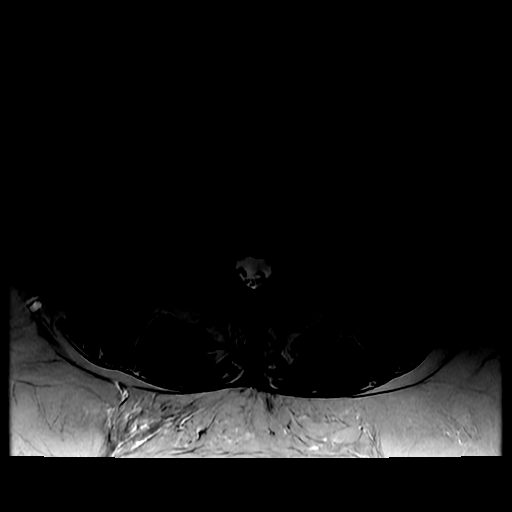
[im 14/33]
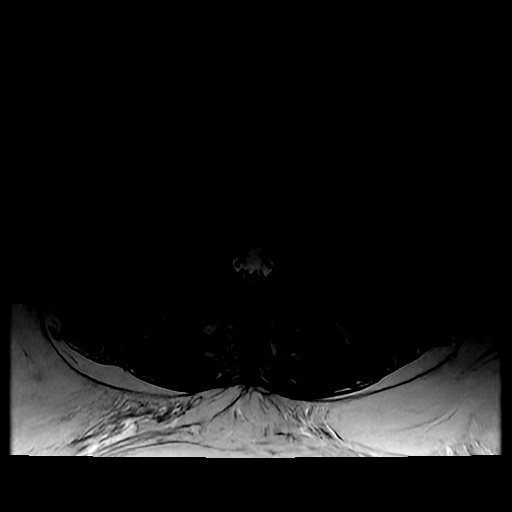
[im 17/33]
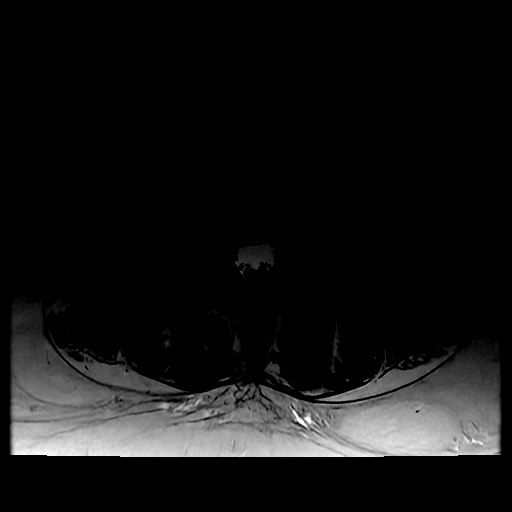
[im 28/33]
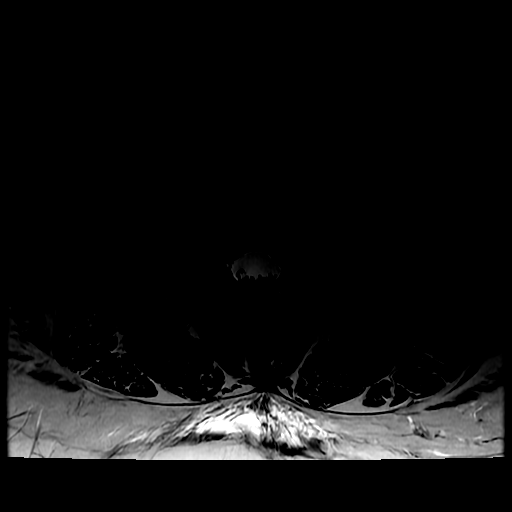

[Series 5: T1 · sagittal · 4.0mm · 0.51mm/px · 3 of 15 slices shown (1 of 2)]
[im 3/15]
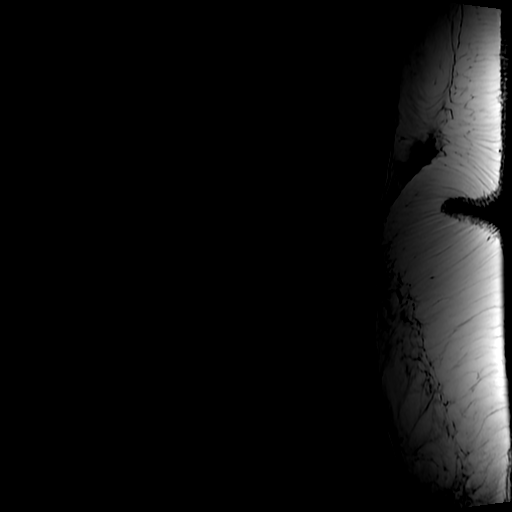
[im 8/15]
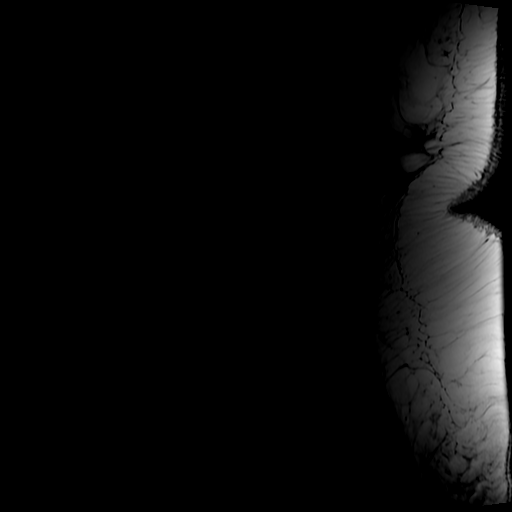
[im 12/15]
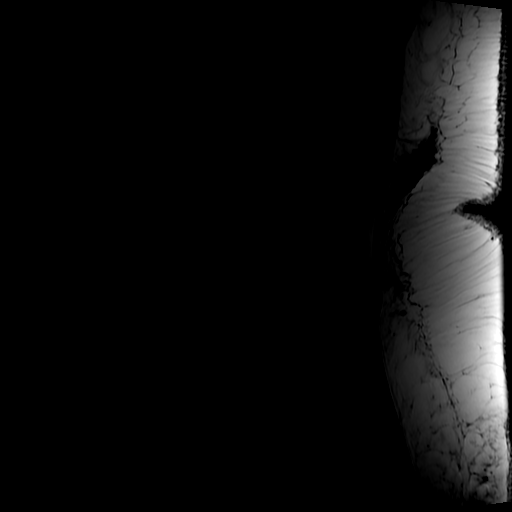

[Series 6: T1 · axial · 4.0mm · 0.39mm/px · z∈[-143,-25]mm · 3 of 31 slices shown (2 of 2)]
[im 5/31]
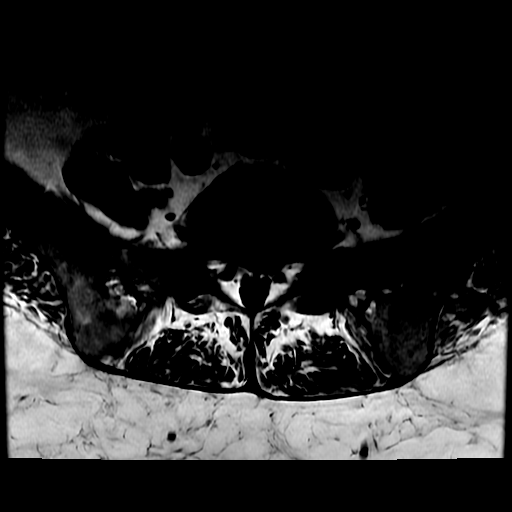
[im 17/31]
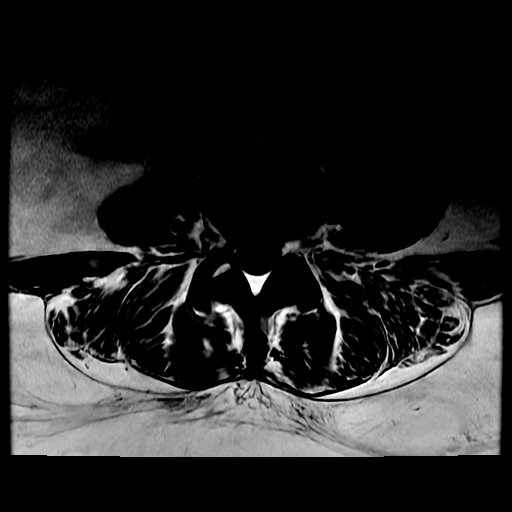
[im 26/31]
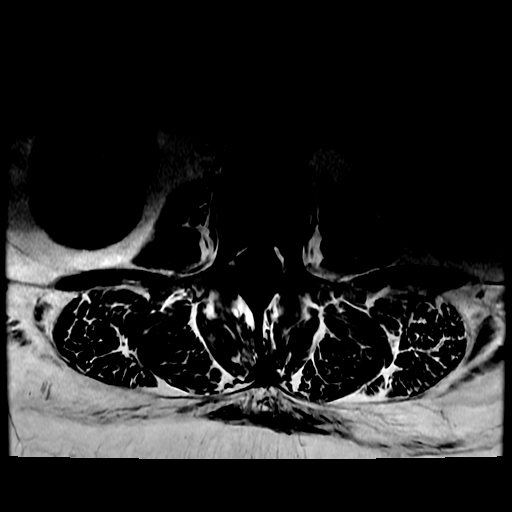

[18 of 48 positions shown; findings below may reference images not displayed]

FINDINGS: Segmentation: Standard. Lowest well-formed disc space labeled the
L5-S1 level.

Alignment: Physiologic with preservation of the normal lumbar
lordosis. No listhesis.

Vertebrae: Vertebral body height maintained without acute or chronic
fracture. Bone marrow signal intensity within normal limits. No
discrete or worrisome osseous lesions. Discogenic reactive endplate
change present about the L4-5 interspace. No abnormal marrow edema.

Conus medullaris and cauda equina: Conus extends to the L1 level.
Conus and cauda equina appear normal.

Paraspinal and other soft tissues: Unremarkable.

Disc levels:

L1-2:  Unremarkable.

L2-3:  Unremarkable.

L3-4: Mild disc bulge with disc desiccation. Superimposed right
subarticular disc extrusion with inferior migration (series 2, image
7). Extruded disc material contacts and likely affects the
descending right L4 nerve root. Superimposed mild facet hypertrophy.
Moderate right with mild left lateral recess stenosis. Central canal
remains patent. No significant foraminal encroachment.

L4-5: Degenerative intervertebral disc space narrowing with diffuse
disc bulge and disc desiccation. Reactive endplate change with mild
marginal endplate osteophytic spurring. Superimposed small central
disc protrusion with slight caudad angulation. Mild facet
hypertrophy. Resultant mild narrowing of the lateral recesses
bilaterally. Central canal remains patent. Mild right L4 foraminal
stenosis. Left neural foramina remains patent. No frank impingement.

L5-S1: Diffuse disc bulge with disc desiccation and intervertebral
disc space narrowing. Superimpose broad based central to left
subarticular disc protrusion (series 6, image 29). Descending left
S1 nerve root is slightly displaced posteriorly without frank
impingement. Superimposed mild facet hypertrophy. No significant
spinal stenosis. Foramina remain patent.
IMPRESSION: 1. Right subarticular disc extrusion with inferior migration at
L3-4, contacting and likely affecting the descending right L4 nerve
root.
2. Central to left subarticular disc protrusion at L5-S1, contacting
and slightly displacing the descending left S1 nerve root without
frank impingement.
3. Degenerative spondylosis at L4-5 with resultant mild bilateral
lateral recess stenosis, but no frank neural impingement.

## 2020-06-04 ENCOUNTER — Ambulatory Visit (HOSPITAL_COMMUNITY): Admission: RE | Admit: 2020-06-04 | Payer: No Typology Code available for payment source | Source: Ambulatory Visit

## 2020-06-05 ENCOUNTER — Other Ambulatory Visit (HOSPITAL_COMMUNITY): Payer: Self-pay | Admitting: Specialist

## 2020-06-05 MED FILL — OXYCODONE-APAP 5-325MG: 5-325 | 7 days supply | Qty: 14 | Fill #0

## 2020-07-31 MED FILL — FREESTYLE LITE TEST STRIP: 33 days supply | Qty: 100 | Fill #1

## 2020-07-31 MED FILL — TRULICITY 1.5 MG/0.5 ML PEN: 1.5 | 84 days supply | Qty: 6 | Fill #1

## 2020-09-07 ENCOUNTER — Encounter: Payer: Self-pay | Admitting: Family Medicine

## 2020-09-07 ENCOUNTER — Other Ambulatory Visit: Payer: Self-pay

## 2020-09-07 ENCOUNTER — Other Ambulatory Visit: Payer: Self-pay | Admitting: Family Medicine

## 2020-09-07 ENCOUNTER — Ambulatory Visit (INDEPENDENT_AMBULATORY_CARE_PROVIDER_SITE_OTHER): Payer: No Typology Code available for payment source | Admitting: Family Medicine

## 2020-09-07 VITALS — BP 118/64 | HR 82 | Temp 97.9°F | Resp 14

## 2020-09-07 DIAGNOSIS — Z6379 Other stressful life events affecting family and household: Secondary | ICD-10-CM

## 2020-09-07 DIAGNOSIS — E039 Hypothyroidism, unspecified: Secondary | ICD-10-CM

## 2020-09-07 DIAGNOSIS — E782 Mixed hyperlipidemia: Secondary | ICD-10-CM | POA: Diagnosis not present

## 2020-09-07 DIAGNOSIS — Z794 Long term (current) use of insulin: Secondary | ICD-10-CM

## 2020-09-07 DIAGNOSIS — E669 Obesity, unspecified: Secondary | ICD-10-CM

## 2020-09-07 DIAGNOSIS — E119 Type 2 diabetes mellitus without complications: Secondary | ICD-10-CM

## 2020-09-07 MED ORDER — TRIAMCINOLONE ACETONIDE 0.1 % EX CREA
1.0000 | TOPICAL_CREAM | Freq: Two times a day (BID) | CUTANEOUS | 0 refills | Status: DC
Start: 2020-09-07 — End: 2020-09-07

## 2020-09-07 MED ORDER — METFORMIN HCL 1000 MG PO TABS
1000.0000 mg | ORAL_TABLET | Freq: Two times a day (BID) | ORAL | 2 refills | Status: DC
Start: 2020-09-07 — End: 2020-09-07

## 2020-09-07 MED ORDER — OLOPATADINE HCL 0.2 % OP SOLN: 1.0000 [drp] | Freq: Every day | OPHTHALMIC | 1 refills | Status: DC | PRN

## 2020-09-07 MED ORDER — LEVOTHYROXINE SODIUM 112 MCG PO TABS
224.0000 ug | ORAL_TABLET | Freq: Every day | ORAL | 0 refills | Status: DC
Start: 2020-09-07 — End: 2020-09-07

## 2020-09-07 MED ORDER — CYCLOBENZAPRINE HCL 10 MG PO TABS: 10.0000 mg | ORAL_TABLET | Freq: Three times a day (TID) | ORAL | 0 refills | Status: DC | PRN

## 2020-09-07 MED ORDER — LORAZEPAM 0.5 MG PO TABS: 0.5000 mg | ORAL_TABLET | Freq: Two times a day (BID) | ORAL | 0 refills | Status: DC | PRN

## 2020-09-07 MED ORDER — TRULICITY 1.5 MG/0.5ML ~~LOC~~ SOAJ
SUBCUTANEOUS | 2 refills | Status: DC
Start: 2020-09-07 — End: 2020-09-07

## 2020-09-07 MED FILL — LORAZEPAM 0.5 MG TABS: 0.5 | 15 days supply | Qty: 30 | Fill #0

## 2020-09-07 MED FILL — SYNTHROID 112 MCG TABLET: 112 | 90 days supply | Qty: 180 | Fill #0

## 2020-09-07 MED FILL — METFORMIN HCL 1000 MG TABS: 1000 | 90 days supply | Qty: 180 | Fill #0

## 2020-09-07 MED FILL — CYCLOBENZAPRINE HCL 10 MG T: 10 | 7 days supply | Qty: 20 | Fill #0

## 2020-09-07 MED FILL — TRIAMCINOLONE 0.1% CREAM: 0.1 | 15 days supply | Qty: 30 | Fill #0

## 2020-09-07 NOTE — Addendum Note (Signed)
Addended by: Milinda Antis F on: 09/07/2020 09:00 AM   Modules accepted: Orders

## 2020-09-07 NOTE — Assessment & Plan Note (Signed)
DM has been controlled, she would benefit from metformin in setting of insulin resistance nature and metabolic syndrome  Discussed she can even take once a day consistently with trulicity this will help with weight reduction

## 2020-09-07 NOTE — Progress Notes (Signed)
   Subjective:    Patient ID: Grace Hawkins, female    DOB: December 08, 1975, 45 y.o.   MRN: [id number]  Patient presents for Follow-up (Is fasting/)  Patient here to follow-up on medical problems.  Medications reviewed.  Her husband is currently going through cancer treatment for his metastatic bile duct cancer.  Some days are very stressful for her but she feels like she is handling the best he can.  Sleep is fair.  She is still working her full schedule.  Diabetes mellitus type 2 she has not been consistent with the Metformin but she does take her Trulicity injection weekly.  She has not checked her blood sugars.  She has been concentrating on getting treatments and for her husband.  Thyroidism she is on her thyroid medication significantly.  224 mcg due for recheck today.  Back pain she was seen by orthopedics she does have a bulging disc in her lumbar spine also has some hip pain.  They wanted to do injections and physical therapy but this occurred in the same time as her husband's cancer diagnosis that she is holding.  She does use Flexeril sparingly needs a refill on this.  They also gave her gabapentin but she has not been using this consistently.   Review Of Systems:  GEN- denies fatigue, fever, weight loss,weakness, recent illness HEENT- denies eye drainage, change in vision, nasal discharge, CVS- denies chest pain, palpitations RESP- denies SOB, cough, wheeze ABD- denies N/V, change in stools, abd pain GU- denies dysuria, hematuria, dribbling, incontinence MSK- + joint pain, muscle aches, injury Neuro- denies headache, dizziness, syncope, seizure activity       Objective:    BP 118/64   Pulse 82   Temp 97.9 F (36.6 C) (Temporal)   Resp 14   LMP 08/23/2020 (Approximate) Comment: regular  SpO2 98%  GEN- NAD, alert and oriented x3, obese ,pt declines weighing  HEENT- PERRL, EOMI, non injected sclera, pink conjunctiva, MMM, oropharynx clear Neck- Supple, no thyromegaly CVS-  RRR, no murmur RESP-CTAB ABD-NABS,soft,NT,ND PSYCH tearful at times, not overly depressed or anxious , well groomed  EXT- No edema Pulses- Radial, DP- 2+        Assessment & Plan:      Problem List Items Addressed This Visit      Unprioritized   Class 3 obesity   Hyperlipidemia, mixed    Recheck lipids       Relevant Orders   Lipid panel   Hypothyroid - Primary    Recheck TFT continue replacement       Relevant Orders   TSH   T3, free   T4, free   Type 2 diabetes mellitus without complications (HCC)    DM has been controlled, she would benefit from metformin in setting of insulin resistance nature and metabolic syndrome  Discussed she can even take once a day consistently with trulicity this will help with weight reduction      Relevant Orders   CBC with Differential/Platelet   Comprehensive metabolic panel   Hemoglobin A1c   Lipid panel    Other Visit Diagnoses    Stress due to illness of family member       prn ativan given. She has support, husband doing well thus far with treatments       Note: This dictation was prepared with Dragon dictation along with smaller phrase technology. Any transcriptional errors that result from this process are unintentional.

## 2020-09-07 NOTE — Assessment & Plan Note (Signed)
Recheck lipids

## 2020-09-07 NOTE — Assessment & Plan Note (Signed)
Recheck TFT continue replacement 

## 2020-09-07 NOTE — Patient Instructions (Signed)
F/U Dr. Tanya Nones  4 months

## 2020-09-08 LAB — HEMOGLOBIN A1C
Hgb A1c MFr Bld: 4.9 % of total Hgb (ref <5.7)
Mean Plasma Glucose: 94 mg/dL
eAG (mmol/L): 5.2 mmol/L

## 2020-09-08 LAB — CBC WITH DIFFERENTIAL/PLATELET
Absolute Monocytes: 606 cells/uL (ref 200–950)
Basophils Absolute: 58 cells/uL (ref 0–200)
Basophils Relative: 0.7 %
Eosinophils Absolute: 241 cells/uL (ref 15–500)
Eosinophils Relative: 2.9 %
HCT: 40.4 % (ref 35.0–45.0)
Hemoglobin: 13.3 g/dL (ref 11.7–15.5)
Lymphs Abs: 2009 cells/uL (ref 850–3900)
MCH: 28.6 pg (ref 27.0–33.0)
MCHC: 32.9 g/dL (ref 32.0–36.0)
MCV: 86.9 fL (ref 80.0–100.0)
MPV: 11.8 fL (ref 7.5–12.5)
Monocytes Relative: 7.3 %
Neutro Abs: 5387 cells/uL (ref 1500–7800)
Neutrophils Relative %: 64.9 %
Platelets: 258 10*3/uL (ref 140–400)
RBC: 4.65 10*6/uL (ref 3.80–5.10)
RDW: 13.3 % (ref 11.0–15.0)
Total Lymphocyte: 24.2 %
WBC: 8.3 10*3/uL (ref 3.8–10.8)

## 2020-09-08 LAB — TSH: TSH: 7.67 mIU/L — ABNORMAL HIGH

## 2020-09-08 LAB — COMPREHENSIVE METABOLIC PANEL
AG Ratio: 1.4 (calc) (ref 1.0–2.5)
ALT: 23 U/L (ref 6–29)
AST: 18 U/L (ref 10–30)
Albumin: 4 g/dL (ref 3.6–5.1)
Alkaline phosphatase (APISO): 43 U/L (ref 31–125)
BUN: 23 mg/dL (ref 7–25)
CO2: 25 mmol/L (ref 20–32)
Calcium: 9.3 mg/dL (ref 8.6–10.2)
Chloride: 103 mmol/L (ref 98–110)
Creat: 0.91 mg/dL (ref 0.50–1.10)
Globulin: 2.9 g/dL (calc) (ref 1.9–3.7)
Glucose, Bld: 105 mg/dL — ABNORMAL HIGH (ref 65–99)
Potassium: 4.1 mmol/L (ref 3.5–5.3)
Sodium: 138 mmol/L (ref 135–146)
Total Bilirubin: 0.4 mg/dL (ref 0.2–1.2)
Total Protein: 6.9 g/dL (ref 6.1–8.1)

## 2020-09-08 LAB — T3, FREE: T3, Free: 2.5 pg/mL (ref 2.3–4.2)

## 2020-09-08 LAB — T4, FREE: Free T4: 1.1 ng/dL (ref 0.8–1.8)

## 2020-09-08 LAB — LIPID PANEL
Cholesterol: 205 mg/dL — ABNORMAL HIGH (ref <200)
HDL: 52 mg/dL (ref >=50)
LDL Cholesterol (Calc): 115 mg/dL (calc) — ABNORMAL HIGH
Non-HDL Cholesterol (Calc): 153 mg/dL (calc) — ABNORMAL HIGH (ref <130)
Total CHOL/HDL Ratio: 3.9 (calc) (ref <5.0)
Triglycerides: 263 mg/dL — ABNORMAL HIGH (ref <150)

## 2020-10-01 ENCOUNTER — Other Ambulatory Visit: Payer: Self-pay

## 2020-10-01 ENCOUNTER — Other Ambulatory Visit (HOSPITAL_COMMUNITY): Payer: Self-pay

## 2020-10-04 ENCOUNTER — Other Ambulatory Visit (HOSPITAL_COMMUNITY): Payer: Self-pay

## 2020-10-04 ENCOUNTER — Telehealth: Payer: Self-pay | Admitting: Family Medicine

## 2020-10-04 MED ORDER — SYNTHROID 112 MCG PO TABS
ORAL_TABLET | Freq: Every day | ORAL | 0 refills | Status: DC
  Filled 2020-10-04: qty 180, 90d supply, fill #0

## 2020-10-04 NOTE — Telephone Encounter (Signed)
Prescription sent to pharmacy.

## 2020-10-04 NOTE — Telephone Encounter (Signed)
Received call from [facility] they have a new system and need a new refill request for patients synthroid 112 mcg

## 2020-10-05 ENCOUNTER — Other Ambulatory Visit (HOSPITAL_COMMUNITY): Payer: Self-pay

## 2020-10-18 ENCOUNTER — Other Ambulatory Visit (HOSPITAL_COMMUNITY): Payer: Self-pay

## 2020-11-09 ENCOUNTER — Other Ambulatory Visit (HOSPITAL_BASED_OUTPATIENT_CLINIC_OR_DEPARTMENT_OTHER): Payer: Self-pay

## 2020-11-09 MED FILL — Dulaglutide Soln Auto-injector 1.5 MG/0.5ML: SUBCUTANEOUS | 84 days supply | Qty: 6 | Fill #0 | Status: AC

## 2020-11-15 ENCOUNTER — Ambulatory Visit: Payer: No Typology Code available for payment source | Attending: Critical Care Medicine

## 2020-11-15 DIAGNOSIS — Z20822 Contact with and (suspected) exposure to covid-19: Secondary | ICD-10-CM

## 2020-11-16 LAB — SARS-COV-2, NAA 2 DAY TAT

## 2020-11-16 LAB — NOVEL CORONAVIRUS, NAA: SARS-CoV-2, NAA: NOT DETECTED

## 2020-12-06 ENCOUNTER — Other Ambulatory Visit (HOSPITAL_COMMUNITY): Payer: Self-pay

## 2020-12-16 ENCOUNTER — Encounter: Payer: Self-pay | Admitting: Family

## 2020-12-16 ENCOUNTER — Telehealth: Payer: No Typology Code available for payment source | Admitting: Family

## 2020-12-16 DIAGNOSIS — U071 COVID-19: Secondary | ICD-10-CM | POA: Diagnosis not present

## 2020-12-16 MED ORDER — BENZONATATE 100 MG PO CAPS: 100.0000 mg | ORAL_CAPSULE | Freq: Three times a day (TID) | ORAL | 0 refills | Status: DC | PRN

## 2020-12-16 MED ORDER — NIRMATRELVIR/RITONAVIR (PAXLOVID)TABLET: 3.0000 | ORAL_TABLET | Freq: Two times a day (BID) | ORAL | 0 refills | Status: AC

## 2020-12-16 MED ORDER — FLUTICASONE PROPIONATE 50 MCG/ACT NA SUSP: 2.0000 | Freq: Every day | NASAL | 6 refills | Status: DC

## 2020-12-16 NOTE — Progress Notes (Signed)
Virtual Visit Consent   Grace Hawkins, you are scheduled for a virtual visit with a [facility] provider today.     Just as with appointments in the office, your consent must be obtained to participate.  Your consent will be active for this visit and any virtual visit you may have with one of our providers in the next 365 days.     If you have a MyChart account, a copy of this consent can be sent to you electronically.  All virtual visits are billed to your insurance company just like a traditional visit in the office.    As this is a virtual visit, video technology does not allow for your provider to perform a traditional examination.  This may limit your provider's ability to fully assess your condition.  If your provider identifies any concerns that need to be evaluated in person or the need to arrange testing (such as labs, EKG, etc.), we will make arrangements to do so.     Although advances in technology are sophisticated, we cannot ensure that it will always work on either your end or our end.  If the connection with a video visit is poor, the visit may have to be switched to a telephone visit.  With either a video or telephone visit, we are not always able to ensure that we have a secure connection.     I need to obtain your verbal consent now.   Are you willing to proceed with your visit today?    Grace Hawkins has provided verbal consent on 12/16/2020 for a virtual visit (video or telephone).   Evelina Dun, FNP   Date: 12/16/2020 8:00 PM   Virtual Visit via Video Note   IEvelina Dun, connected withNAME@ ([id number], 05-Apr-1976) on 12/16/20 at  8:00 PM EDT by a video-enabled telemedicine application and verified that I am speaking with the correct person using two identifiers.  Location: Patient: Virtual Visit Location Patient: Home Provider: Virtual Visit Location Provider: Home   I discussed the limitations of evaluation and management by telemedicine and the  availability of in person appointments. The patient expressed understanding and agreed to proceed.    History of Present Illness: Grace Hawkins is a 45 y.o. who identifies as a female who was assigned female at birth, and is being seen today for COVID. She reports her symptoms started yesterday and tested positive today.   HPI: Sore Throat  This is a new problem. The current episode started yesterday. The problem has been unchanged. The maximum temperature recorded prior to her arrival was 100.4 - 100.9 F. The pain is moderate. Associated symptoms include congestion, coughing, ear pain, headaches, shortness of breath and trouble swallowing. She has tried acetaminophen for the symptoms. The treatment provided mild relief.   Problems:  Patient Active Problem List   Diagnosis Date Noted   Bulging lumbar disc 05/04/2020   Class 3 obesity 04/04/2019   Hyperlipidemia, mixed 09/16/2018   Type 2 diabetes mellitus without complications ([facility]) 33/00/7622   Hypothyroid     Allergies: No Known Allergies Medications:  Current Outpatient Medications:    benzonatate (TESSALON PERLES) 100 MG capsule, Take 1 capsule (100 mg total) by mouth 3 (three) times daily as needed., Disp: 20 capsule, Rfl: 0   fluticasone (FLONASE) 50 MCG/ACT nasal spray, Place 2 sprays into both nostrils daily., Disp: 16 g, Rfl: 6   nirmatrelvir/ritonavir EUA (PAXLOVID) TABS, Take 3 tablets by mouth 2 (two) times daily for 5  days. (Take nirmatrelvir 150 mg two tablets twice daily for 5 days and ritonavir 100 mg one tablet twice daily for 5 days), Disp: 30 tablet, Rfl: 0   Blood Glucose Monitoring Suppl KIT, Use to check blood sugar up to 3 times daily ICD 10: R73.09, Dispense based on insurance preference, Disp: 1 each, Rfl: 0   cetirizine (ZYRTEC) 10 MG tablet, Take 1 tablet (10 mg total) by mouth daily as needed for allergies., Disp: 30 tablet, Rfl: 11   cyclobenzaprine (FLEXERIL) 10 MG tablet, TAKE 1 TABLET BY MOUTH 3 TIMES DAILY AS  NEEDED FOR MUSCLE SPASMS., Disp: 20 tablet, Rfl: 0   Dulaglutide 1.5 MG/0.5ML SOPN, INJECT 1.5 MG INTO THE SKIN ONCE A WEEK., Disp: 6 mL, Rfl: 2   gabapentin (NEURONTIN) 300 MG capsule, Take 1 tab TID prn, Disp: 90 capsule, Rfl: 3   gabapentin (NEURONTIN) 300 MG capsule, TAKE 1 CAPSULE BY MOUTH AT BEDTIME FOR 3 DAYS, 1 CAPSULE 2 TIMES DAILY FOR 3 DAYS THEN 1 CAPSULE 3 TIMES DAILY, Disp: 90 capsule, Rfl: 0   glucose blood test strip, USE TO CHECK BLOOD SUGAR UP TO 3 TIMES DAILY, Disp: 100 strip, Rfl: 12   Lancets (FREESTYLE) lancets, USE TO CHECK BLOOD SUGAR., Disp: 100 each, Rfl: 5   Lancets Ultra Thin 30G MISC, Use to check blood sugar. Dispense based on insurance preference ICD10:R73.09, Disp: 100 each, Rfl: 5   SYNTHROID 112 MCG tablet, TAKE 2 TABLETS BY MOUTH DAILY., Disp: 180 tablet, Rfl: 0   LORazepam (ATIVAN) 0.5 MG tablet, TAKE 1 TABLET BY MOUTH 2 TIMES DAILY AS NEEDED FOR ANXIETY., Disp: 30 tablet, Rfl: 0   metFORMIN (GLUCOPHAGE) 1000 MG tablet, TAKE 1 TABLET BY MOUTH 2 TIMES DAILY WITH A MEAL., Disp: 180 tablet, Rfl: 2   Olopatadine HCl 0.2 % SOLN, APPLY 1 DROP TO EYE DAILY AS NEEDED AS DIRECTED, Disp: 2.5 mL, Rfl: 1   predniSONE (STERAPRED UNI-PAK 21 TAB) 5 MG (21) TBPK tablet, TAKE AS DIRECTED PER PACKAGE, Disp: 21 each, Rfl: 1   triamcinolone (KENALOG) 0.1 %, APPLY 1 APPLICATION TOPICALLY 2 TIMES DAILY AS DIRECTED, Disp: 30 g, Rfl: 0  Observations/Objective: Patient is well-developed, well-nourished in no acute distress.  Resting comfortably  at home.  Head is normocephalic, atraumatic.  No labored breathing. Intermittent nonproductive cough Speech is clear and coherent with logical content.  Patient is alert and oriented at baseline.    Assessment and Plan: 1. COVID-19 virus detected - nirmatrelvir/ritonavir EUA (PAXLOVID) TABS; Take 3 tablets by mouth 2 (two) times daily for 5 days. (Take nirmatrelvir 150 mg two tablets twice daily for 5 days and ritonavir 100 mg one tablet  twice daily for 5 days)  Dispense: 30 tablet; Refill: 0 - MyChart COVID-19 home monitoring program; Future - fluticasone (FLONASE) 50 MCG/ACT nasal spray; Place 2 sprays into both nostrils daily.  Dispense: 16 g; Refill: 6 - benzonatate (TESSALON PERLES) 100 MG capsule; Take 1 capsule (100 mg total) by mouth 3 (three) times daily as needed.  Dispense: 20 capsule; Refill: 0 COVID positive, rest, force fluids, tylenol as needed, Quarantine for at least 5 days and fever free, report any worsening symptoms such as increased shortness of breath, swelling, or continued high fevers.    Follow Up Instructions: I discussed the assessment and treatment plan with the patient. The patient was provided an opportunity to ask questions and all were answered. The patient agreed with the plan and demonstrated an understanding of the instructions.  A copy  of instructions were sent to the patient via [facility].  The patient was advised to call back or seek an in-person evaluation if the symptoms worsen or if the condition fails to improve as anticipated.  Time:  I spent 7 minutes with the patient via telehealth technology discussing the above problems/concerns.    Evelina Dun, FNP

## 2020-12-24 ENCOUNTER — Other Ambulatory Visit (HOSPITAL_BASED_OUTPATIENT_CLINIC_OR_DEPARTMENT_OTHER): Payer: Self-pay

## 2020-12-24 MED ORDER — CARESTART COVID-19 HOME TEST VI KIT
PACK | 0 refills | Status: DC
Start: 2020-12-24 — End: 2021-01-11
  Filled 2020-12-24: qty 4, 8d supply, fill #0

## 2020-12-28 ENCOUNTER — Other Ambulatory Visit (HOSPITAL_BASED_OUTPATIENT_CLINIC_OR_DEPARTMENT_OTHER): Payer: Self-pay

## 2020-12-28 MED FILL — Glucose Blood Test Strip: 30 days supply | Qty: 100 | Fill #0 | Status: AC

## 2021-01-07 ENCOUNTER — Emergency Department (HOSPITAL_COMMUNITY)
Admission: EM | Admit: 2021-01-07 | Discharge: 2021-01-07 | Disposition: A | Discharge status: Home / Self Care | Payer: No Typology Code available for payment source | Attending: Emergency Medicine | Admitting: Emergency Medicine

## 2021-01-07 ENCOUNTER — Telehealth: Payer: Self-pay | Admitting: Family Medicine

## 2021-01-07 ENCOUNTER — Other Ambulatory Visit: Payer: Self-pay

## 2021-01-07 ENCOUNTER — Emergency Department (HOSPITAL_COMMUNITY): Payer: No Typology Code available for payment source

## 2021-01-07 ENCOUNTER — Other Ambulatory Visit (HOSPITAL_COMMUNITY): Payer: Self-pay

## 2021-01-07 DIAGNOSIS — Z7984 Long term (current) use of oral hypoglycemic drugs: Secondary | ICD-10-CM | POA: Insufficient documentation

## 2021-01-07 DIAGNOSIS — R197 Diarrhea, unspecified: Secondary | ICD-10-CM | POA: Insufficient documentation

## 2021-01-07 DIAGNOSIS — Z79899 Other long term (current) drug therapy: Secondary | ICD-10-CM | POA: Diagnosis not present

## 2021-01-07 DIAGNOSIS — E119 Type 2 diabetes mellitus without complications: Secondary | ICD-10-CM | POA: Diagnosis not present

## 2021-01-07 DIAGNOSIS — E039 Hypothyroidism, unspecified: Secondary | ICD-10-CM | POA: Insufficient documentation

## 2021-01-07 DIAGNOSIS — R1011 Right upper quadrant pain: Secondary | ICD-10-CM | POA: Insufficient documentation

## 2021-01-07 DIAGNOSIS — R1013 Epigastric pain: Secondary | ICD-10-CM | POA: Diagnosis not present

## 2021-01-07 LAB — CBC WITH DIFFERENTIAL/PLATELET
Abs Immature Granulocytes: 0.02 10*3/uL (ref 0.00–0.07)
Basophils Absolute: 0.1 10*3/uL (ref 0.0–0.1)
Basophils Relative: 1 %
Eosinophils Absolute: 1.6 10*3/uL — ABNORMAL HIGH (ref 0.0–0.5)
Eosinophils Relative: 13 %
HCT: 40.5 % (ref 36.0–46.0)
Hemoglobin: 12.8 g/dL (ref 12.0–15.0)
Immature Granulocytes: 0 %
Lymphocytes Relative: 30 %
Lymphs Abs: 3.7 10*3/uL (ref 0.7–4.0)
MCH: 27.6 pg (ref 26.0–34.0)
MCHC: 31.6 g/dL (ref 30.0–36.0)
MCV: 87.5 fL (ref 80.0–100.0)
Monocytes Absolute: 0.8 10*3/uL (ref 0.1–1.0)
Monocytes Relative: 6 %
Neutro Abs: 6.1 10*3/uL (ref 1.7–7.7)
Neutrophils Relative %: 50 %
Platelets: 282 10*3/uL (ref 150–400)
RBC: 4.63 MIL/uL (ref 3.87–5.11)
RDW: 14.6 % (ref 11.5–15.5)
WBC: 12.3 10*3/uL — ABNORMAL HIGH (ref 4.0–10.5)
nRBC: 0 % (ref 0.0–0.2)

## 2021-01-07 LAB — URINALYSIS, ROUTINE W REFLEX MICROSCOPIC
Bilirubin Urine: NEGATIVE
Glucose, UA: NEGATIVE mg/dL
Hgb urine dipstick: NEGATIVE
Ketones, ur: NEGATIVE mg/dL
Leukocytes,Ua: NEGATIVE
Nitrite: NEGATIVE
Protein, ur: NEGATIVE mg/dL
Specific Gravity, Urine: 1.02 (ref 1.005–1.030)
pH: 5 (ref 5.0–8.0)

## 2021-01-07 LAB — COMPREHENSIVE METABOLIC PANEL
ALT: 27 U/L (ref 0–44)
AST: 24 U/L (ref 15–41)
Albumin: 3.4 g/dL — ABNORMAL LOW (ref 3.5–5.0)
Alkaline Phosphatase: 45 U/L (ref 38–126)
Anion gap: 7 (ref 5–15)
BUN: 9 mg/dL (ref 6–20)
CO2: 25 mmol/L (ref 22–32)
Calcium: 9.4 mg/dL (ref 8.9–10.3)
Chloride: 106 mmol/L (ref 98–111)
Creatinine, Ser: 0.8 mg/dL (ref 0.44–1.00)
GFR, Estimated: >60 mL/min (ref >60)
Glucose, Bld: 103 mg/dL — ABNORMAL HIGH (ref 70–99)
Potassium: 3.6 mmol/L (ref 3.5–5.1)
Sodium: 138 mmol/L (ref 135–145)
Total Bilirubin: 0.3 mg/dL (ref 0.3–1.2)
Total Protein: 6.8 g/dL (ref 6.5–8.1)

## 2021-01-07 LAB — LIPASE, BLOOD: Lipase: 51 U/L (ref 11–51)

## 2021-01-07 IMAGING — US US ABDOMEN LIMITED
1 series · 14 of 25 positions shown · non-contrast
Comparison: CT of the abdomen pelvis dated [DATE].

CLINICAL DATA: 44-year-old female with right upper quadrant
abdominal pain, nausea vomiting.

EXAM:
ULTRASOUND ABDOMEN LIMITED RIGHT UPPER QUADRANT

[Series 1: us abdomen limited · 48 acquisitions, 14 frames shown]
[im 1/48]
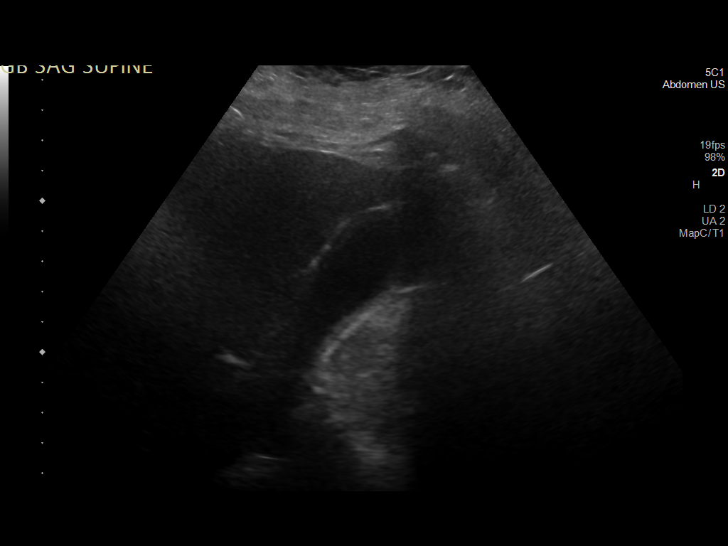
[im 4/48]
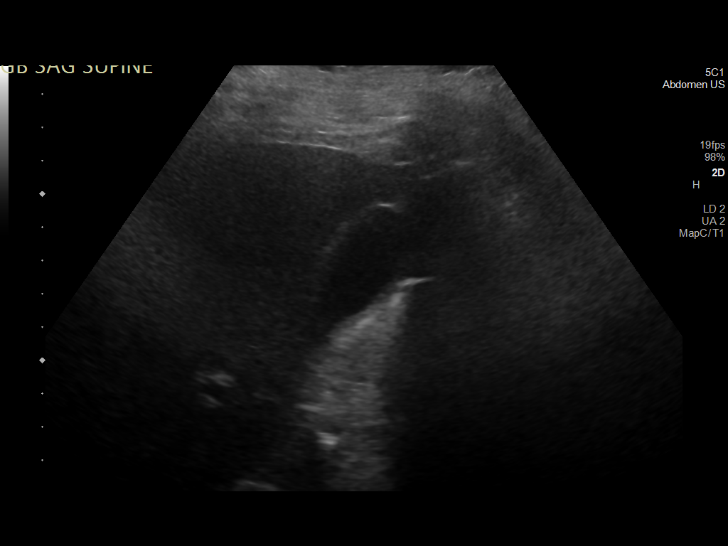
[im 8/48]
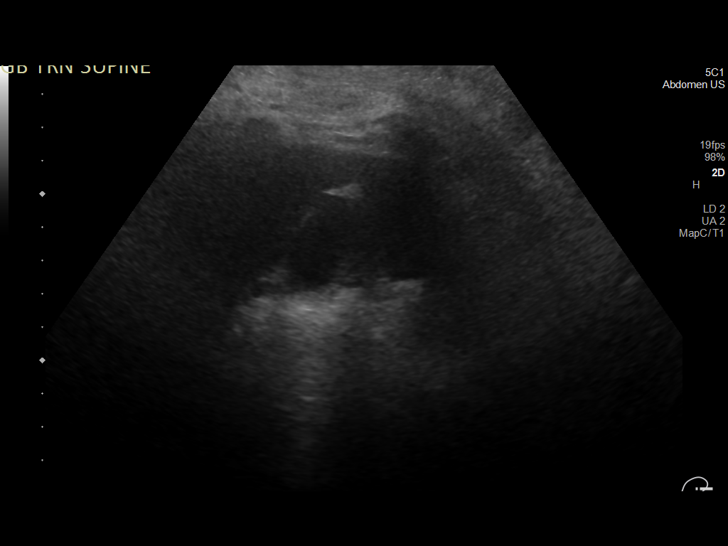
[im 12/48]
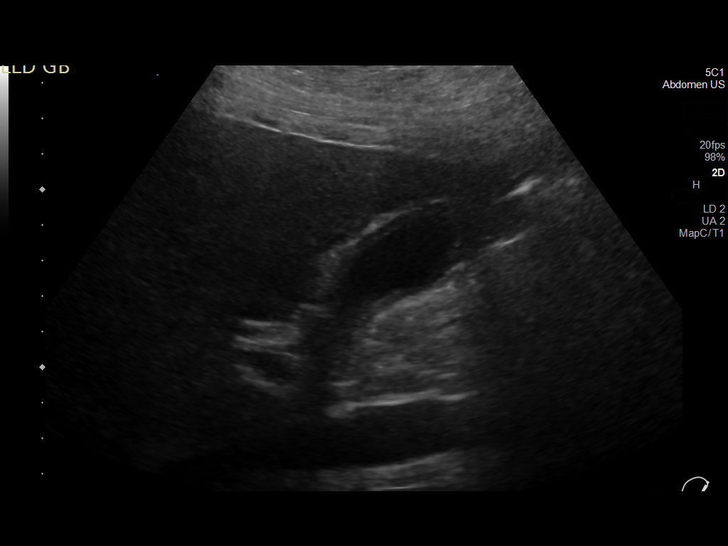
[im 16/48]
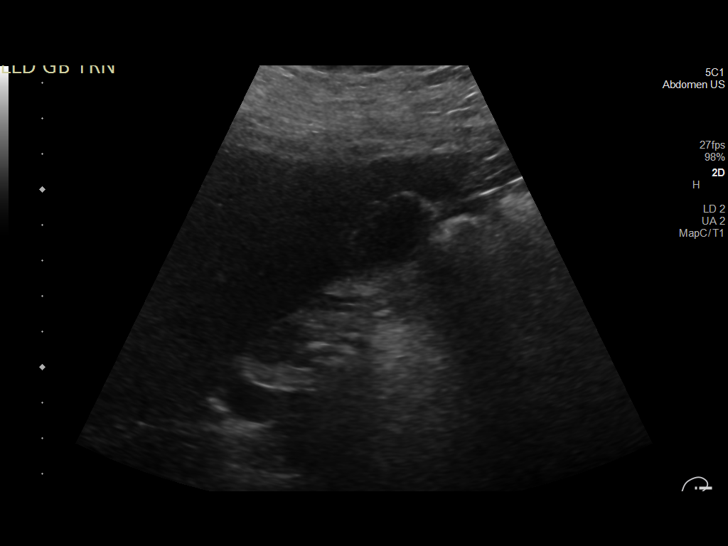
[im 18/48]
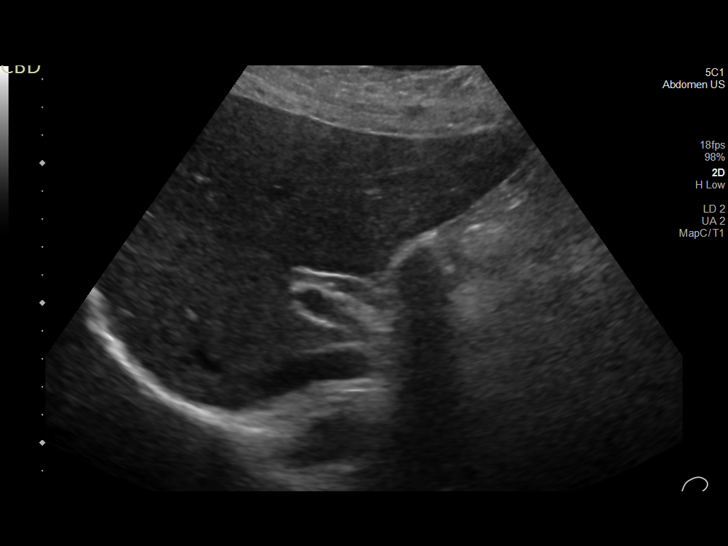
[im 22/48]
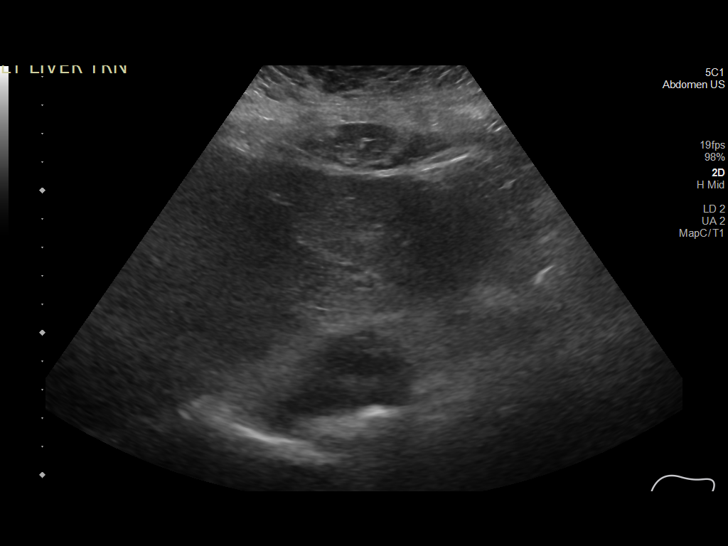
[im 26/48]
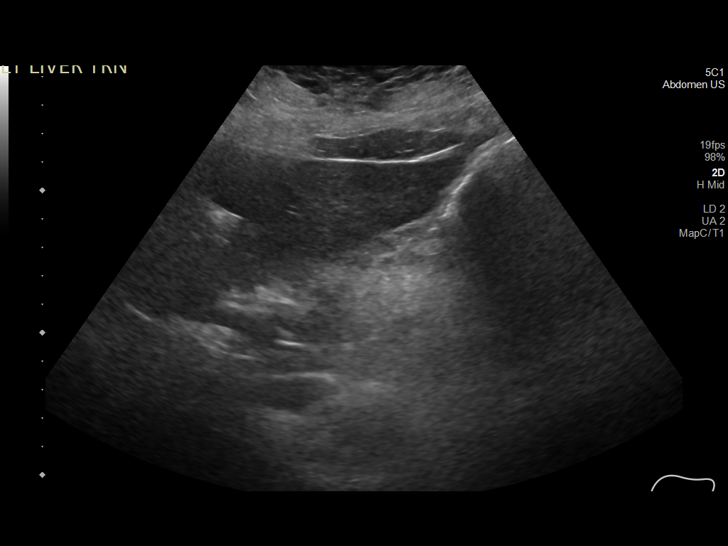
[im 30/48]
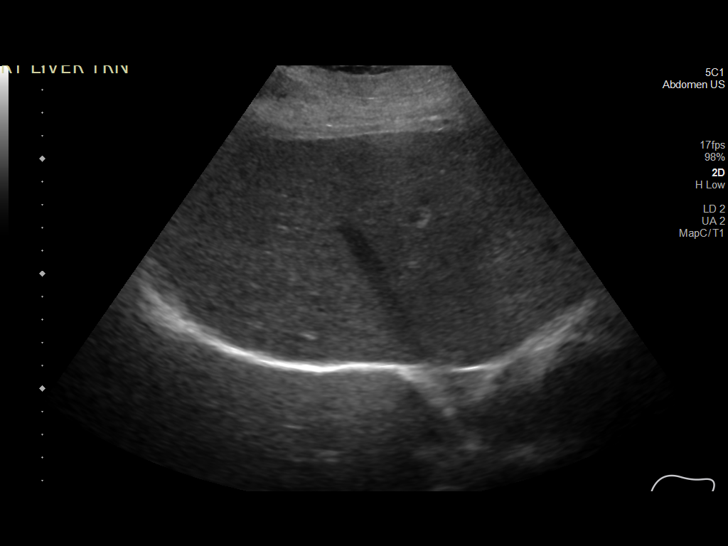
[im 32/48]
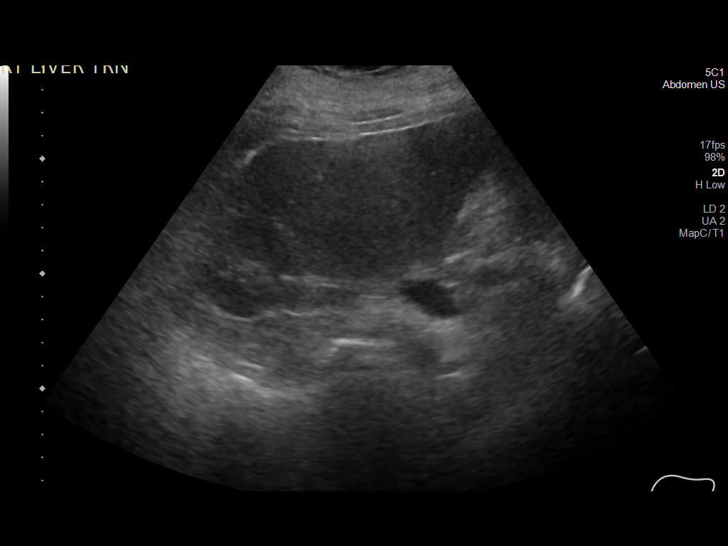
[im 36/48]
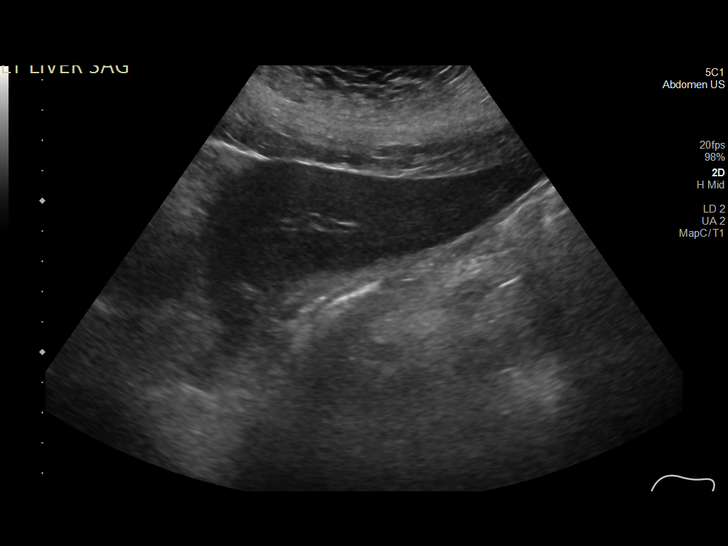
[im 40/48]
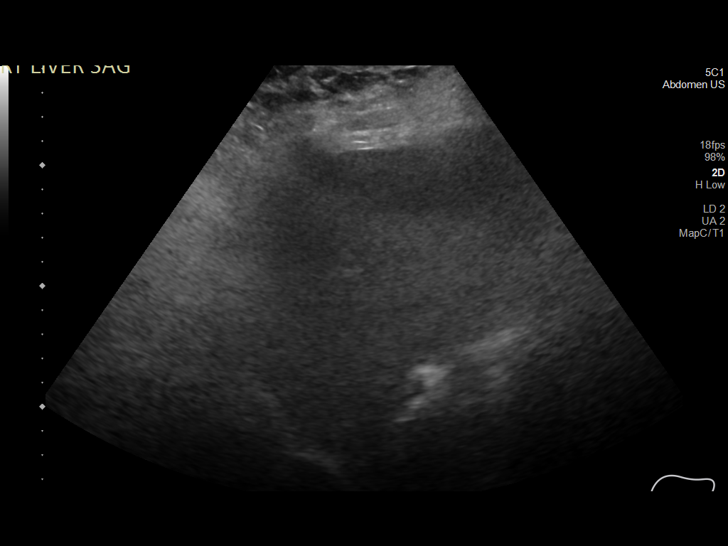
[im 44/48]
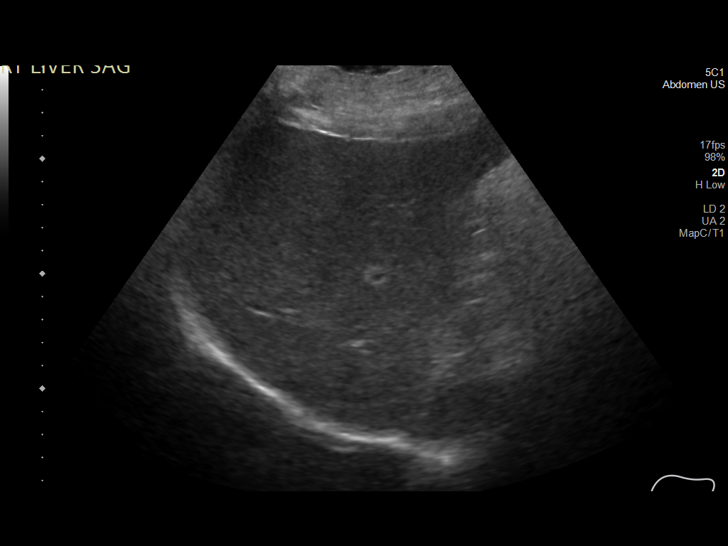
[im 48/48]
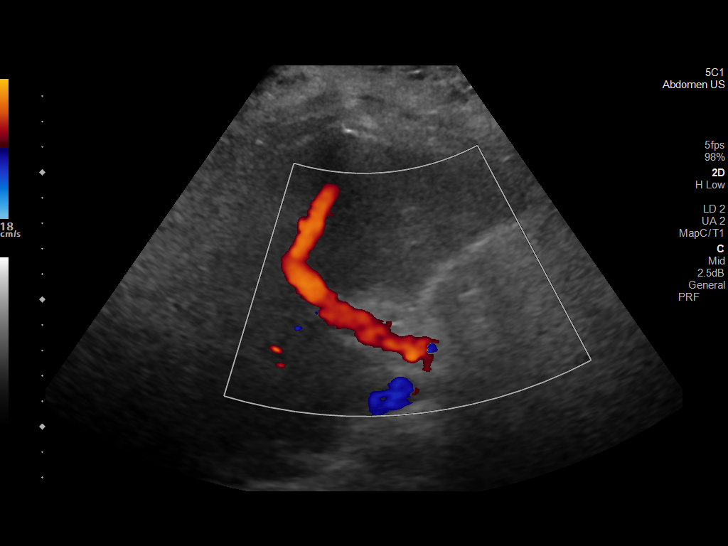

[14 of 25 positions shown; findings below may reference images not displayed]

FINDINGS: Evaluation is limited due to body habitus.

Gallbladder:

No definite gallstone. The gallbladder wall appears slightly
thickened measuring 5 mm. However, this may be related to over
measurement. No pericholecystic fluid. Positive sonographic Murphy's
sign reported.

Common bile duct:

Diameter: 5 mm

Liver:

There is diffuse increased liver echogenicity most commonly seen in
the setting of fatty infiltration. Superimposed inflammation or
fibrosis is not excluded. Clinical correlation is recommended.
Portal vein is patent on color Doppler imaging with normal direction
of blood flow towards the liver.

Other: None.
IMPRESSION: 1. No definite gallstone. A hepatobiliary scintigraphy may provide
better evaluation of the gallbladder if there is a high clinical
concern for acute cholecystitis .
2. Fatty liver.

## 2021-01-07 NOTE — Telephone Encounter (Signed)
Patient called in states she has had upper right quad pain since last week it is worse after she eats. She is requesting lab and ultra sound to rule out gallbladder. She requested an appt but we don't have any.  Please advise [phone]

## 2021-01-07 NOTE — ED Triage Notes (Signed)
RUQ pain since July 4th. Worse after eating nausea, diarrhea.

## 2021-01-07 NOTE — ED Notes (Signed)
Patient verbalizes understanding of discharge instructions. Opportunity for questioning and answers were provided. Armband removed by staff, pt discharged from ED ambulatory.   

## 2021-01-07 NOTE — ED Provider Notes (Signed)
[facility] EMERGENCY DEPARTMENT Provider Note   CSN: [id number] Arrival date & time: 01/07/21  2130     History Chief Complaint  Patient presents with   Abdominal Pain    Grace Hawkins is a 45 y.o. female.  Presents to ER with concern for right upper quadrant abdominal pain.  Has been having intermittent pain ever since July 4.  Comes and goes, worse after eating.  Had particularly bad episode of pain on Friday.  Has noted some loose stools.  No nausea or vomiting.  Pain is currently mild, 3 of 10 in severity, aching in nature.  Has tried taking Pepcid without relief of symptoms.  Denies past history of any gallbladder problems.  Has been recently following a new diet, has had 35 pound intentional weight loss.  Denies any chronic medical problems.  Had been diagnosed with diabetes couple years ago but more recent A1c is normal.   HPI     Past Medical History:  Diagnosis Date   Allergy    Elevated liver function tests 11/23/2013   Hypothyroid    Pyelonephritis 11/23/2013    Patient Active Problem List   Diagnosis Date Noted   Bulging lumbar disc 05/04/2020   Class 3 obesity 04/04/2019   Hyperlipidemia, mixed 09/16/2018   Type 2 diabetes mellitus without complications ([place]) 35/32/9924   Hypothyroid     No past surgical history on file.   OB History   No obstetric history on file.     No family history on file.  Social History   Tobacco Use   Smoking status: Never   Smokeless tobacco: Never  Substance Use Topics   Alcohol use: No   Drug use: No    Home Medications Prior to Admission medications   Medication Sig Start Date End Date Taking? Authorizing Provider  benzonatate (TESSALON PERLES) 100 MG capsule Take 1 capsule (100 mg total) by mouth 3 (three) times daily as needed. 12/16/20   Sharion Balloon, FNP  Blood Glucose Monitoring Suppl KIT Use to check blood sugar up to 3 times daily ICD 10: R73.09, Dispense based on insurance preference  09/16/18   Delsa Grana, PA-C  cetirizine (ZYRTEC) 10 MG tablet Take 1 tablet (10 mg total) by mouth daily as needed for allergies. 04/01/19   Alycia Rossetti, MD  COVID-19 At Home Antigen Test [facility] COVID-19 HOME TEST) KIT Use as directed within package instructions 12/24/20   Margie Ege, [facility]  cyclobenzaprine (FLEXERIL) 10 MG tablet TAKE 1 TABLET BY MOUTH 3 TIMES DAILY AS NEEDED FOR MUSCLE SPASMS. 09/07/20 09/07/21  Alycia Rossetti, MD  Dulaglutide 1.5 MG/0.5ML SOPN INJECT 1.5 MG INTO THE SKIN ONCE A WEEK. 09/07/20 09/07/21  Alycia Rossetti, MD  fluticasone [facility]) 50 MCG/ACT nasal spray Place 2 sprays into both nostrils daily. 12/16/20   Sharion Balloon, FNP  gabapentin (NEURONTIN) 300 MG capsule Take 1 tab TID prn 09/07/20   Alycia Rossetti, MD  gabapentin (NEURONTIN) 300 MG capsule TAKE 1 CAPSULE BY MOUTH AT BEDTIME FOR 3 DAYS, 1 CAPSULE 2 TIMES DAILY FOR 3 DAYS THEN 1 CAPSULE 3 TIMES DAILY 05/23/20 05/23/21  Cecilie Kicks, PA-C  glucose blood test strip USE TO CHECK BLOOD SUGAR UP TO 3 TIMES DAILY 05/04/20 05/04/21  Alycia Rossetti, MD  Lancets (FREESTYLE) lancets USE TO CHECK BLOOD SUGAR. 05/04/20 05/04/21  Alycia Rossetti, MD  Lancets Ultra Thin 30G MISC Use to check blood sugar. Dispense based on insurance preference  [id number] 05/04/20   Alycia Rossetti, MD  SYNTHROID 112 MCG tablet TAKE 2 TABLETS BY MOUTH DAILY. 10/04/20 10/04/21  Susy Frizzle, MD  LORazepam (ATIVAN) 0.5 MG tablet TAKE 1 TABLET BY MOUTH 2 TIMES DAILY AS NEEDED FOR ANXIETY. 09/07/20 03/06/21  Alycia Rossetti, MD  metFORMIN (GLUCOPHAGE) 1000 MG tablet TAKE 1 TABLET BY MOUTH 2 TIMES DAILY WITH A MEAL. 09/07/20 09/07/21  Alycia Rossetti, MD  Olopatadine HCl 0.2 % SOLN APPLY 1 DROP TO EYE DAILY AS NEEDED AS DIRECTED 09/07/20 09/07/21  Alycia Rossetti, MD  predniSONE (STERAPRED UNI-PAK 21 TAB) 5 MG (21) TBPK tablet TAKE AS DIRECTED PER PACKAGE 05/23/20 05/23/21  Lacie Draft M, PA-C  triamcinolone (KENALOG) 0.1  % APPLY 1 APPLICATION TOPICALLY 2 TIMES DAILY AS DIRECTED 09/07/20 09/07/21  Alycia Rossetti, MD    Allergies    Patient has no known allergies.  Review of Systems   Review of Systems  Constitutional:  Negative for chills and fever.  HENT:  Negative for ear pain and sore throat.   Eyes:  Negative for pain and visual disturbance.  Respiratory:  Negative for cough and shortness of breath.   Cardiovascular:  Negative for chest pain and palpitations.  Gastrointestinal:  Positive for abdominal pain and diarrhea. Negative for vomiting.  Genitourinary:  Negative for dysuria and hematuria.  Musculoskeletal:  Negative for arthralgias and back pain.  Skin:  Negative for color change and rash.  Neurological:  Negative for seizures and syncope.  All other systems reviewed and are negative.  Physical Exam Updated Vital Signs BP 131/75 (BP Location: Right Arm)   Pulse 76   Temp 98.4 F (36.9 C) (Oral)   Resp 18   SpO2 100%   Physical Exam Vitals and nursing note reviewed.  Constitutional:      General: She is not in acute distress.    Appearance: She is well-developed.  HENT:     Head: Normocephalic and atraumatic.  Eyes:     Conjunctiva/sclera: Conjunctivae normal.  Cardiovascular:     Rate and Rhythm: Normal rate and regular rhythm.  Pulmonary:     Effort: Pulmonary effort is normal. No respiratory distress.  Abdominal:     Palpations: Abdomen is soft.     Tenderness: There is abdominal tenderness.     Comments: Abdomen is soft, no rebound or guarding, some tenderness in the right upper quadrant and epigastric region.  Negative Murphy's.   Musculoskeletal:     Cervical back: Neck supple.  Skin:    General: Skin is warm and dry.  Neurological:     General: No focal deficit present.     Mental Status: She is alert.  Psychiatric:        Mood and Affect: Mood normal.        Behavior: Behavior normal.    ED Results / Procedures / Treatments   Labs (all labs ordered are  listed, but only abnormal results are displayed) Labs Reviewed  CBC WITH DIFFERENTIAL/PLATELET - Abnormal; Notable for the following components:      Result Value   WBC 12.3 (*)    Eosinophils Absolute 1.6 (*)    All other components within normal limits  COMPREHENSIVE METABOLIC PANEL - Abnormal; Notable for the following components:   Glucose, Bld 103 (*)    Albumin 3.4 (*)    All other components within normal limits  LIPASE, BLOOD  URINALYSIS, ROUTINE W REFLEX MICROSCOPIC    EKG None  Radiology US Abdomen  Limited  Result Date: 01/07/2021 CLINICAL DATA:  45 year old female with right upper quadrant abdominal pain, nausea vomiting. EXAM: ULTRASOUND ABDOMEN LIMITED RIGHT UPPER QUADRANT COMPARISON:  CT of the abdomen pelvis dated 10/22/2013. FINDINGS: Evaluation is limited due to body habitus. Gallbladder: No definite gallstone. The gallbladder wall appears slightly thickened measuring 5 mm. However, this may be related to over measurement. No pericholecystic fluid. Positive sonographic Murphy's sign reported. Common bile duct: Diameter: 5 mm Liver: There is diffuse increased liver echogenicity most commonly seen in the setting of fatty infiltration. Superimposed inflammation or fibrosis is not excluded. Clinical correlation is recommended. Portal vein is patent on color Doppler imaging with normal direction of blood flow towards the liver. Other: None. IMPRESSION: 1. No definite gallstone. A hepatobiliary scintigraphy may provide better evaluation of the gallbladder if there is a high clinical concern for acute cholecystitis . 2. Fatty liver. Electronically Signed   By: Anner Crete M.D.   On: 01/07/2021 22:34    Procedures Procedures   Medications Ordered in ED Medications - No data to display  ED Course  I have reviewed the triage vital signs and the nursing notes.  Pertinent labs & imaging results that were available during my care of the patient were reviewed by me and  considered in my medical decision making (see chart for details).    MDM Rules/Calculators/A&P                          45 year old lady presents to ER with concern for right upper quadrant pain.  Worse after eating.  On exam well-appearing, minimal ongoing symptoms.  Did have some mild right upper quadrant tenderness.  Basic labs noted for mild leukocytosis.  LFTs normal, lipase normal, UA negative.  Right upper quadrant ultrasound negative for any acute pathology.  No cholelithiasis.  No evidence for cholecystitis on current exam.  Given these ultrasound findings, no change in LFTs, very low suspicion for acute cholecystitis. Also consider gastritis or reflux as cause of symptoms though patient notably had tried Pepcid without any improvement in symptoms. Given exam, vitals, labs and imaging, believe she is appropriate for out pt management. Recommend follow-up with primary doctor for further management.     After the discussed management above, the patient was determined to be safe for discharge.  The patient was in agreement with this plan and all questions regarding their care were answered.  ED return precautions were discussed and the patient will return to the ED with any significant worsening of condition.  Final Clinical Impression(s) / ED Diagnoses Final diagnoses:  RUQ abdominal pain    Rx / DC Orders ED Discharge Orders     None        Lucrezia Starch, MD 01/07/21 2337

## 2021-01-07 NOTE — Discharge Instructions (Addendum)
Follow-up with primary doctor later this week.  If you have worsening abdominal pain, nausea, vomiting, fever or other new concerning symptom, please come back to ER for reassessment.

## 2021-01-08 NOTE — Telephone Encounter (Signed)
Pt went to ER last night.

## 2021-01-11 ENCOUNTER — Other Ambulatory Visit: Payer: Self-pay

## 2021-01-11 ENCOUNTER — Other Ambulatory Visit (HOSPITAL_BASED_OUTPATIENT_CLINIC_OR_DEPARTMENT_OTHER): Payer: Self-pay

## 2021-01-11 ENCOUNTER — Ambulatory Visit (INDEPENDENT_AMBULATORY_CARE_PROVIDER_SITE_OTHER): Payer: No Typology Code available for payment source | Admitting: Family Medicine

## 2021-01-11 ENCOUNTER — Encounter: Payer: Self-pay | Admitting: Family Medicine

## 2021-01-11 ENCOUNTER — Encounter: Payer: Self-pay | Admitting: *Deleted

## 2021-01-11 VITALS — BP 126/70 | HR 88 | Temp 98.2°F | Resp 16

## 2021-01-11 DIAGNOSIS — E039 Hypothyroidism, unspecified: Secondary | ICD-10-CM | POA: Diagnosis not present

## 2021-01-11 DIAGNOSIS — E119 Type 2 diabetes mellitus without complications: Secondary | ICD-10-CM

## 2021-01-11 DIAGNOSIS — E669 Obesity, unspecified: Secondary | ICD-10-CM

## 2021-01-11 DIAGNOSIS — Z794 Long term (current) use of insulin: Secondary | ICD-10-CM

## 2021-01-11 DIAGNOSIS — E782 Mixed hyperlipidemia: Secondary | ICD-10-CM

## 2021-01-11 DIAGNOSIS — R1011 Right upper quadrant pain: Secondary | ICD-10-CM

## 2021-01-11 MED ORDER — PANTOPRAZOLE SODIUM 40 MG PO TBEC
40.0000 mg | DELAYED_RELEASE_TABLET | Freq: Two times a day (BID) | ORAL | 3 refills | Status: DC
  Filled 2021-01-11: qty 60, 30d supply, fill #0
  Filled 2021-05-03: qty 60, 30d supply, fill #1
  Filled 2021-07-11: qty 60, 30d supply, fill #2
  Filled 2021-08-19: qty 60, 30d supply, fill #3

## 2021-01-11 MED ORDER — HYDROCODONE-ACETAMINOPHEN 5-325 MG PO TABS
1.0000 | ORAL_TABLET | Freq: Four times a day (QID) | ORAL | 0 refills | Status: DC | PRN
  Filled 2021-01-11: qty 30, 8d supply, fill #0

## 2021-01-11 NOTE — Progress Notes (Signed)
Subjective:    Patient ID: Grace Hawkins, female    DOB: [birth year], 45 y.o.   MRN: [id number]  HPI Patient is a very pleasant 45 year old female who is here today to establish care with me.  She was previously seen my partner who has since retired.  She has a history of type 2 diabetes mellitus currently on Trulicity.  However she states that she has not been taking the Trulicity over the last month.  She is been working aggressively on her diet and has lost considerable weight by switching to a low-carb diet.  She has been off the Trulicity now for more than a month and she is interested to see if she can discontinue the medicine altogether.  However over the last 10 to 11 days she has had almost daily right upper quadrant pain.  The pain became so bad that she went to the emergency room earlier this week.  A right upper quadrant ultrasound was negative.  The lab work obtained in the emergency room was unremarkable.  However she continues to have right upper quadrant pain brought on by food.  The pain is located right below her breast and she is tender to palpation today in the right upper quadrant directly over the gallbladder raising concern about possible biliary dyskinesia.  She has been taking Pepcid without any relief.  She denies any melena or hematochezia. Past Medical History:  Diagnosis Date   Allergy    Elevated liver function tests 11/23/2013   Hypothyroid    Pyelonephritis 11/23/2013   History reviewed. No pertinent surgical history. Current Outpatient Medications on File Prior to Visit  Medication Sig Dispense Refill   Blood Glucose Monitoring Suppl KIT Use to check blood sugar up to 3 times daily ICD 10: R73.09, Dispense based on insurance preference 1 each 0   cetirizine (ZYRTEC) 10 MG tablet Take 1 tablet (10 mg total) by mouth daily as needed for allergies. 30 tablet 11   cyclobenzaprine (FLEXERIL) 10 MG tablet TAKE 1 TABLET BY MOUTH 3 TIMES DAILY AS NEEDED FOR MUSCLE SPASMS.  20 tablet 0   glucose blood test strip USE TO CHECK BLOOD SUGAR UP TO 3 TIMES DAILY 100 strip 12   Lancets (FREESTYLE) lancets USE TO CHECK BLOOD SUGAR. 100 each 5   SYNTHROID 112 MCG tablet TAKE 2 TABLETS BY MOUTH DAILY. 180 tablet 0   No current facility-administered medications on file prior to visit.   Marland Kitchenall Social History   Socioeconomic History   Marital status: Married    Spouse name: Not on file   Number of children: Not on file   Years of education: Not on file   Highest education level: Not on file  Occupational History   Not on file  Tobacco Use   Smoking status: Never   Smokeless tobacco: Never  Substance and Sexual Activity   Alcohol use: No   Drug use: No   Sexual activity: Not on file  Other Topics Concern   Not on file  Social History Narrative   Not on file   Social Determinants of Health   Financial Resource Strain: Not on file  Food Insecurity: Not on file  Transportation Needs: Not on file  Physical Activity: Not on file  Stress: Not on file  Social Connections: Not on file  Intimate Partner Violence: Not on file      Review of Systems     Objective:   Physical Exam Vitals reviewed.  Constitutional:  General: She is not in acute distress.    Appearance: Normal appearance. She is obese. She is not ill-appearing or toxic-appearing.  Cardiovascular:     Rate and Rhythm: Normal rate and regular rhythm.     Heart sounds: Normal heart sounds. No murmur heard.   No friction rub. No gallop.  Pulmonary:     Effort: Pulmonary effort is normal. No respiratory distress.     Breath sounds: Normal breath sounds. No stridor. No wheezing, rhonchi or rales.  Chest:     Chest wall: No tenderness.  Abdominal:     General: Abdomen is flat. There is no distension.     Palpations: Abdomen is soft.     Tenderness: There is abdominal tenderness. There is no guarding or rebound.    Neurological:     Mental Status: She is alert.           Assessment & Plan:  Type 2 diabetes mellitus without complication, with long-term current use of insulin (HCC) - Plan: Hemoglobin A1c, COMPLETE METABOLIC PANEL WITH GFR, CBC with Differential/Platelet, Lipid panel, TSH, Microalbumin, urine  Hypothyroidism, unspecified type - Plan: Hemoglobin A1c, COMPLETE METABOLIC PANEL WITH GFR, CBC with Differential/Platelet, Lipid panel, TSH, Microalbumin, urine  Class 3 obesity  Hyperlipidemia, mixed  RUQ pain - Plan: NM Hepato W/Eject Fract Pain certainly sounds to be biliary in origin.  Therefore I will schedule the patient for a HIDA scan.  Meanwhile I will start her on Protonix 40 mg twice daily in case this is duodenal ulcer.  If HIDA scan is unremarkable, the next step would be either a CT scan of the abdomen and pelvis, or an EGD.  Meanwhile check A1c, CMP, lipid panel.  Goal LDL cholesterol is less than 100.  Goal A1c is less than 6.5.  Patient has a history of hypothyroidism and given the recent significant weight loss I will check a TSH to ensure adequate dosage of levothyroxine.

## 2021-01-12 LAB — CBC WITH DIFFERENTIAL/PLATELET
Absolute Monocytes: 514 cells/uL (ref 200–950)
Basophils Absolute: 63 cells/uL (ref 0–200)
Basophils Relative: 0.8 %
Eosinophils Absolute: 885 cells/uL — ABNORMAL HIGH (ref 15–500)
Eosinophils Relative: 11.2 %
HCT: 39.2 % (ref 35.0–45.0)
Hemoglobin: 12.3 g/dL (ref 11.7–15.5)
Lymphs Abs: 1920 cells/uL (ref 850–3900)
MCH: 27.2 pg (ref 27.0–33.0)
MCHC: 31.4 g/dL — ABNORMAL LOW (ref 32.0–36.0)
MCV: 86.5 fL (ref 80.0–100.0)
MPV: 12.2 fL (ref 7.5–12.5)
Monocytes Relative: 6.5 %
Neutro Abs: 4519 cells/uL (ref 1500–7800)
Neutrophils Relative %: 57.2 %
Platelets: 234 10*3/uL (ref 140–400)
RBC: 4.53 10*6/uL (ref 3.80–5.10)
RDW: 14.3 % (ref 11.0–15.0)
Total Lymphocyte: 24.3 %
WBC: 7.9 10*3/uL (ref 3.8–10.8)

## 2021-01-12 LAB — COMPLETE METABOLIC PANEL WITH GFR
AG Ratio: 1.4 (calc) (ref 1.0–2.5)
ALT: 17 U/L (ref 6–29)
AST: 19 U/L (ref 10–30)
Albumin: 3.7 g/dL (ref 3.6–5.1)
Alkaline phosphatase (APISO): 44 U/L (ref 31–125)
BUN: 14 mg/dL (ref 7–25)
CO2: 23 mmol/L (ref 20–32)
Calcium: 9 mg/dL (ref 8.6–10.2)
Chloride: 107 mmol/L (ref 98–110)
Creat: 0.84 mg/dL (ref 0.50–0.99)
Globulin: 2.7 g/dL (calc) (ref 1.9–3.7)
Glucose, Bld: 105 mg/dL — ABNORMAL HIGH (ref 65–99)
Potassium: 4.2 mmol/L (ref 3.5–5.3)
Sodium: 139 mmol/L (ref 135–146)
Total Bilirubin: 0.3 mg/dL (ref 0.2–1.2)
Total Protein: 6.4 g/dL (ref 6.1–8.1)
eGFR: 89 mL/min/{1.73_m2} (ref >=60)

## 2021-01-12 LAB — MICROALBUMIN, URINE: Microalb, Ur: 0.5 mg/dL

## 2021-01-12 LAB — LIPID PANEL
Cholesterol: 163 mg/dL (ref <200)
HDL: 47 mg/dL — ABNORMAL LOW (ref >=50)
LDL Cholesterol (Calc): 87 mg/dL (calc)
Non-HDL Cholesterol (Calc): 116 mg/dL (calc) (ref <130)
Total CHOL/HDL Ratio: 3.5 (calc) (ref <5.0)
Triglycerides: 195 mg/dL — ABNORMAL HIGH (ref <150)

## 2021-01-12 LAB — TSH: TSH: 8.47 mIU/L — ABNORMAL HIGH

## 2021-01-12 LAB — HEMOGLOBIN A1C
Hgb A1c MFr Bld: 5.1 % of total Hgb (ref <5.7)
Mean Plasma Glucose: 100 mg/dL
eAG (mmol/L): 5.5 mmol/L

## 2021-01-15 ENCOUNTER — Other Ambulatory Visit: Payer: Self-pay | Admitting: *Deleted

## 2021-01-15 ENCOUNTER — Other Ambulatory Visit (HOSPITAL_BASED_OUTPATIENT_CLINIC_OR_DEPARTMENT_OTHER): Payer: Self-pay

## 2021-01-15 DIAGNOSIS — E039 Hypothyroidism, unspecified: Secondary | ICD-10-CM

## 2021-01-15 MED ORDER — SYNTHROID 125 MCG PO TABS
250.0000 ug | ORAL_TABLET | Freq: Every day | ORAL | 1 refills | Status: DC
  Filled 2021-01-15: qty 60, 30d supply, fill #0
  Filled 2021-03-05: qty 60, 30d supply, fill #1

## 2021-01-17 ENCOUNTER — Other Ambulatory Visit: Payer: Self-pay | Admitting: *Deleted

## 2021-01-17 NOTE — Patient Outreach (Signed)
[facility]) Care Management  01/17/2021  Grace Hawkins [facility] 10-22-1975 [id number]   Transition of care telephone call  Referral received: 01/17/2021 Initial outreach: 01/17/2021 Surgery/procedure date: 01/14/2021 Insurance: [employer]    Initial unsuccessful telephone call to patient's preferred number in order to complete transition of care assessment; no answer, left HIPAA compliant voicemail message requesting return call.   Objective: Per the electronic medical record, Mrs was hospitalized at [facility].   Plan: If no return call from patient by the end of business day today, this RNCM will route unsuccessful outreach letter with [facility] Care Management pamphlet and 24 hour Nurse Advice Line Magnet to [facility] Care Management clinical pool to be mailed to patient's home address. This RNCM will attempt another outreach within 4 business days.  Will update Ander Purpura, RN concerning ongoing follow up call for this pt.  Elliot Cousin, RN Care Management Coordinator [facility] Main Office [phone]

## 2021-01-18 ENCOUNTER — Other Ambulatory Visit: Payer: Self-pay | Admitting: *Deleted

## 2021-01-18 NOTE — Patient Outreach (Signed)
[facility]) Care Management  01/18/2021  Cressida Milford [facility] 1976-05-16 [id number]  Transition of Care/ED visit on 7/18  Spoke with pt who indicated she just had a ED visit for 1 1/2 hr with no additional follow up needed. Pt was not admitted and continues to due well with no needs at this time.  No transition of care calls required for this pt.   Elliot Cousin, RN Care Management Coordinator [facility] Main Office [phone]

## 2021-01-21 ENCOUNTER — Other Ambulatory Visit (HOSPITAL_BASED_OUTPATIENT_CLINIC_OR_DEPARTMENT_OTHER): Payer: Self-pay

## 2021-01-22 ENCOUNTER — Encounter: Payer: Self-pay | Admitting: Family Medicine

## 2021-02-04 ENCOUNTER — Other Ambulatory Visit: Payer: Self-pay

## 2021-02-04 ENCOUNTER — Ambulatory Visit (HOSPITAL_COMMUNITY)
Admission: RE | Admit: 2021-02-04 | Discharge: 2021-02-04 | Disposition: A | Discharge status: Home / Self Care | Payer: No Typology Code available for payment source | Source: Ambulatory Visit | Attending: Family Medicine | Admitting: Family Medicine

## 2021-02-04 DIAGNOSIS — R1011 Right upper quadrant pain: Secondary | ICD-10-CM | POA: Diagnosis present

## 2021-02-04 IMAGING — NM NM HEPATO W/GB/PHARM/[PERSON_NAME]
2 series · 12 of 12 positions shown · non-contrast
Comparison: [DATE]

CLINICAL DATA: Right upper quadrant pain and nausea for 3 months

EXAM:
NUCLEAR MEDICINE HEPATOBILIARY IMAGING WITH GALLBLADDER EF
TECHNIQUE: Sequential images of the abdomen were obtained [DATE] minutes
following intravenous administration of radiopharmaceutical. After
oral ingestion of Ensure, gallbladder ejection fraction was
determined. At 60 min, normal ejection fraction is greater than 33%.
RADIOPHARMACEUTICALS:  5.25 mCi [MH]  Choletec IV

[he hepatobiliary · 4.52mm/px · 6 of 60 frames shown (1 of 2)]
[frame 6/60]
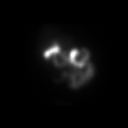
[frame 16/60]
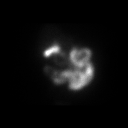
[frame 26/60]
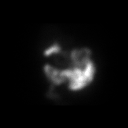
[frame 36/60]
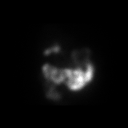
[frame 46/60]
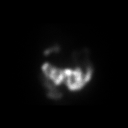
[frame 56/60]
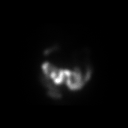

[he hepatobiliary · 4.52mm/px · 6 of 60 frames shown (2 of 2)]
[frame 6/60]
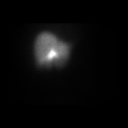
[frame 16/60]
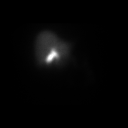
[frame 26/60]
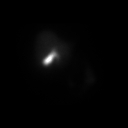
[frame 36/60]
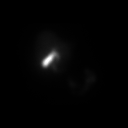
[frame 46/60]
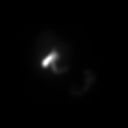
[frame 56/60]
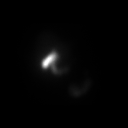

[12 of 12 positions shown; findings below may reference images not displayed]

FINDINGS: Prompt uptake and biliary excretion of activity by the liver is
seen. Gallbladder activity is visualized, consistent with patency of
cystic duct. Biliary activity passes into small bowel, consistent
with patent common bile duct.

Calculated gallbladder ejection fraction is 84%. (Normal gallbladder
ejection fraction with Ensure is greater than 33%.)
IMPRESSION: 1. Normal hepatobiliary scan.  Normal ejection fraction.

## 2021-02-04 MED ORDER — TECHNETIUM TC 99M MEBROFENIN IV KIT
5.0000 | PACK | Freq: Once | INTRAVENOUS | Status: AC | PRN
  Administered 2021-02-04: 5 via INTRAVENOUS

## 2021-03-05 ENCOUNTER — Other Ambulatory Visit (HOSPITAL_BASED_OUTPATIENT_CLINIC_OR_DEPARTMENT_OTHER): Payer: Self-pay

## 2021-05-03 ENCOUNTER — Other Ambulatory Visit (HOSPITAL_BASED_OUTPATIENT_CLINIC_OR_DEPARTMENT_OTHER): Payer: Self-pay

## 2021-05-03 ENCOUNTER — Telehealth: Payer: No Typology Code available for payment source | Admitting: Emergency Medicine

## 2021-05-03 DIAGNOSIS — J019 Acute sinusitis, unspecified: Secondary | ICD-10-CM | POA: Diagnosis not present

## 2021-05-03 DIAGNOSIS — B9689 Other specified bacterial agents as the cause of diseases classified elsewhere: Secondary | ICD-10-CM | POA: Diagnosis not present

## 2021-05-03 MED ORDER — AMOXICILLIN-POT CLAVULANATE 875-125 MG PO TABS
1.0000 | ORAL_TABLET | Freq: Two times a day (BID) | ORAL | 0 refills | Status: DC
  Filled 2021-05-03: qty 14, 7d supply, fill #0

## 2021-05-03 MED FILL — Glucose Blood Test Strip: 30 days supply | Qty: 100 | Fill #1 | Status: AC

## 2021-05-03 MED FILL — Lancets: 90 days supply | Qty: 100 | Fill #0 | Status: AC

## 2021-05-03 NOTE — Progress Notes (Signed)

## 2021-05-10 ENCOUNTER — Encounter: Payer: Self-pay | Admitting: Family Medicine

## 2021-05-10 ENCOUNTER — Other Ambulatory Visit: Payer: Self-pay

## 2021-05-10 ENCOUNTER — Ambulatory Visit (INDEPENDENT_AMBULATORY_CARE_PROVIDER_SITE_OTHER): Payer: No Typology Code available for payment source | Admitting: Family Medicine

## 2021-05-10 ENCOUNTER — Other Ambulatory Visit (HOSPITAL_BASED_OUTPATIENT_CLINIC_OR_DEPARTMENT_OTHER): Payer: Self-pay

## 2021-05-10 VITALS — BP 126/80 | HR 80 | Temp 97.9°F | Resp 16

## 2021-05-10 DIAGNOSIS — E039 Hypothyroidism, unspecified: Secondary | ICD-10-CM | POA: Diagnosis not present

## 2021-05-10 DIAGNOSIS — Z794 Long term (current) use of insulin: Secondary | ICD-10-CM | POA: Diagnosis not present

## 2021-05-10 DIAGNOSIS — E782 Mixed hyperlipidemia: Secondary | ICD-10-CM

## 2021-05-10 DIAGNOSIS — E119 Type 2 diabetes mellitus without complications: Secondary | ICD-10-CM

## 2021-05-10 DIAGNOSIS — M25511 Pain in right shoulder: Secondary | ICD-10-CM

## 2021-05-10 MED ORDER — SEMAGLUTIDE (1 MG/DOSE) 4 MG/3ML ~~LOC~~ SOPN
1.0000 mg | PEN_INJECTOR | SUBCUTANEOUS | 11 refills | Status: DC
  Filled 2021-05-10: qty 3, 28d supply, fill #0

## 2021-05-10 NOTE — Progress Notes (Signed)
Subjective:    Patient ID: Grace Hawkins, female    DOB: 1976-05-19, 45 y.o.   MRN: [id number]  HPI Patient is a very pleasant 45 year old female who is here today to follow-up her diabetes.  She is currently on Trulicity 1.5 mg subcu weekly.  Her previous A1c was greater than 11 however it has been well controlled with weight loss.  However she feels that she has hit a wall with the Trulicity and is no longer experiencing weight loss.  She is interested in possibly switching to Ozempic due to its reported better efficacy.  She denies any nausea or vomiting on Trulicity.  She does complain of pain in her right shoulder with abduction greater than 90 degrees.  She also states that it hurts whenever she sleeps on it.  She has a difficult time raising her arm above her head.  This is been gradual in onset with no specific injury. Past Medical History:  Diagnosis Date   Allergy    Elevated liver function tests 11/23/2013   Hypothyroid    Pyelonephritis 11/23/2013   No past surgical history on file. Current Outpatient Medications on File Prior to Visit  Medication Sig Dispense Refill   amoxicillin-clavulanate (AUGMENTIN) 875-125 MG tablet Take 1 tablet by mouth 2 (two) times daily. 14 tablet 0   Blood Glucose Monitoring Suppl KIT Use to check blood sugar up to 3 times daily ICD 10: R73.09, Dispense based on insurance preference 1 each 0   cetirizine (ZYRTEC) 10 MG tablet Take 1 tablet (10 mg total) by mouth daily as needed for allergies. 30 tablet 11   cyclobenzaprine (FLEXERIL) 10 MG tablet TAKE 1 TABLET BY MOUTH 3 TIMES DAILY AS NEEDED FOR MUSCLE SPASMS. 20 tablet 0   glucose blood test strip USE TO CHECK BLOOD SUGAR UP TO 3 TIMES DAILY 100 strip 12   HYDROcodone-acetaminophen (NORCO) 5-325 MG tablet Take 1 tablet by mouth every 6 (six) hours as needed for moderate pain. 30 tablet 0   Lancets (FREESTYLE) lancets USE TO CHECK BLOOD SUGAR. 100 each 5   pantoprazole (PROTONIX) 40 MG tablet Take 1  tablet (40 mg total) by mouth 2 (two) times daily. 60 tablet 3   SYNTHROID 125 MCG tablet Take 2 tablets (250 mcg total) by mouth daily before breakfast. 60 tablet 1   No current facility-administered medications on file prior to visit.   Marland Kitchenall Social History   Socioeconomic History   Marital status: Married    Spouse name: Not on file   Number of children: Not on file   Years of education: Not on file   Highest education level: Not on file  Occupational History   Not on file  Tobacco Use   Smoking status: Never   Smokeless tobacco: Never  Substance and Sexual Activity   Alcohol use: No   Drug use: No   Sexual activity: Not on file  Other Topics Concern   Not on file  Social History Narrative   Not on file   Social Determinants of Health   Financial Resource Strain: Not on file  Food Insecurity: Not on file  Transportation Needs: Not on file  Physical Activity: Not on file  Stress: Not on file  Social Connections: Not on file  Intimate Partner Violence: Not on file      Review of Systems     Objective:   Physical Exam Vitals reviewed.  Constitutional:      General: She is not in acute distress.  Appearance: Normal appearance. She is obese. She is not ill-appearing or toxic-appearing.  Cardiovascular:     Rate and Rhythm: Normal rate and regular rhythm.     Heart sounds: Normal heart sounds. No murmur heard.   No friction rub. No gallop.  Pulmonary:     Effort: Pulmonary effort is normal. No respiratory distress.     Breath sounds: Normal breath sounds. No stridor. No wheezing, rhonchi or rales.  Chest:     Chest wall: No tenderness.  Abdominal:     General: Abdomen is flat. There is no distension.     Palpations: Abdomen is soft.     Tenderness: There is no guarding or rebound.  Musculoskeletal:     Left shoulder: Tenderness present. No swelling, effusion or bony tenderness. Decreased range of motion.  Neurological:     Mental Status: She is alert.           Assessment & Plan:  Type 2 diabetes mellitus without complication, with long-term current use of insulin ([place]) - Plan: CBC with Differential/Platelet, COMPLETE METABOLIC PANEL WITH GFR, Lipid panel, Hemoglobin A1c  Hypothyroidism, unspecified type - Plan: TSH  Hyperlipidemia, mixed  Class 3 obesity (HCC)  Acute pain of right shoulder Blood pressure today is excellent.  I will discontinue Trulicity and switch the patient to Ozempic 1 mg weekly.  if she tolerates this dose, in 1 month I would increase to 2 mg weekly.  Check CBC, CMP, fasting lipid panel, and A1c.  Goal A1c is less than 6.5.  Check TSH to ensure adequate dosage of levothyroxine.  I believe the pain in her right shoulder is due to impingement syndrome.  Using sterile technique, I injected the right subacromial space with 2 cc lidocaine, 2 cc of Marcaine, 2 cc of 40 mg/mL Kenalog.  The patient tolerated the procedure well without any complications.

## 2021-05-11 LAB — COMPLETE METABOLIC PANEL WITH GFR
AG Ratio: 1.5 (calc) (ref 1.0–2.5)
ALT: 15 U/L (ref 6–29)
AST: 15 U/L (ref 10–30)
Albumin: 4.1 g/dL (ref 3.6–5.1)
Alkaline phosphatase (APISO): 51 U/L (ref 31–125)
BUN: 19 mg/dL (ref 7–25)
CO2: 24 mmol/L (ref 20–32)
Calcium: 9.2 mg/dL (ref 8.6–10.2)
Chloride: 105 mmol/L (ref 98–110)
Creat: 0.76 mg/dL (ref 0.50–0.99)
Globulin: 2.8 g/dL (calc) (ref 1.9–3.7)
Glucose, Bld: 94 mg/dL (ref 65–99)
Potassium: 4.1 mmol/L (ref 3.5–5.3)
Sodium: 139 mmol/L (ref 135–146)
Total Bilirubin: 0.4 mg/dL (ref 0.2–1.2)
Total Protein: 6.9 g/dL (ref 6.1–8.1)
eGFR: 100 mL/min/{1.73_m2} (ref >=60)

## 2021-05-11 LAB — LIPID PANEL
Cholesterol: 196 mg/dL (ref <200)
HDL: 57 mg/dL (ref >=50)
LDL Cholesterol (Calc): 116 mg/dL (calc) — ABNORMAL HIGH
Non-HDL Cholesterol (Calc): 139 mg/dL (calc) — ABNORMAL HIGH (ref <130)
Total CHOL/HDL Ratio: 3.4 (calc) (ref <5.0)
Triglycerides: 118 mg/dL (ref <150)

## 2021-05-11 LAB — CBC WITH DIFFERENTIAL/PLATELET
Absolute Monocytes: 476 cells/uL (ref 200–950)
Basophils Absolute: 47 cells/uL (ref 0–200)
Basophils Relative: 0.6 %
Eosinophils Absolute: 140 cells/uL (ref 15–500)
Eosinophils Relative: 1.8 %
HCT: 40.6 % (ref 35.0–45.0)
Hemoglobin: 12.9 g/dL (ref 11.7–15.5)
Lymphs Abs: 2083 cells/uL (ref 850–3900)
MCH: 27.4 pg (ref 27.0–33.0)
MCHC: 31.8 g/dL — ABNORMAL LOW (ref 32.0–36.0)
MCV: 86.4 fL (ref 80.0–100.0)
MPV: 11.9 fL (ref 7.5–12.5)
Monocytes Relative: 6.1 %
Neutro Abs: 5054 cells/uL (ref 1500–7800)
Neutrophils Relative %: 64.8 %
Platelets: 271 10*3/uL (ref 140–400)
RBC: 4.7 10*6/uL (ref 3.80–5.10)
RDW: 13.8 % (ref 11.0–15.0)
Total Lymphocyte: 26.7 %
WBC: 7.8 10*3/uL (ref 3.8–10.8)

## 2021-05-11 LAB — TSH: TSH: 4.06 mIU/L

## 2021-05-11 LAB — HEMOGLOBIN A1C
Hgb A1c MFr Bld: 5 % of total Hgb (ref <5.7)
Mean Plasma Glucose: 97 mg/dL
eAG (mmol/L): 5.4 mmol/L

## 2021-05-13 ENCOUNTER — Other Ambulatory Visit (HOSPITAL_BASED_OUTPATIENT_CLINIC_OR_DEPARTMENT_OTHER): Payer: Self-pay

## 2021-05-13 ENCOUNTER — Other Ambulatory Visit: Payer: Self-pay | Admitting: Family Medicine

## 2021-05-13 ENCOUNTER — Encounter: Payer: Self-pay | Admitting: Family Medicine

## 2021-05-13 MED ORDER — SYNTHROID 125 MCG PO TABS
250.0000 ug | ORAL_TABLET | Freq: Every day | ORAL | 1 refills | Status: DC
  Filled 2021-05-13: qty 60, 30d supply, fill #0
  Filled 2021-07-11: qty 60, 30d supply, fill #1

## 2021-06-03 ENCOUNTER — Other Ambulatory Visit (HOSPITAL_BASED_OUTPATIENT_CLINIC_OR_DEPARTMENT_OTHER): Payer: Self-pay

## 2021-06-03 ENCOUNTER — Other Ambulatory Visit: Payer: Self-pay

## 2021-06-03 ENCOUNTER — Encounter: Payer: Self-pay | Admitting: Family Medicine

## 2021-06-03 DIAGNOSIS — Z794 Long term (current) use of insulin: Secondary | ICD-10-CM

## 2021-06-03 MED ORDER — SEMAGLUTIDE (2 MG/DOSE) 8 MG/3ML ~~LOC~~ SOPN
2.0000 mg | PEN_INJECTOR | SUBCUTANEOUS | 3 refills | Status: DC
  Filled 2021-06-03: qty 3, 28d supply, fill #0
  Filled 2021-07-11: qty 9, 84d supply, fill #1

## 2021-06-03 NOTE — Telephone Encounter (Signed)
OK to increase rx? Thanks!

## 2021-06-06 ENCOUNTER — Other Ambulatory Visit: Payer: Self-pay

## 2021-06-06 ENCOUNTER — Ambulatory Visit: Payer: Self-pay

## 2021-06-06 ENCOUNTER — Other Ambulatory Visit: Payer: Self-pay | Admitting: Family Medicine

## 2021-06-06 DIAGNOSIS — M25511 Pain in right shoulder: Secondary | ICD-10-CM

## 2021-06-06 IMAGING — DX DG SHOULDER 2+V*R*
3 series · 3 of 3 positions shown · non-contrast
Comparison: None.

CLINICAL DATA: Right shoulder pain.

EXAM:
RIGHT SHOULDER - 2+ VIEW

[shoulder grashey]
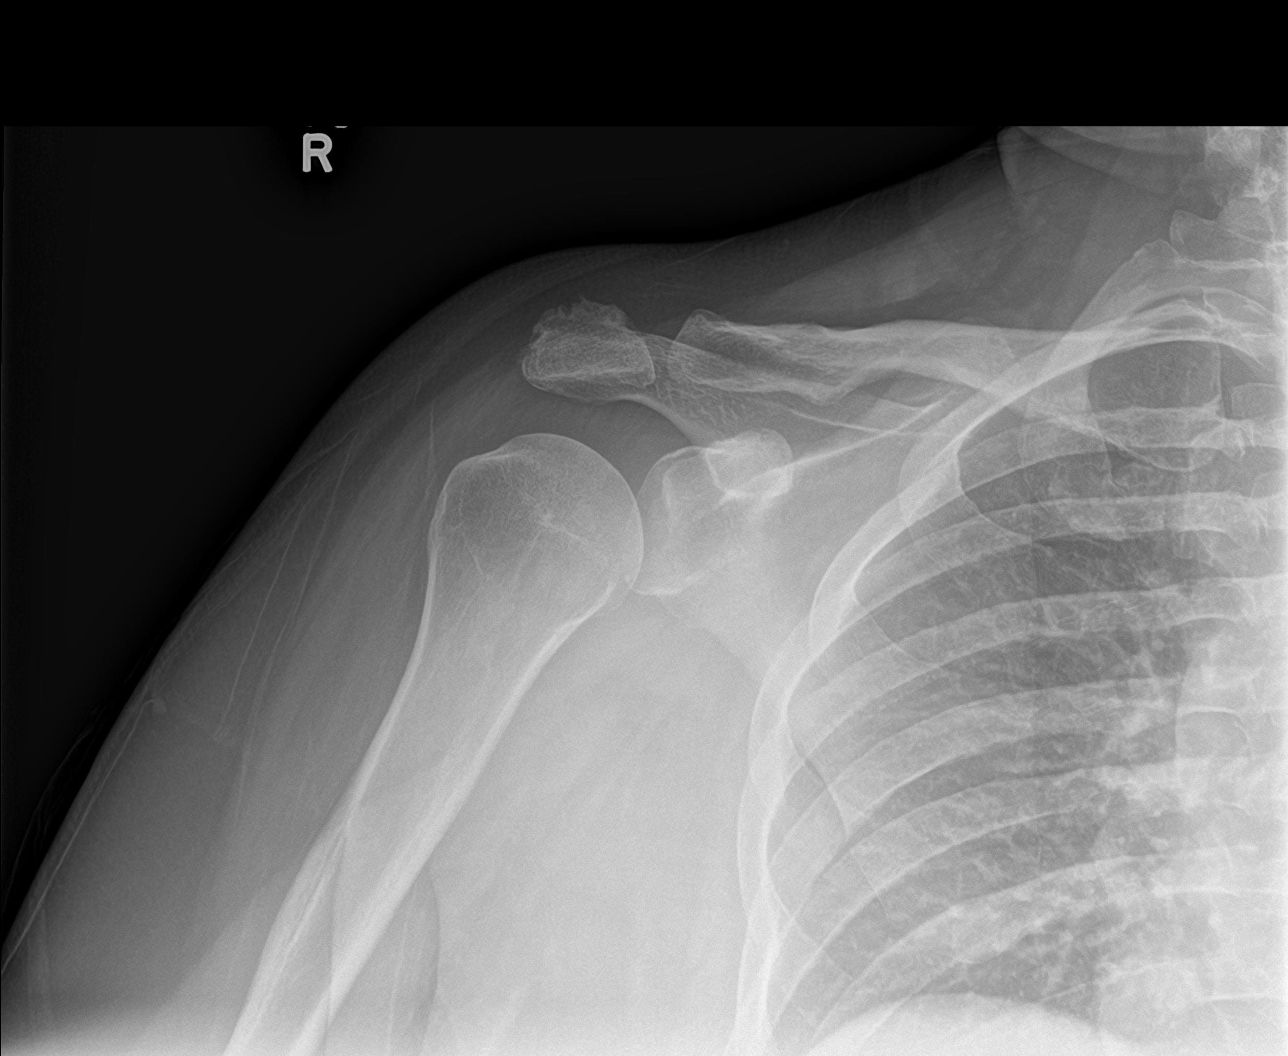

[shoulder axial]
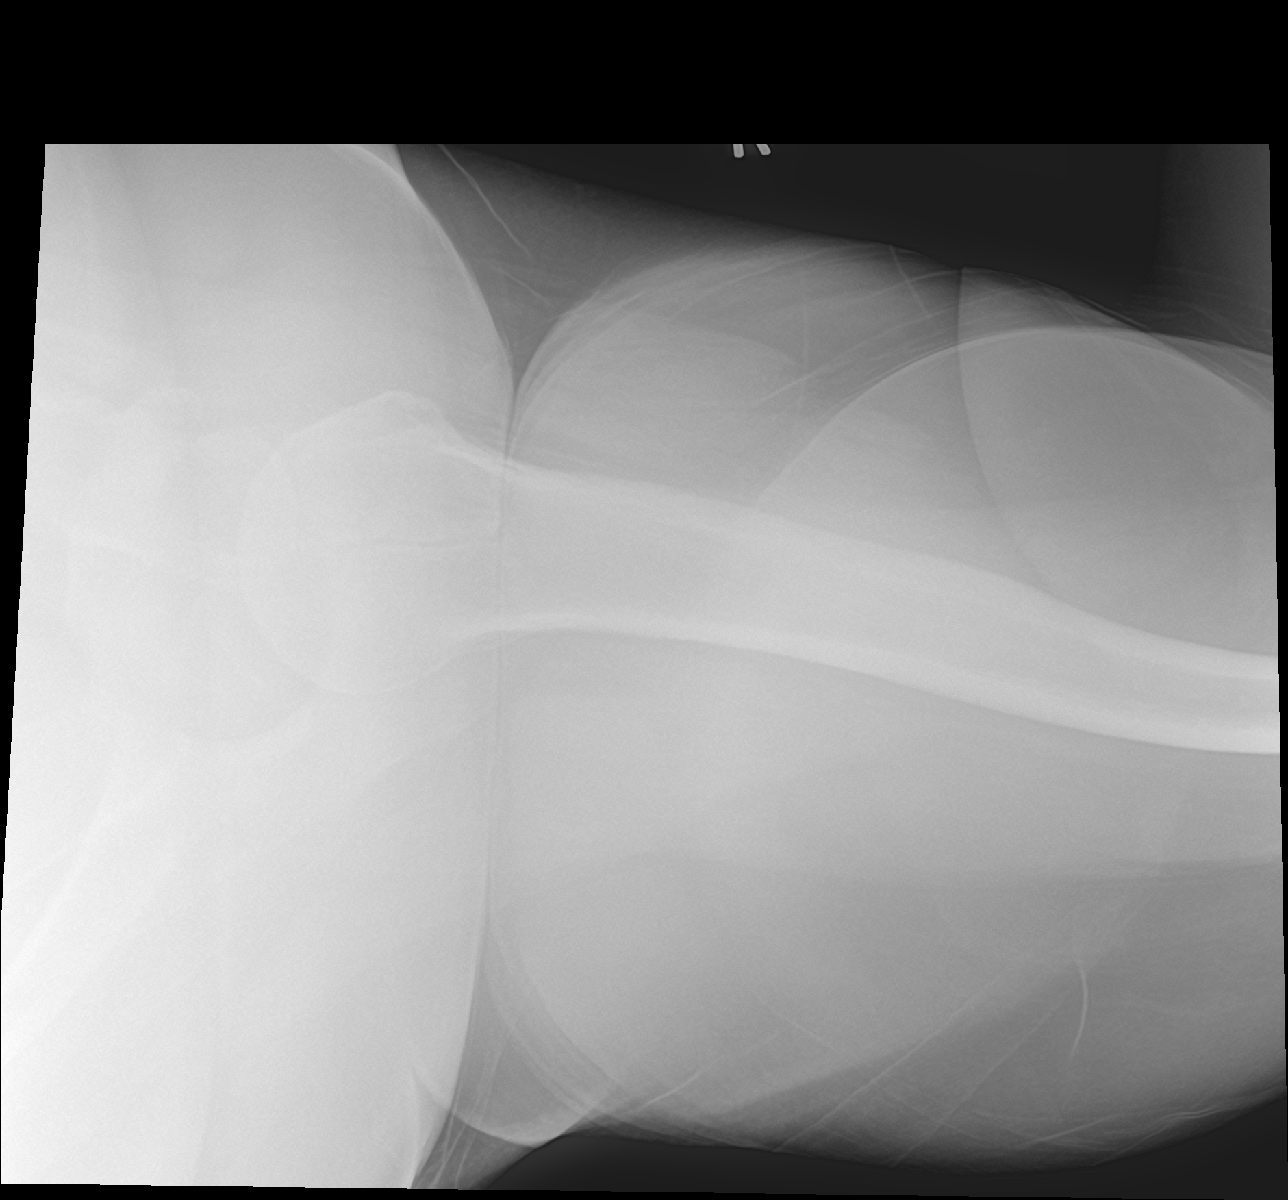

[shoulder y-view]
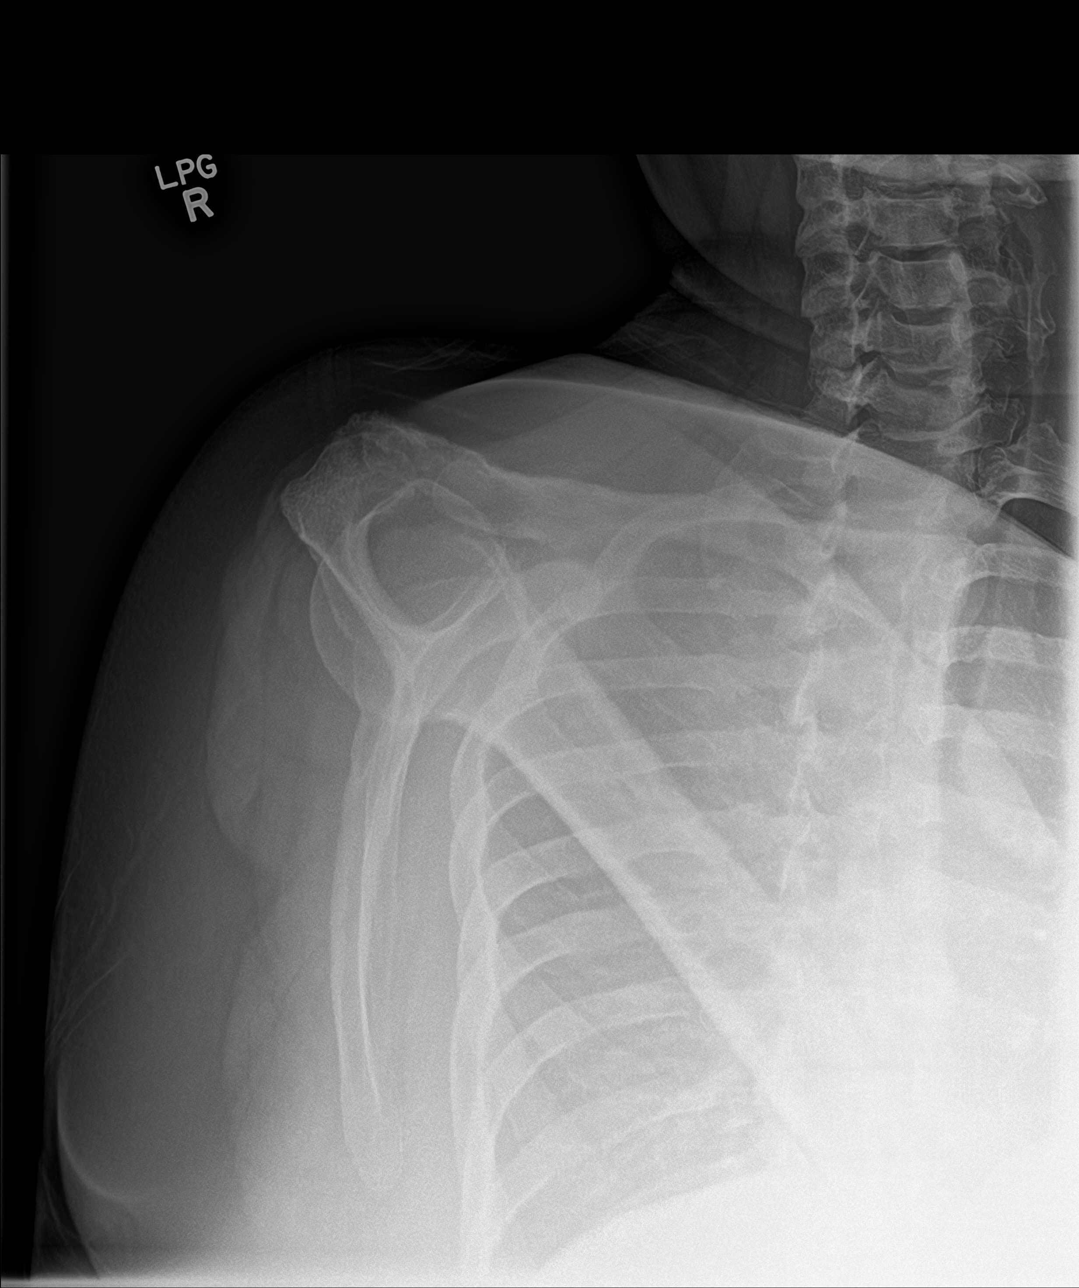

[3 of 3 positions shown; findings below may reference images not displayed]

FINDINGS: There is no evidence of fracture or dislocation. There is no
evidence of arthropathy or other focal bone abnormality. Soft
tissues are unremarkable.
IMPRESSION: Negative.

## 2021-06-13 ENCOUNTER — Other Ambulatory Visit (HOSPITAL_BASED_OUTPATIENT_CLINIC_OR_DEPARTMENT_OTHER): Payer: Self-pay

## 2021-06-14 ENCOUNTER — Other Ambulatory Visit: Payer: Self-pay | Admitting: Orthopedic Surgery

## 2021-06-14 ENCOUNTER — Other Ambulatory Visit (HOSPITAL_COMMUNITY): Payer: Self-pay | Admitting: Orthopedic Surgery

## 2021-06-14 DIAGNOSIS — M25511 Pain in right shoulder: Secondary | ICD-10-CM

## 2021-06-25 ENCOUNTER — Ambulatory Visit (HOSPITAL_COMMUNITY)
Admission: RE | Admit: 2021-06-25 | Discharge: 2021-06-25 | Disposition: A | Discharge status: Home / Self Care | Payer: PRIVATE HEALTH INSURANCE | Source: Ambulatory Visit | Attending: Orthopedic Surgery | Admitting: Orthopedic Surgery

## 2021-06-25 ENCOUNTER — Other Ambulatory Visit: Payer: Self-pay

## 2021-06-25 DIAGNOSIS — M25511 Pain in right shoulder: Secondary | ICD-10-CM | POA: Diagnosis present

## 2021-06-25 IMAGING — MR MR SHOULDER*R* WO/W CM
4 of 8 series · 19 of 40 positions shown · IV contrast (gadavist)
Comparison: X-ray shoulder [DATE].

CLINICAL DATA: Right shoulder pain related to attempting to move a
patient 3 months ago. Clinical concern for rotator cuff tear.

EXAM:
MRI OF THE RIGHT SHOULDER WITHOUT AND WITH CONTRAST
TECHNIQUE: Multiplanar, multisequence MR imaging of the right shoulder was
performed before and after the administration of intravenous
contrast.
CONTRAST:  10mL GADAVIST GADOBUTROL 1 MMOL/ML IV SOLN

[Series 2: T2 fat-sat · axial · 4.0mm · 0.35mm/px · z∈[-68,+59]mm · 6 of 28 slices shown (1 of 3)]
[im 1/28]
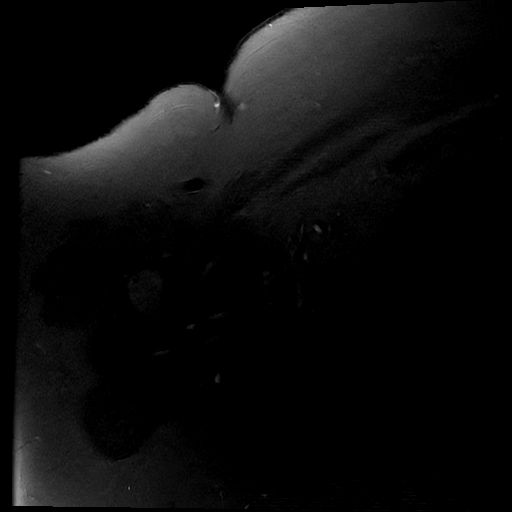
[im 6/28]
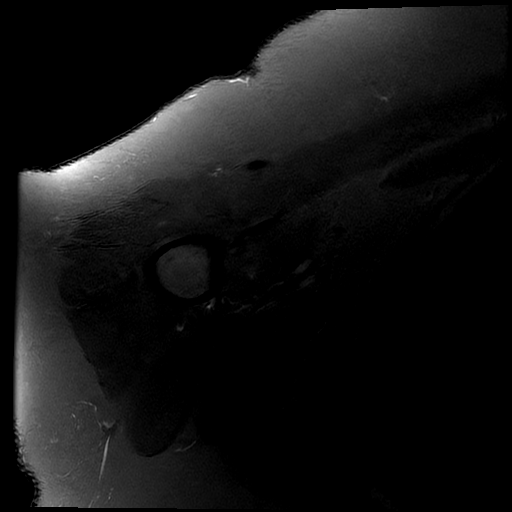
[im 11/28]
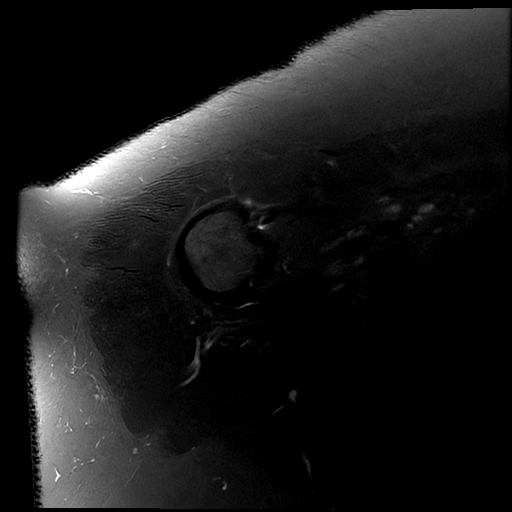
[im 17/28]
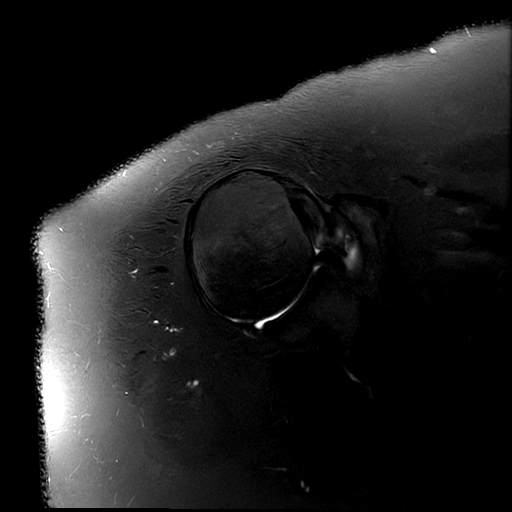
[im 22/28]
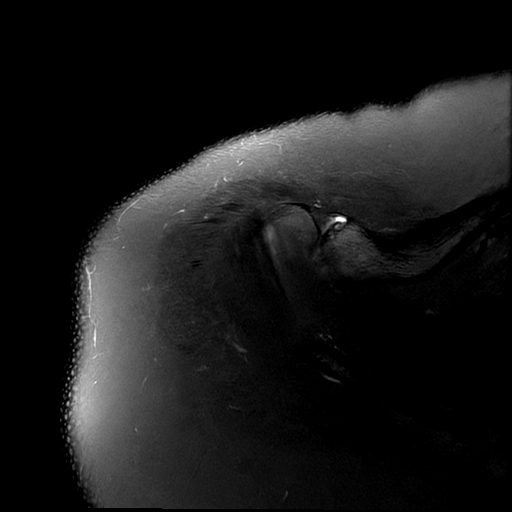
[im 28/28]
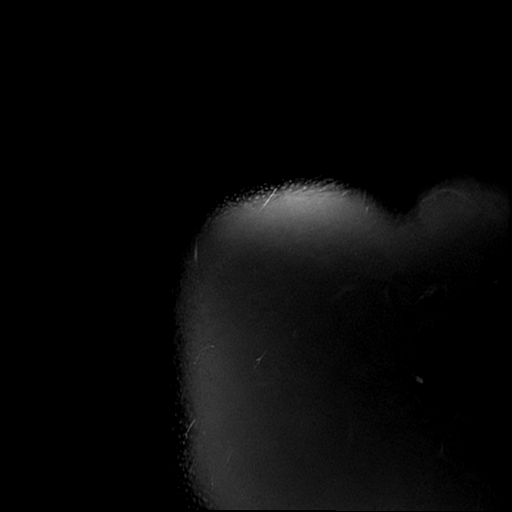

[Series 3: T2 fat-sat · oblique · 4.0mm · 0.35mm/px · 4 of 21 slices shown (2 of 3)]
[im 1/21]
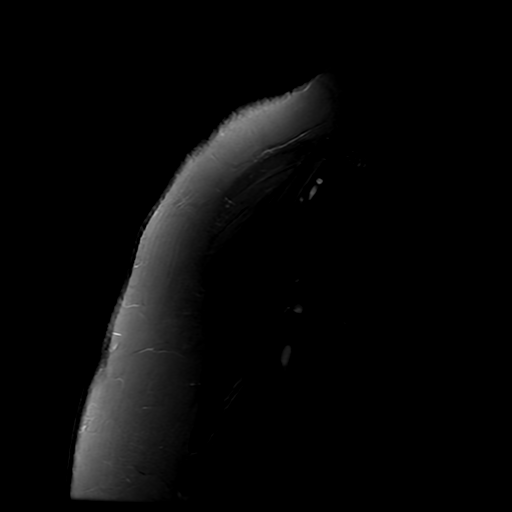
[im 7/21]
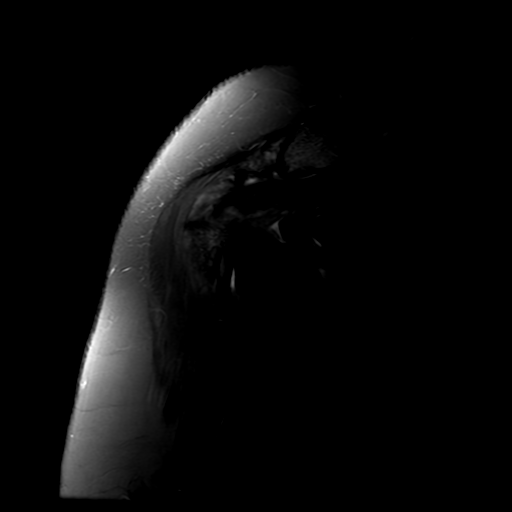
[im 14/21]
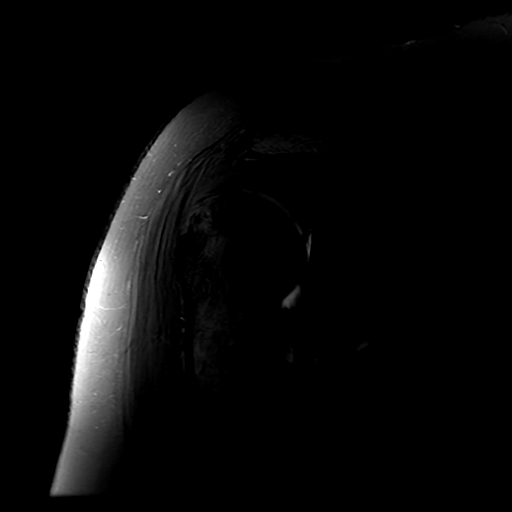
[im 21/21]
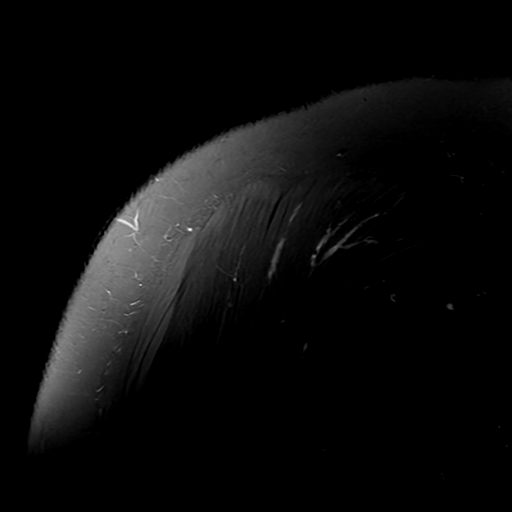

[Series 4: PD fat-sat · oblique · 4.0mm · 0.35mm/px · 4 of 21 slices shown]
[im 1/21]
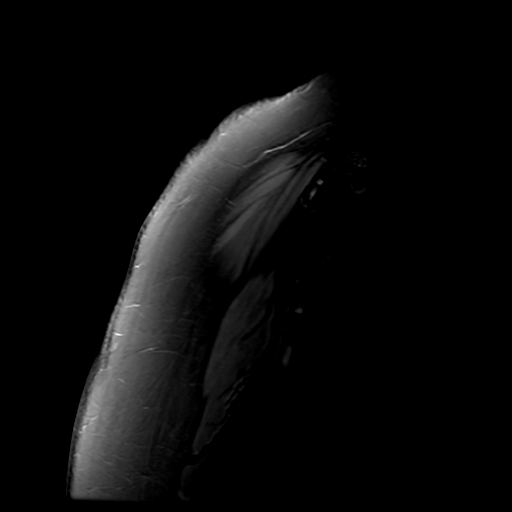
[im 7/21]
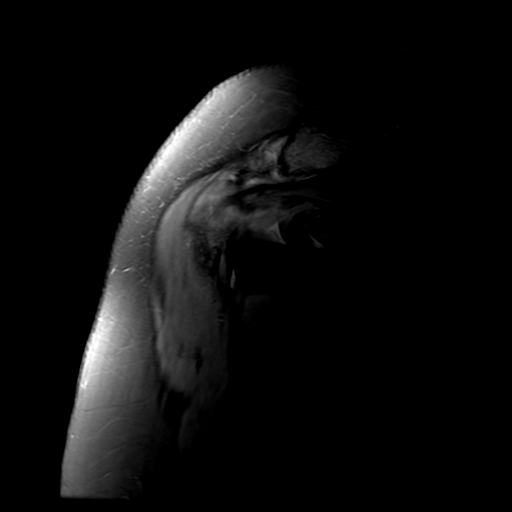
[im 14/21]
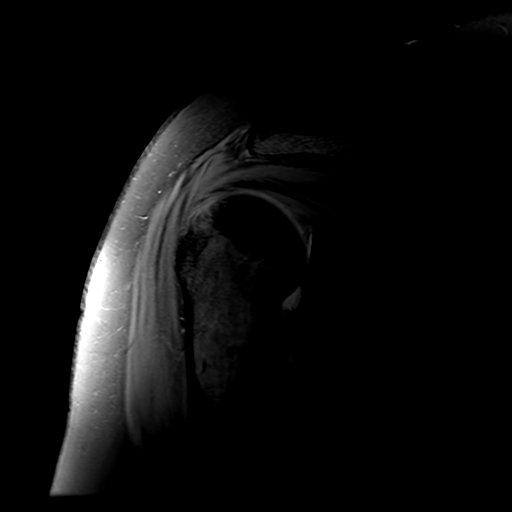
[im 21/21]
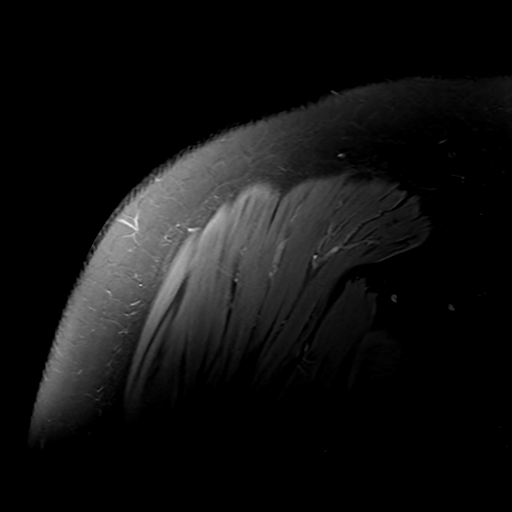

[Series 5: T2 fat-sat · oblique · 4.0mm · 0.35mm/px · 5 of 26 slices shown (3 of 3)]
[im 1/26]
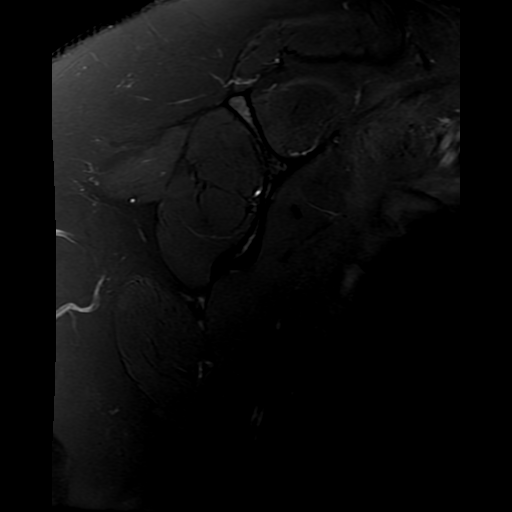
[im 7/26]
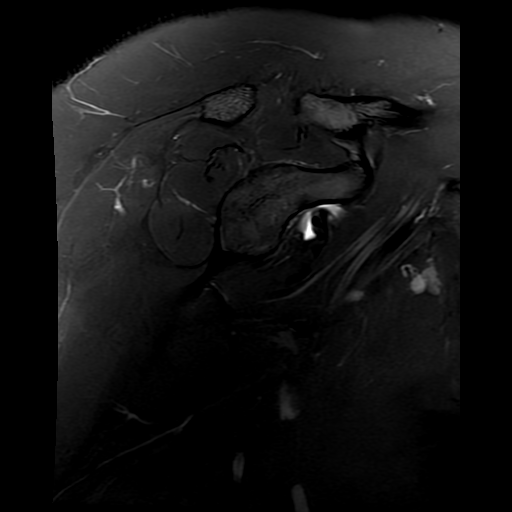
[im 13/26]
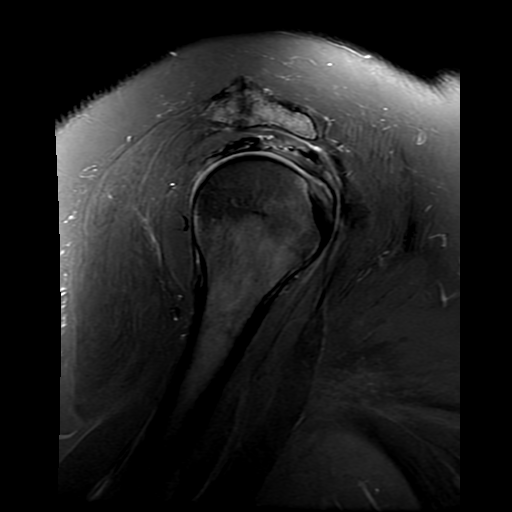
[im 19/26]
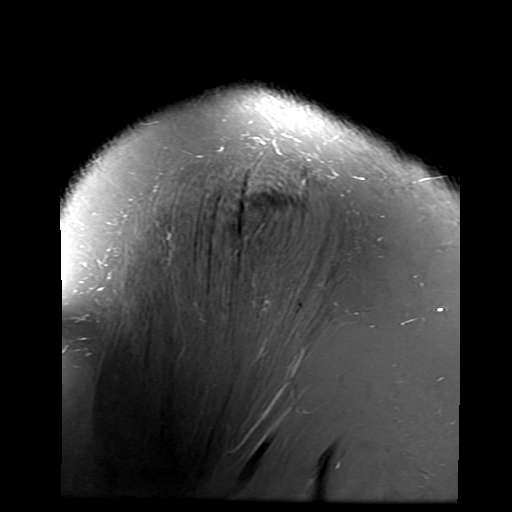
[im 26/26]
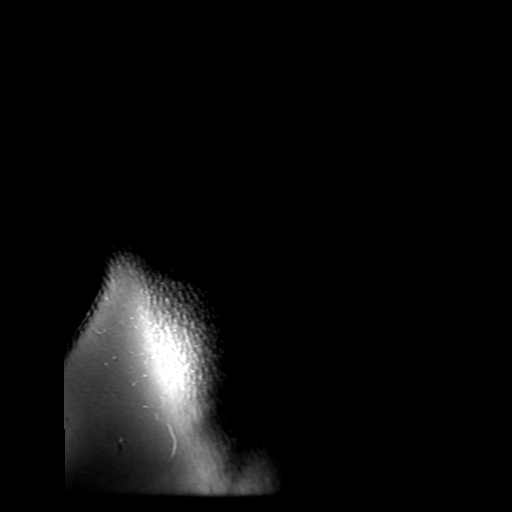

[19 of 40 positions shown; findings below may reference images not displayed]

FINDINGS: Rotator cuff: There is mild tendinosis of the distal supraspinatus
and infraspinatus tendons. There is a low-grade, partial width
articular-sided tear of the infraspinatus tendon at the footprint,
width measuring 8 mm (sagittal T2 image 17). Teres minor tendon is
intact. Subscapularis tendon is intact.

Muscles: No significant muscle atrophy.

Biceps Long Head: Intraarticular and extraarticular portions of the
biceps tendon are intact.

Acromioclavicular Joint: Moderate arthropathy of the
acromioclavicular joint. Minimal subacromial/subdeltoid bursal
fluid. There is an os acromiale with mild irregularity at the
synchondrosis.

Glenohumeral Joint: No significant joint effusion. No chondral
defect.

Labrum: Likely normal variant sublabral recess/foramen of the
anterior superior labrum. There is no other evidence of labral tear.

Bones: No fracture or dislocation. No aggressive osseous lesion.

Other: No fluid collection or hematoma. There is no abnormal
enhancement. No lymphadenopathy.
IMPRESSION: 1. Mild distal supraspinatus and infraspinatus tendinosis, with
low-grade, partial width articular-sided tear of the infraspinatus
tendon at the footprint. Mild subacromial-subdeltoid bursitis.
2. Moderate AC joint arthropathy. Os acromiale with mild
degenerative change of the synchondrosis.
3. No evidence of labral tear.  Intact biceps tendon.

## 2021-06-25 MED ORDER — GADOBUTROL 1 MMOL/ML IV SOLN
10.0000 mL | Freq: Once | INTRAVENOUS | Status: AC | PRN
  Administered 2021-06-25: 16:00:00 10 mL via INTRAVENOUS

## 2021-07-11 ENCOUNTER — Ambulatory Visit (HOSPITAL_BASED_OUTPATIENT_CLINIC_OR_DEPARTMENT_OTHER): Payer: PRIVATE HEALTH INSURANCE | Attending: Surgical | Admitting: Physical Therapy

## 2021-07-11 ENCOUNTER — Other Ambulatory Visit (HOSPITAL_BASED_OUTPATIENT_CLINIC_OR_DEPARTMENT_OTHER): Payer: Self-pay

## 2021-07-11 ENCOUNTER — Encounter (HOSPITAL_BASED_OUTPATIENT_CLINIC_OR_DEPARTMENT_OTHER): Payer: Self-pay | Admitting: Physical Therapy

## 2021-07-11 ENCOUNTER — Other Ambulatory Visit: Payer: Self-pay

## 2021-07-11 DIAGNOSIS — M6281 Muscle weakness (generalized): Secondary | ICD-10-CM | POA: Insufficient documentation

## 2021-07-11 DIAGNOSIS — M25611 Stiffness of right shoulder, not elsewhere classified: Secondary | ICD-10-CM | POA: Insufficient documentation

## 2021-07-11 DIAGNOSIS — M25511 Pain in right shoulder: Secondary | ICD-10-CM | POA: Insufficient documentation
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
## 2021-07-29 ENCOUNTER — Other Ambulatory Visit: Payer: Self-pay

## 2021-07-29 ENCOUNTER — Encounter (HOSPITAL_BASED_OUTPATIENT_CLINIC_OR_DEPARTMENT_OTHER): Payer: Self-pay | Admitting: Physical Therapy

## 2021-07-29 ENCOUNTER — Ambulatory Visit (HOSPITAL_BASED_OUTPATIENT_CLINIC_OR_DEPARTMENT_OTHER): Payer: PRIVATE HEALTH INSURANCE | Admitting: Physical Therapy

## 2021-07-29 DIAGNOSIS — M25611 Stiffness of right shoulder, not elsewhere classified: Secondary | ICD-10-CM | POA: Diagnosis not present

## 2021-07-29 DIAGNOSIS — M25511 Pain in right shoulder: Secondary | ICD-10-CM

## 2021-07-29 DIAGNOSIS — M6281 Muscle weakness (generalized): Secondary | ICD-10-CM

## 2021-07-29 NOTE — Therapy (Signed)
OUTPATIENT PHYSICAL THERAPY TREATMENT   Patient Name: Grace Hawkins MRN: [id number] DOB:[birth year], 46 y.o., female Today's Date: 07/29/2021   PT End of Session - 07/29/21 1046     Visit Number 2    Number of Visits 18    Date for PT Re-Evaluation 10/09/21    Authorization Type     PT Start Time 1015    PT Stop Time 1053    PT Time Calculation (min) 38 min    Activity Tolerance Patient tolerated treatment well    Behavior During Therapy [facility] for tasks assessed/performed              Past Medical History:  Diagnosis Date   Allergy    Elevated liver function tests 11/23/2013   Hypothyroid    Pyelonephritis 11/23/2013   History reviewed. No pertinent surgical history. Patient Active Problem List   Diagnosis Date Noted   Bulging lumbar disc 05/04/2020   Class 3 obesity (HCC) 04/04/2019   Hyperlipidemia, mixed 09/16/2018   Type 2 diabetes mellitus without complications (HCC) 12/17/2017   Hypothyroid     PCP: Donita Brooks, MD  REFERRING PROVIDER: Donita Brooks, MD  REFERRING DIAG: Right shoulder pain   THERAPY DIAG:  Decreased right shoulder range of motion  Acute pain of right shoulder  Muscle weakness (generalized)   ONSET DATE: October 2022  SUBJECTIVE:                                                                                                                                                                                      SUBJECTIVE STATEMENT: Pt states the pain is about the same. Pain is still there at rest. ADL are still painful. No problem HEP. Pt states she is compliant with HEP.   PERTINENT HISTORY: N/A  PAIN:  Are you having pain? Yes NPRS scale: 1/10 Pain location: lateral upper arm Pain orientation: Right  PAIN TYPE: dull and sharp Pain description: intermittent and constant  Aggravating factors: lifting, sleeping with arm under pillow, dressing/undressing, reaching, grooming Relieving factors: resting, meds    OCCUPATION: ED-charge or triage nurse   PATIENT GOALS : Pt states she would like to get rid of pain.   OBJECTIVE:   DIAGNOSTIC FINDINGS:  FINDINGS: Rotator cuff: There is mild tendinosis of the distal supraspinatus and infraspinatus tendons. There is a low-grade, partial width articular-sided tear of the infraspinatus tendon at the footprint, width measuring 8 mm (sagittal T2 image 17). Teres minor tendon is intact. Subscapularis tendon is intact.   Muscles: No significant muscle atrophy.   Biceps Long Head: Intraarticular and extraarticular portions of the biceps tendon are intact.  Acromioclavicular Joint: Moderate arthropathy of the acromioclavicular joint. Minimal subacromial/subdeltoid bursal fluid. 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   Glenohumeral Joint: No significant joint effusion. No chondral defect.   Labrum: Likely normal variant sublabral recess/foramen of the anterior superior labrum. There is no other evidence of labral tear.   Bones: No fracture or dislocation. No aggressive osseous lesion.   Other: No fluid collection or hematoma. There is no abnormal enhancement. No lymphadenopathy.   IMPRESSION: 1. Mild distal supraspinatus and infraspinatus tendinosis, with low-grade, partial width articular-sided tear of the infraspinatus tendon at the footprint. Mild subacromial-subdeltoid bursitis. 2. 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 3. No evidence of labral tear.  Intact biceps tendon.  PATIENT SURVEYS:  FOTO 56   67 at D/C 5 pt MCII    TODAY'S TREATMENT:   Joint mobs: grade I-II inf LAD with grade I oscillations to 60 deg ABD STM R infra  Exercises Isometric Shoulder External Rotation - 5s hold 10x cued for postural position Cross body stretch 15s 4x Isometric Shoulder ABB Rotation - 2 x daily - 7 x weekly - 1 sets - 10 reps - 5 hold Rowing YTB  2x10  Table flexion and ABD 5s 15x    PATIENT EDUCATION: Education details: anatomy, exercise progression, DOMS expectations, muscle firing,  envelope of function, HEP, POC  Person educated: Patient Education method: Explanation, Demonstration, Tactile cues, Verbal cues, and Handouts Education comprehension: verbalized understanding, returned demonstration, verbal cues required, and tactile cues required   HOME EXERCISE PROGRAM: Access Code: [email or web address] URL: [email or web address] Date: 07/11/2021 Prepared by: Zebedee IbaAlan Rolando Whitby  ASSESSMENT:  CLINICAL IMPRESSION: Pt with good response to manual and exercise at today's session. Exercise and manual did not exceed pt's baseline of 3/10 pain. Pt still with significant guarding and spasm with joint mobs. Pt able to tolerate progression to light resistance. Progress strength and AAROM as able. Pt at 1x/week due to schedule and driving distance. May change to 2x/week in the future if pain levels are still high with motion.   Objective impairments include decreased ROM, decreased strength, hypomobility, increased edema, increased muscle spasms, impaired flexibility, impaired UE functional use, improper body mechanics, postural dysfunction, obesity, and pain. These impairments are limiting patient from cleaning, community activity, driving, occupation, Pharmacologistlaundry, yard work, shopping, and Interior and spatial designerexercise/recreation . Personal factors including Age, Behavior pattern, Fitness, Profession, Time since onset of injury/illness/exacerbation, and 1-2 comorbidities:    are also affecting patient's functional outcome. Patient will benefit from skilled PT to address above impairments and improve overall function.  REHAB POTENTIAL: Good  CLINICAL DECISION MAKING: Stable/uncomplicated  EVALUATION COMPLEXITY: Low   GOALS:   SHORT TERM GOALS:  STG Name Target Date Goal status  1 Pt will become independent with HEP in order to demonstrate synthesis of PT  education.   Baseline:  09/09/2021 INITIAL  2 Pt will score at least 5 pt increase on FOTO to demonstrate functional improvement in MCII and pt perceived function.   Baseline:  09/09/2021 INITIAL  3 Pt will report at least 2 pt reduction on NPRS scale for pain in order to demonstrate functional improvement with household activity, self care, and ADL.  Baseline: 09/09/2021 INITIAL  4 Pt will be able to demonstrate BHB reach and OH reach without pain in order to demonstrate functional improvement in UEfunction for self-care and house hold duties.  Baseline: 09/09/2021 INITIAL   LONG TERM GOALS:   LTG Name Target Date Goal  status  1 Pt  will become independent with final HEP in order to demonstrate synthesis of PT education.  Baseline: 10/21/2021 INITIAL  2 Pt will be able to demonstrate ability to reach and remove item from high shelf in order to demonstrate functional improvement in UE function for self-care and house hold duties.  Baseline: 10/21/2021 INITIAL  3 Pt will be able to reach [facility] and carry/hold >5 lbs in order to demonstrate functional improvement in L UE strength for return to PLOF and exercise.  Baseline: 10/21/2021 INITIAL  4 Pt will score >/= 69 on FOTO to demonstrate improvement in perceived R shoulder function.  Baseline: 10/21/2021 INITIAL   PLAN: PT FREQUENCY: 1-2x/week  PT DURATION: 12 weeks (likely d/c by 8 weeks)  PLANNED INTERVENTIONS: Therapeutic exercises, Therapeutic activity, Neuro Muscular re-education, Patient/Family education, Joint mobilization, Aquatic Therapy, Dry Needling, Electrical stimulation, Wheelchair mobility training, Spinal mobilization, Cryotherapy, Moist heat, scar mobilization, Taping, Vasopneumatic device, Traction, Ultrasound, Ionotophoresis 4mg /ml Dexamethasone, and Manual therapy  PLAN FOR NEXT SESSION: review HEP, grade I-II joint mobs, PROM and AAROM as tolerated  PT, DPT 07/29/21 10:52 AM

## 2021-08-05 ENCOUNTER — Encounter (HOSPITAL_BASED_OUTPATIENT_CLINIC_OR_DEPARTMENT_OTHER): Payer: Self-pay | Admitting: Physical Therapy

## 2021-08-05 ENCOUNTER — Ambulatory Visit (HOSPITAL_BASED_OUTPATIENT_CLINIC_OR_DEPARTMENT_OTHER): Payer: PRIVATE HEALTH INSURANCE | Attending: Surgical | Admitting: Physical Therapy

## 2021-08-05 ENCOUNTER — Other Ambulatory Visit: Payer: Self-pay

## 2021-08-05 DIAGNOSIS — M25611 Stiffness of right shoulder, not elsewhere classified: Secondary | ICD-10-CM | POA: Diagnosis present

## 2021-08-05 DIAGNOSIS — M25511 Pain in right shoulder: Secondary | ICD-10-CM

## 2021-08-05 DIAGNOSIS — M6281 Muscle weakness (generalized): Secondary | ICD-10-CM

## 2021-08-05 NOTE — Therapy (Signed)
OUTPATIENT PHYSICAL THERAPY TREATMENT   Patient Name: Grace Hawkins MRN: [id number] DOB:11/01/1975, 46 y.o., female Today's Date: 08/05/2021   PT End of Session - 08/05/21 0938     Visit Number 3    Number of Visits 18    Date for PT Re-Evaluation 10/09/21    Authorization Type [place]    PT Start Time 0935    PT Stop Time 1015    PT Time Calculation (min) 40 min    Activity Tolerance Patient tolerated treatment well    Behavior During Therapy [facility] for tasks assessed/performed               Past Medical History:  Diagnosis Date   Allergy    Elevated liver function tests 11/23/2013   Hypothyroid    Pyelonephritis 11/23/2013   History reviewed. No pertinent surgical history. Patient Active Problem List   Diagnosis Date Noted   Bulging lumbar disc 05/04/2020   Class 3 obesity ([place]) 04/04/2019   Hyperlipidemia, mixed 09/16/2018   Type 2 diabetes mellitus without complications ([place]) 123XX123   Hypothyroid     PCP: Grace Frizzle, MD  REFERRING PROVIDER: Susy Frizzle, MD  REFERRING DIAG: Right shoulder pain   THERAPY DIAG:  Acute pain of right shoulder  Muscle weakness (generalized)  Decreased right shoulder range of motion   ONSET DATE: October 2022  SUBJECTIVE:                                                                                                                                                                                      SUBJECTIVE STATEMENT: Pt states the pain is about the same. Pain is still there at rest. ADL are still painful. No problem HEP. Pt states she is compliant with HEP.   PERTINENT HISTORY: N/A  PAIN:  Are you having pain? Yes NPRS scale: 1/10 Pain location: lateral upper arm Pain orientation: Right  PAIN TYPE: dull and sharp Pain description: intermittent and constant  Aggravating factors: lifting, sleeping with arm under pillow, dressing/undressing, reaching, grooming Relieving factors: resting, meds    OCCUPATION: ED-charge or triage nurse   PATIENT GOALS : Pt states she would like to get rid of pain.   OBJECTIVE:   DIAGNOSTIC FINDINGS:  FINDINGS: Rotator cuff: There is mild tendinosis of the distal supraspinatus and infraspinatus tendons. There is a low-grade, partial width articular-sided tear of the infraspinatus tendon at the footprint, width measuring 8 mm (sagittal T2 image 17). Teres minor tendon is intact. Subscapularis tendon is intact.   Muscles: No significant muscle atrophy.   Biceps Long Head: Intraarticular and extraarticular portions of the biceps tendon are intact.  Acromioclavicular Joint: Moderate arthropathy of the acromioclavicular joint. Minimal subacromial/subdeltoid bursal fluid. 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   Glenohumeral Joint: No significant joint effusion. No chondral defect.   Labrum: Likely normal variant sublabral recess/foramen of the anterior superior labrum. There is no other evidence of labral tear.   Bones: No fracture or dislocation. No aggressive osseous lesion.   Other: No fluid collection or hematoma. There is no abnormal enhancement. No lymphadenopathy.   IMPRESSION: 1. Mild distal supraspinatus and infraspinatus tendinosis, with low-grade, partial width articular-sided tear of the infraspinatus tendon at the footprint. Mild subacromial-subdeltoid bursitis. 2. 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 3. No evidence of labral tear.  Intact biceps tendon.  PATIENT SURVEYS:  FOTO 56   67 at D/C 5 pt MCII   TODAY'S TREATMENT:    STM R deltoid and biceps  Pulley's flexion and ABD  2 mins each  Home repetitions modified for decreased repetitions and longer rest breaks Isometric Shoulder External Rotation   Isometric Shoulder ABD Rotation - Rowing YTB  Table flexion and ABD   Review of Home Exercises during R shoulder AP  FC e-stim and MHP 52mins- 80hz , carrier freq 4000 hz, beat high 150 hz  PATIENT EDUCATION: Education details: self pain management, anatomy, exercise progression, DOMS expectations, muscle firing,  envelope of function, HEP, POC  Person educated: Patient Education method: Explanation, Demonstration, Tactile cues, Verbal cues, and Handouts Education comprehension: verbalized understanding, returned demonstration, verbal cues required, and tactile cues required   HOME EXERCISE PROGRAM: Access Code: [email or web address] URL: [email or web address] Date: 07/11/2021 Prepared by: Daleen Bo  ASSESSMENT:  CLINICAL IMPRESSION: Pt with increased pain and stiffness at today's session that may be related to introduction of new AROM exercise. Pt with increase pain following STM though she had good twitch response as well as improved soft tissue extensibility. Pt with limited ROM during pulleys and HEP review. Pt session ended with E-stim and review of HEP. Pt given edu about POC. Plan to see pt for 1-2 more visits. If no functional change in pain response or ROM, plan to refer back to MD for further medical management.   Objective impairments include decreased ROM, decreased strength, hypomobility, increased edema, increased muscle spasms, impaired flexibility, impaired UE functional use, improper body mechanics, postural dysfunction, obesity, and pain. These impairments are limiting patient from cleaning, community activity, driving, occupation, Medical sales representative, yard work, shopping, and Naval architect . Personal factors including Age, Behavior pattern, Fitness, Profession, Time since onset of injury/illness/exacerbation, and 1-2 comorbidities:    are also affecting patient's functional outcome. Patient will benefit from skilled PT to address above impairments and improve overall function.  REHAB POTENTIAL: Good  CLINICAL DECISION MAKING: Stable/uncomplicated  EVALUATION COMPLEXITY:  Low   GOALS:   SHORT TERM GOALS:  STG Name Target Date Goal status  1 Pt will become independent with HEP in order to demonstrate synthesis of PT education.   Baseline:  09/16/2021 INITIAL  2 Pt will score at least 5 pt increase on FOTO to demonstrate functional improvement in MCII and pt perceived function.   Baseline:  09/16/2021 INITIAL  3 Pt will report at least 2 pt reduction on NPRS scale for pain in order to demonstrate functional improvement with household activity, self care, and ADL.  Baseline: 09/16/2021 INITIAL  4 Pt will be able to demonstrate BHB reach and OH reach without pain in order to demonstrate functional improvement in UEfunction for self-care and  house hold duties.  Baseline: 09/16/2021 INITIAL   LONG TERM GOALS:   LTG Name Target Date Goal status  1 Pt  will become independent with final HEP in order to demonstrate synthesis of PT education.  Baseline: 10/28/2021 INITIAL  2 Pt will be able to demonstrate ability to reach and remove item from high shelf in order to demonstrate functional improvement in UE function for self-care and house hold duties.  Baseline: 10/28/2021 INITIAL  3 Pt will be able to reach [facility] and carry/hold >5 lbs in order to demonstrate functional improvement in L UE strength for return to PLOF and exercise.  Baseline: 10/28/2021 INITIAL  4 Pt will score >/= 69 on FOTO to demonstrate improvement in perceived R shoulder function.  Baseline: 10/28/2021 INITIAL   PLAN: PT FREQUENCY: 1-2x/week  PT DURATION: 12 weeks (likely d/c by 8 weeks)  PLANNED INTERVENTIONS: Therapeutic exercises, Therapeutic activity, Neuro Muscular re-education, Patient/Family education, Joint mobilization, Aquatic Therapy, Dry Needling, Electrical stimulation, Wheelchair mobility training, Spinal mobilization, Cryotherapy, Moist heat, scar mobilization, Taping, Vasopneumatic device, Traction, Ultrasound, Ionotophoresis 4mg /ml Dexamethasone, and Manual therapy  PLAN FOR NEXT  SESSION: review HEP, grade I-II joint mobs, PROM and AAROM as tolerated  Daleen Bo PT, DPT 08/05/21 10:17 AM

## 2021-08-12 ENCOUNTER — Other Ambulatory Visit: Payer: Self-pay

## 2021-08-12 ENCOUNTER — Ambulatory Visit (HOSPITAL_BASED_OUTPATIENT_CLINIC_OR_DEPARTMENT_OTHER): Payer: PRIVATE HEALTH INSURANCE | Admitting: Physical Therapy

## 2021-08-12 ENCOUNTER — Encounter (HOSPITAL_BASED_OUTPATIENT_CLINIC_OR_DEPARTMENT_OTHER): Payer: Self-pay | Admitting: Physical Therapy

## 2021-08-12 DIAGNOSIS — M25611 Stiffness of right shoulder, not elsewhere classified: Secondary | ICD-10-CM

## 2021-08-12 DIAGNOSIS — M6281 Muscle weakness (generalized): Secondary | ICD-10-CM

## 2021-08-12 DIAGNOSIS — M25511 Pain in right shoulder: Secondary | ICD-10-CM | POA: Diagnosis not present

## 2021-08-12 NOTE — Therapy (Signed)
OUTPATIENT PHYSICAL THERAPY TREATMENT   Patient Name: Grace Hawkins MRN: [id number] DOB:1975-10-18, 46 y.o., female Today's Date: 08/12/2021   PT End of Session - 08/12/21 0944     Visit Number 4    Number of Visits 18    Date for PT Re-Evaluation 10/09/21    Authorization Type [place]    PT Start Time 0932    PT Stop Time 1010    PT Time Calculation (min) 38 min    Activity Tolerance Patient tolerated treatment well    Behavior During Therapy [facility] for tasks assessed/performed                Past Medical History:  Diagnosis Date   Allergy    Elevated liver function tests 11/23/2013   Hypothyroid    Pyelonephritis 11/23/2013   History reviewed. No pertinent surgical history. Patient Active Problem List   Diagnosis Date Noted   Bulging lumbar disc 05/04/2020   Class 3 obesity ([place]) 04/04/2019   Hyperlipidemia, mixed 09/16/2018   Type 2 diabetes mellitus without complications ([place]) 123XX123   Hypothyroid     PCP: Susy Frizzle, MD  REFERRING PROVIDER: Susy Frizzle, MD  REFERRING DIAG: Right shoulder pain   THERAPY DIAG:  Acute pain of right shoulder  Muscle weakness (generalized)  Decreased right shoulder range of motion   ONSET DATE: October 2022  SUBJECTIVE:                                                                                                                                                                                      SUBJECTIVE STATEMENT: Pt states she was in pain for a day or two after last session. However, she does not that the pain is better. She still has morning pain and pain that wakes her up at night.   PERTINENT HISTORY: N/A  PAIN:  Are you having pain? Yes NPRS scale: 2/10 Pain location: lateral upper arm Pain orientation: Right  PAIN TYPE: dull and sharp Pain description: intermittent and constant  Aggravating factors: lifting, sleeping with arm under pillow, dressing/undressing, reaching,  grooming Relieving factors: resting, meds   OCCUPATION: ED-charge or triage nurse   PATIENT GOALS : Pt states she would like to get rid of pain.   OBJECTIVE:   DIAGNOSTIC FINDINGS:  FINDINGS: Rotator cuff: There is mild tendinosis of the distal supraspinatus and infraspinatus tendons. There is a low-grade, partial width articular-sided tear of the infraspinatus tendon at the footprint, width measuring 8 mm (sagittal T2 image 17). Teres minor tendon is intact. Subscapularis tendon is intact.   Muscles: No significant muscle atrophy.   Biceps Long Head: Intraarticular and  extraarticular portions of the biceps tendon are intact.   Acromioclavicular Joint: Moderate arthropathy of the acromioclavicular joint. Minimal subacromial/subdeltoid bursal fluid. 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   Glenohumeral Joint: No significant joint effusion. No chondral defect.   Labrum: Likely normal variant sublabral recess/foramen of the anterior superior labrum. There is no other evidence of labral tear.   Bones: No fracture or dislocation. No aggressive osseous lesion.   Other: No fluid collection or hematoma. There is no abnormal enhancement. No lymphadenopathy.   IMPRESSION: 1. Mild distal supraspinatus and infraspinatus tendinosis, with low-grade, partial width articular-sided tear of the infraspinatus tendon at the footprint. Mild subacromial-subdeltoid bursitis. 2. 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 3. No evidence of labral tear.  Intact biceps tendon.  PATIENT SURVEYS:  FOTO 56   67 at D/C 5 pt MCII   TODAY'S TREATMENT:     Pulley's flexion, scaption, and ABD  2 mins each   Isometric Shoulder External Rotation  Isometric Shoulder ABD Rotation - Shoulder extension 5x5 YTB Table flexion and ER 2-3s 2x10  Cane extension stretch 2-3s 2x10 Wall push up 3x5 Scap depression  on table 2s 10x with ball    PATIENT EDUCATION: Education details: home TENS unit pad placement and set up, self pain management, anatomy, exercise progression, envelope of function, HEP  Person educated: Patient Education method: Explanation, Demonstration, Tactile cues, Verbal cues, and Handouts Education comprehension: verbalized understanding, returned demonstration, verbal cues required, and tactile cues required   HOME EXERCISE PROGRAM: Access Code: [email or web address] URL: [email or web address] Date: 07/11/2021 Prepared by: Daleen Bo  ASSESSMENT:  CLINICAL IMPRESSION: Pt with improved ability to tolerate R arm ROM as well as gentle resistance without significant increasing pain. Pt with report of stiffness with all exercise but pain does not exceed 4/10 throughout session. Pt was able to start light CKC exercise for HEP. Pt does appear to be improving at this time. Pt returns to MD in 2 weeks and will consider asking for injection to help manage pain like in previous treatment.  Objective impairments include decreased ROM, decreased strength, hypomobility, increased edema, increased muscle spasms, impaired flexibility, impaired UE functional use, improper body mechanics, postural dysfunction, obesity, and pain. These impairments are limiting patient from cleaning, community activity, driving, occupation, Medical sales representative, yard work, shopping, and Naval architect . Personal factors including Age, Behavior pattern, Fitness, Profession, Time since onset of injury/illness/exacerbation, and 1-2 comorbidities:    are also affecting patient's functional outcome. Patient will benefit from skilled PT to address above impairments and improve overall function.  REHAB POTENTIAL: Good  CLINICAL DECISION MAKING: Stable/uncomplicated  EVALUATION COMPLEXITY: Low   GOALS:   SHORT TERM GOALS:  STG Name Target Date Goal status  1 Pt will become independent with HEP in order to demonstrate  synthesis of PT education.   Baseline:  09/23/2021 INITIAL  2 Pt will score at least 5 pt increase on FOTO to demonstrate functional improvement in MCII and pt perceived function.   Baseline:  09/23/2021 INITIAL  3 Pt will report at least 2 pt reduction on NPRS scale for pain in order to demonstrate functional improvement with household activity, self care, and ADL.  Baseline: 09/23/2021 INITIAL  4 Pt will be able to demonstrate BHB reach and OH reach without pain in order to demonstrate functional improvement in UEfunction for self-care and house hold duties.  Baseline: 09/23/2021 INITIAL   LONG TERM GOALS:   LTG Name Target Date  Goal status  1 Pt  will become independent with final HEP in order to demonstrate synthesis of PT education.  Baseline: 11/04/2021 INITIAL  2 Pt will be able to demonstrate ability to reach and remove item from high shelf in order to demonstrate functional improvement in UE function for self-care and house hold duties.  Baseline: 11/04/2021 INITIAL  3 Pt will be able to reach [facility] and carry/hold >5 lbs in order to demonstrate functional improvement in L UE strength for return to PLOF and exercise.  Baseline: 11/04/2021 INITIAL  4 Pt will score >/= 69 on FOTO to demonstrate improvement in perceived R shoulder function.  Baseline: 11/04/2021 INITIAL   PLAN: PT FREQUENCY: 1-2x/week  PT DURATION: 12 weeks (likely d/c by 8 weeks)  PLANNED INTERVENTIONS: Therapeutic exercises, Therapeutic activity, Neuro Muscular re-education, Patient/Family education, Joint mobilization, Aquatic Therapy, Dry Needling, Electrical stimulation, Wheelchair mobility training, Spinal mobilization, Cryotherapy, Moist heat, scar mobilization, Taping, Vasopneumatic device, Traction, Ultrasound, Ionotophoresis 4mg /ml Dexamethasone, and Manual therapy  PLAN FOR NEXT SESSION: review HEP, grade I-II joint mobs, PROM and AAROM as tolerated  Daleen Bo PT, DPT 08/12/21 10:12 AM

## 2021-08-18 DIAGNOSIS — M6281 Muscle weakness (generalized): Secondary | ICD-10-CM | POA: Diagnosis present

## 2021-08-18 DIAGNOSIS — M25511 Pain in right shoulder: Secondary | ICD-10-CM | POA: Diagnosis present

## 2021-08-18 DIAGNOSIS — M25611 Stiffness of right shoulder, not elsewhere classified: Secondary | ICD-10-CM | POA: Diagnosis present

## 2021-08-19 ENCOUNTER — Ambulatory Visit (HOSPITAL_BASED_OUTPATIENT_CLINIC_OR_DEPARTMENT_OTHER): Payer: PRIVATE HEALTH INSURANCE | Admitting: Physical Therapy

## 2021-08-19 ENCOUNTER — Encounter (HOSPITAL_BASED_OUTPATIENT_CLINIC_OR_DEPARTMENT_OTHER): Payer: Self-pay | Admitting: Physical Therapy

## 2021-08-19 ENCOUNTER — Other Ambulatory Visit (HOSPITAL_BASED_OUTPATIENT_CLINIC_OR_DEPARTMENT_OTHER): Payer: Self-pay

## 2021-08-19 ENCOUNTER — Other Ambulatory Visit: Payer: Self-pay | Admitting: Family Medicine

## 2021-08-19 ENCOUNTER — Other Ambulatory Visit: Payer: Self-pay

## 2021-08-19 DIAGNOSIS — M25611 Stiffness of right shoulder, not elsewhere classified: Secondary | ICD-10-CM

## 2021-08-19 DIAGNOSIS — M25511 Pain in right shoulder: Secondary | ICD-10-CM | POA: Diagnosis not present

## 2021-08-19 DIAGNOSIS — M6281 Muscle weakness (generalized): Secondary | ICD-10-CM

## 2021-08-19 NOTE — Therapy (Signed)
OUTPATIENT PHYSICAL THERAPY TREATMENT   Patient Name: Grace Hawkins MRN: [id number] DOB:1975-09-23, 46 y.o., female Today's Date: 08/19/2021   PT End of Session - 08/19/21 1020     Visit Number 5    Number of Visits 18    Date for PT Re-Evaluation 10/09/21    Authorization Type [place]    PT Start Time 1018    PT Stop Time 1050    PT Time Calculation (min) 32 min    Activity Tolerance Patient tolerated treatment well    Behavior During Therapy [facility] for tasks assessed/performed                 Past Medical History:  Diagnosis Date   Allergy    Elevated liver function tests 11/23/2013   Hypothyroid    Pyelonephritis 11/23/2013   History reviewed. No pertinent surgical history. Patient Active Problem List   Diagnosis Date Noted   Bulging lumbar disc 05/04/2020   Class 3 obesity ([facility]) 04/04/2019   Hyperlipidemia, mixed 09/16/2018   Type 2 diabetes mellitus without complications ([facility]) 12/17/2017   Hypothyroid     PCP: Donita Brooks, MD  REFERRING PROVIDER: Donita Brooks, MD  REFERRING DIAG: Right shoulder pain   THERAPY DIAG:  Acute pain of right shoulder  Muscle weakness (generalized)  Decreased right shoulder range of motion   ONSET DATE: October 2022  SUBJECTIVE:                                                                                                                                                                                      SUBJECTIVE STATEMENT: Pt states that the shoulder pain has not changed. She states that she has R arm pain with sleeping. Pt sees MD next week to ask about injection options.  PERTINENT HISTORY: N/A  PAIN:  Are you having pain? Yes NPRS scale: 2/10 Pain location: lateral upper arm Pain orientation: Right  PAIN TYPE: dull and sharp Pain description: intermittent and constant  Aggravating factors: lifting, sleeping with arm under pillow, dressing/undressing, reaching, grooming Relieving factors:  resting, meds   OCCUPATION: ED-charge or triage nurse   PATIENT GOALS : Pt states she would like to get rid of pain.   OBJECTIVE:   DIAGNOSTIC FINDINGS:  FINDINGS: Rotator cuff: There is mild tendinosis of the distal supraspinatus and infraspinatus tendons. There is a low-grade, partial width articular-sided tear of the infraspinatus tendon at the footprint, width measuring 8 mm (sagittal T2 image 17). Teres minor tendon is intact. Subscapularis tendon is intact.   Muscles: No significant muscle atrophy.   Biceps Long Head: Intraarticular and extraarticular portions of the biceps tendon are  intact.   Acromioclavicular Joint: Moderate arthropathy of the acromioclavicular joint. Minimal subacromial/subdeltoid bursal fluid. 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   Glenohumeral Joint: No significant joint effusion. No chondral defect.   Labrum: Likely normal variant sublabral recess/foramen of the anterior superior labrum. There is no other evidence of labral tear.   Bones: No fracture or dislocation. No aggressive osseous lesion.   Other: No fluid collection or hematoma. There is no abnormal enhancement. No lymphadenopathy.   IMPRESSION: 1. Mild distal supraspinatus and infraspinatus tendinosis, with low-grade, partial width articular-sided tear of the infraspinatus tendon at the footprint. Mild subacromial-subdeltoid bursitis. 2. 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 3. No evidence of labral tear.  Intact biceps tendon.  PATIENT SURVEYS:  FOTO 56   67 at D/C 5 pt MCII  FOTO 5th visit 2/20 55   TODAY'S TREATMENT:   Chin tuck 2s 10x (no change in lateral arm pain) Pulley's flexion, scaption, and ABD  2 mins each Crossbody stretch 20s 3x Wall pec stretch 30s 3x Cane extension stretch 2-3s 2x10 Wall ball circles 20x CW and CCW  PATIENT EDUCATION: Education details: self  pain management, anatomy, exercise progression, envelope of function, HEP  Person educated: Patient Education method: Explanation, Demonstration, Tactile cues, Verbal cues, and Handouts Education comprehension: verbalized understanding, returned demonstration, verbal cues required, and tactile cues required   HOME EXERCISE PROGRAM: Access Code: [email or web address] URL: [email or web address] Date: 07/11/2021 Prepared by: Zebedee Iba  ASSESSMENT:  CLINICAL IMPRESSION: Pt does maintain previously gained ROM and has less pain with AROM, but pt is still significantly limited with anything past 90 deg of flexion or ABD due to increased pain. Pt session ended due to increased stiffness and discomfort with A/AROM. Pt's FOTO score also demonstrates plateau of progress. Pt to return to MD for further medical management and consider options for rehab following MD appt.   Objective impairments include decreased ROM, decreased strength, hypomobility, increased edema, increased muscle spasms, impaired flexibility, impaired UE functional use, improper body mechanics, postural dysfunction, obesity, and pain. These impairments are limiting patient from cleaning, community activity, driving, occupation, Pharmacologist, yard work, shopping, and Interior and spatial designer . Personal factors including Age, Behavior pattern, Fitness, Profession, Time since onset of injury/illness/exacerbation, and 1-2 comorbidities:    are also affecting patient's functional outcome. Patient will benefit from skilled PT to address above impairments and improve overall function.  REHAB POTENTIAL: Good  CLINICAL DECISION MAKING: Stable/uncomplicated  EVALUATION COMPLEXITY: Low   GOALS:   SHORT TERM GOALS:  STG Name Target Date Goal status  1 Pt will become independent with HEP in order to demonstrate synthesis of PT education.   Baseline:  09/30/2021 INITIAL  2 Pt will score at least 5 pt increase on FOTO to demonstrate functional  improvement in MCII and pt perceived function.   Baseline:  09/30/2021 INITIAL  3 Pt will report at least 2 pt reduction on NPRS scale for pain in order to demonstrate functional improvement with household activity, self care, and ADL.  Baseline: 09/30/2021 INITIAL  4 Pt will be able to demonstrate BHB reach and OH reach without pain in order to demonstrate functional improvement in UEfunction for self-care and house hold duties.  Baseline: 09/30/2021 INITIAL   LONG TERM GOALS:   LTG Name Target Date Goal status  1 Pt  will become independent with final HEP in order to demonstrate synthesis of PT education.  Baseline: 11/11/2021 INITIAL  2 Pt will  be able to demonstrate ability to reach and remove item from high shelf in order to demonstrate functional improvement in UE function for self-care and house hold duties.  Baseline: 11/11/2021 INITIAL  3 Pt will be able to reach [facility] and carry/hold >5 lbs in order to demonstrate functional improvement in L UE strength for return to PLOF and exercise.  Baseline: 11/11/2021 INITIAL  4 Pt will score >/= 69 on FOTO to demonstrate improvement in perceived R shoulder function.  Baseline: 11/11/2021 INITIAL   PLAN: PT FREQUENCY: 1-2x/week  PT DURATION: 12 weeks (likely d/c by 8 weeks)  PLANNED INTERVENTIONS: Therapeutic exercises, Therapeutic activity, Neuro Muscular re-education, Patient/Family education, Joint mobilization, Aquatic Therapy, Dry Needling, Electrical stimulation, Wheelchair mobility training, Spinal mobilization, Cryotherapy, Moist heat, scar mobilization, Taping, Vasopneumatic device, Traction, Ultrasound, Ionotophoresis 4mg /ml Dexamethasone, and Manual therapy  PLAN FOR NEXT SESSION: review HEP, grade I-II joint mobs, PROM and AAROM as tolerated  Zebedee Iba PT, DPT 08/19/21 10:54 AM

## 2021-08-20 ENCOUNTER — Other Ambulatory Visit (HOSPITAL_BASED_OUTPATIENT_CLINIC_OR_DEPARTMENT_OTHER): Payer: Self-pay

## 2021-08-20 ENCOUNTER — Other Ambulatory Visit: Payer: Self-pay | Admitting: Family Medicine

## 2021-08-21 ENCOUNTER — Other Ambulatory Visit (HOSPITAL_BASED_OUTPATIENT_CLINIC_OR_DEPARTMENT_OTHER): Payer: Self-pay

## 2021-08-21 ENCOUNTER — Other Ambulatory Visit: Payer: Self-pay | Admitting: Family Medicine

## 2021-08-21 MED ORDER — SYNTHROID 125 MCG PO TABS
250.0000 ug | ORAL_TABLET | Freq: Every day | ORAL | 1 refills | Status: DC
  Filled 2021-08-21: qty 60, 30d supply, fill #0
  Filled 2021-09-18: qty 60, 30d supply, fill #1

## 2021-09-03 ENCOUNTER — Other Ambulatory Visit: Payer: Self-pay

## 2021-09-03 ENCOUNTER — Ambulatory Visit (HOSPITAL_BASED_OUTPATIENT_CLINIC_OR_DEPARTMENT_OTHER): Payer: PRIVATE HEALTH INSURANCE | Attending: Orthopedic Surgery | Admitting: Physical Therapy

## 2021-09-03 ENCOUNTER — Encounter (HOSPITAL_BASED_OUTPATIENT_CLINIC_OR_DEPARTMENT_OTHER): Payer: Self-pay | Admitting: Physical Therapy

## 2021-09-03 DIAGNOSIS — M25511 Pain in right shoulder: Secondary | ICD-10-CM | POA: Insufficient documentation

## 2021-09-03 DIAGNOSIS — M25611 Stiffness of right shoulder, not elsewhere classified: Secondary | ICD-10-CM | POA: Diagnosis present

## 2021-09-03 DIAGNOSIS — M6281 Muscle weakness (generalized): Secondary | ICD-10-CM | POA: Diagnosis present

## 2021-09-03 NOTE — Therapy (Signed)
OUTPATIENT PHYSICAL THERAPY TREATMENT   Patient Name: Grace Hawkins MRN: [id number] DOB:[birth year], 46 y.o., female Today's Date: 09/03/2021   PT End of Session - 09/03/21 0856     Visit Number 6    Number of Visits 18    Date for PT Re-Evaluation 10/09/21    Authorization Type [employer]    PT Start Time [phone]    PT Stop Time 0854    PT Time Calculation (min) 40 min    Activity Tolerance Patient tolerated treatment well    Behavior During Therapy [facility] for tasks assessed/performed                  Past Medical History:  Diagnosis Date   Allergy    Elevated liver function tests 11/23/2013   Hypothyroid    Pyelonephritis 11/23/2013   History reviewed. No pertinent surgical history. Patient Active Problem List   Diagnosis Date Noted   Bulging lumbar disc 05/04/2020   Class 3 obesity (HCC) 04/04/2019   Hyperlipidemia, mixed 09/16/2018   Type 2 diabetes mellitus without complications (HCC) 12/17/2017   Hypothyroid     PCP: Donita Brooks, MD  REFERRING PROVIDER: Jones Broom, MD  REFERRING DIAG: Right shoulder pain   THERAPY DIAG:  Acute pain of right shoulder  Muscle weakness (generalized)  Decreased right shoulder range of motion   ONSET DATE: October 2022  SUBJECTIVE:                                                                                                                                                                                      SUBJECTIVE STATEMENT: Pt saw MD last Monday and received an injection.  Pt reports improved pain since injection but can still feel it.  She did have a throbbing pain for the next couple of days.  She feels like it is already wearing off, but her movement is better.  Pt reports her pain is waking her up in the AM.  and received a new PT order indicating Right shoulder partial rotator cuff tear.  He spoke with pt about surgery and Pt does not want to do surgery.  Pt instructed to cont with PT and return to MD  in 4 weeks. Pt reports having increased pain after prior Rx.  Pt reports compliance with HEP.   Pt unable to throw softballs with daughter and unable to toss coat in back seat.  Pt is limited with lifting objects and limited in lifting R UE.  Pt has pain and limitations with washing and combing hair and hanging up clothes in closet.     PERTINENT HISTORY: N/A  PAIN:  Are you  having pain? Yes NPRS scale: 1/10 Pain location: lateral upper arm Pain orientation: Right  PAIN TYPE: dull and sharp Pain description: intermittent and constant  Aggravating factors: lifting, sleeping with arm under pillow, dressing/undressing, reaching, grooming Relieving factors: resting, meds   OCCUPATION: ED-charge or triage nurse   PATIENT GOALS : Pt states she would like to get rid of pain.   OBJECTIVE:   DIAGNOSTIC FINDINGS:  FINDINGS: Rotator cuff: There is mild tendinosis of the distal supraspinatus and infraspinatus tendons. There is a low-grade, partial width articular-sided tear of the infraspinatus tendon at the footprint, width measuring 8 mm (sagittal T2 image 17). Teres minor tendon is intact. Subscapularis tendon is intact.   Muscles: No significant muscle atrophy.   Biceps Long Head: Intraarticular and extraarticular portions of the biceps tendon are intact.   Acromioclavicular Joint: Moderate arthropathy of the acromioclavicular joint. Minimal subacromial/subdeltoid bursal fluid. 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   Glenohumeral Joint: No significant joint effusion. No chondral defect.   Labrum: Likely normal variant sublabral recess/foramen of the anterior superior labrum. There is no other evidence of labral tear.   Bones: No fracture or dislocation. No aggressive osseous lesion.   Other: No fluid collection or hematoma. There is no abnormal enhancement. No lymphadenopathy.   IMPRESSION: 1. Mild distal supraspinatus and infraspinatus  tendinosis, with low-grade, partial width articular-sided tear of the infraspinatus tendon at the footprint. Mild subacromial-subdeltoid bursitis. 2. 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 3. No evidence of labral tear.  Intact biceps tendon.  PATIENT SURVEYS:  FOTO 56   67 at D/C 5 pt MCII  FOTO 5th visit 2/20 55   TODAY'S TREATMENT:    Shoulder standing AROM: Initial:  Flex:  130 deg with pain at 90 deg             Abd:  105 deg with pain Current:  Flex:  164 deg with 2/10 pain,  Abd: 128 deg with 3-4/10 pain.   Pt has pain at 90 deg flexion    Therapeutic Exercise:   -Reviewed current function, pain level, response to prior Rx, and HEP compliance. -Assessed AROM: -Pt performed: Supine serratus punches 2 sets of approx 5 reps though stopped due to pain Prone extension 2x10 reps Prone row  2x15 reps Pulley's flexion and scaption 2 mins each  Neuro Re-ed Activities: Supine rhythmic stabs 3x30 sec at 90 deg Supine shoulder ABC x 1 rep Wall ball circles 20x CW and CCW  Manual Therapy:  Pt received gentle distraction with oscillation and Grade II post and inf Jobs to improve pain, stiffness, and to normalize arthrokinematics. Pt received R shoulder flexion, scaption, and ER PROM per tissue tolerance and pt tolerance.    PATIENT EDUCATION: Education details: self pain management, anatomy, exercise progression, envelope of function, HEP  Person educated: Patient Education method: Explanation, Demonstration, Tactile cues, Verbal cues, and Handouts Education comprehension: verbalized understanding, returned demonstration, verbal cues required, and tactile cues required   HOME EXERCISE PROGRAM: Access Code: [email or web address] URL: [email or web address] Date: 07/11/2021 Prepared by: Zebedee Iba   ASSESSMENT:  CLINICAL IMPRESSION: Pt saw MD and received an injection.  She states the injection is wearing  off though she has improved shoulder mobility.  Pt demonstrates improved shoulder AROM from 130 deg in flexion initially to 164 deg currently and 105 in abd initially to 128 deg currently.  Pt tolerated exercises better today though did have some pain with  exercises t/o Rx.  Pt reports increased pain from 1/10 before Rx to 2-3/10 after Rx.  Patient should benefit from continued skilled PT to address goals and impairments and improve overall function   Objective impairments include decreased ROM, decreased strength, hypomobility, increased edema, increased muscle spasms, impaired flexibility, impaired UE functional use, improper body mechanics, postural dysfunction, obesity, and pain. These impairments are limiting patient from cleaning, community activity, driving, occupation, Pharmacologist, yard work, shopping, and Interior and spatial designer . Personal factors including Age, Behavior pattern, Fitness, Profession, Time since onset of injury/illness/exacerbation, and 1-2 comorbidities:    are also affecting patient's functional outcome.   REHAB POTENTIAL: Good  CLINICAL DECISION MAKING: Stable/uncomplicated  EVALUATION COMPLEXITY: Low   GOALS:   SHORT TERM GOALS:  STG Name Target Date Goal status  1 Pt will become independent with HEP in order to demonstrate synthesis of PT education.   Baseline:  10/15/2021 INITIAL  2 Pt will score at least 5 pt increase on FOTO to demonstrate functional improvement in MCII and pt perceived function.   Baseline:  10/15/2021 INITIAL  3 Pt will report at least 2 pt reduction on NPRS scale for pain in order to demonstrate functional improvement with household activity, self care, and ADL.  Baseline: 10/15/2021 INITIAL  4 Pt will be able to demonstrate BHB reach and OH reach without pain in order to demonstrate functional improvement in UEfunction for self-care and house hold duties.  Baseline: 10/15/2021 INITIAL   LONG TERM GOALS:   LTG Name Target Date Goal status  1  Pt  will become independent with final HEP in order to demonstrate synthesis of PT education.  Baseline: 11/26/2021 INITIAL  2 Pt will be able to demonstrate ability to reach and remove item from high shelf in order to demonstrate functional improvement in UE function for self-care and house hold duties.  Baseline: 11/26/2021 INITIAL  3 Pt will be able to reach [facility] and carry/hold >5 lbs in order to demonstrate functional improvement in L UE strength for return to PLOF and exercise.  Baseline: 11/26/2021 INITIAL  4 Pt will score >/= 69 on FOTO to demonstrate improvement in perceived R shoulder function.  Baseline: 11/26/2021 INITIAL   PLAN: PT FREQUENCY: 1-2x/week  PT DURATION: 12 weeks (likely d/c by 8 weeks)  PLANNED INTERVENTIONS: Therapeutic exercises, Therapeutic activity, Neuro Muscular re-education, Patient/Family education, Joint mobilization, Aquatic Therapy, Dry Needling, Electrical stimulation, Wheelchair mobility training, Spinal mobilization, Cryotherapy, Moist heat, scar mobilization, Taping, Vasopneumatic device, Traction, Ultrasound, Ionotophoresis 4mg /ml Dexamethasone, and Manual therapy  PLAN FOR NEXT SESSION: Cont with grade I-II joint mobs, PROM and AAROM as tolerated.  Cont with scapular exercises per pt tolerance.  Pt to return to MD on 09/30/2021   Audie Clear III PT, DPT 09/03/21 5:34 PM

## 2021-09-12 ENCOUNTER — Ambulatory Visit (HOSPITAL_BASED_OUTPATIENT_CLINIC_OR_DEPARTMENT_OTHER): Payer: PRIVATE HEALTH INSURANCE | Attending: Orthopedic Surgery | Admitting: Physical Therapy

## 2021-09-12 ENCOUNTER — Other Ambulatory Visit: Payer: Self-pay

## 2021-09-12 DIAGNOSIS — M6281 Muscle weakness (generalized): Secondary | ICD-10-CM | POA: Insufficient documentation

## 2021-09-12 DIAGNOSIS — M25611 Stiffness of right shoulder, not elsewhere classified: Secondary | ICD-10-CM | POA: Diagnosis present

## 2021-09-12 DIAGNOSIS — M25511 Pain in right shoulder: Secondary | ICD-10-CM | POA: Insufficient documentation

## 2021-09-12 NOTE — Therapy (Signed)
?OUTPATIENT PHYSICAL THERAPY TREATMENT ? ? ?Patient Name: Grace Hawkins ?MRN: [id number] ?DOB:May 28, 1976, 46 y.o., female ?Today's Date: 09/13/2021 ? ? PT End of Session - 09/12/21 1311   ? ? Visit Number 7   ? Number of Visits 18   ? Date for PT Re-Evaluation 10/09/21   ? PT Start Time 1306   ? PT Stop Time [id number]   ? PT Time Calculation (min) 41 min   ? Activity Tolerance Patient tolerated treatment well   ? Behavior During Therapy [facility] for tasks assessed/performed   ? ?  ?  ? ?  ? ? ? ? ? ? ? ?Past Medical History:  ?Diagnosis Date  ? Allergy   ? Elevated liver function tests 11/23/2013  ? Hypothyroid   ? Pyelonephritis 11/23/2013  ? ?History reviewed. No pertinent surgical history. ?Patient Active Problem List  ? Diagnosis Date Noted  ? Bulging lumbar disc 05/04/2020  ? Class 3 obesity ([place]) 04/04/2019  ? Hyperlipidemia, mixed 09/16/2018  ? Type 2 diabetes mellitus without complications ([place]) 123XX123  ? Hypothyroid   ? ? ?PCP: Susy Frizzle, MD ? ?REFERRING PROVIDER: Tania Ade, MD ? ?REFERRING DIAG: Right shoulder pain  ? ?THERAPY DIAG:  ?Acute pain of right shoulder ? ?Muscle weakness (generalized) ? ?Decreased right shoulder range of motion ? ? ?ONSET DATE: October 2022 ? ?SUBJECTIVE:                                                                                                                                                                                     ? ?SUBJECTIVE STATEMENT: ?Pt reports improved pain since injection but is wearing off.  Pt reports compliance with HEP.  Pt is limited with lifting objects and limited in lifting R UE.  Pt has pain and limitations with washing and combing hair and hanging up clothes in closet.  Pt states she had some upper back pain after prior Rx though not bad.  Pt denies any increased shoulder pain after prior Rx.  Pt reports she had the stomach bug last weekend.    ? ? ?PERTINENT HISTORY: ?N/A ? ?PAIN:  ?Are you having pain? Yes ?NPRS scale: 1/10 ?Pain  location: lateral upper arm ?Pain orientation: Right  ?PAIN TYPE: dull and sharp ?Pain description: intermittent and constant  ?Aggravating factors: lifting, sleeping with arm under pillow, dressing/undressing, reaching, grooming ?Relieving factors: resting, meds  ? 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 ?PATIENT GOALS : Pt states she would like to get rid of pain.  ? ?OBJECTIVE:  ? ?DIAGNOSTIC FINDINGS:  ?FINDINGS: ?Rotator cuff: There is mild tendinosis of the distal supraspinatus ?and infraspinatus tendons. There is a low-grade,  partial width ?articular-sided tear of the infraspinatus tendon at the footprint, ?width measuring 8 mm (sagittal T2 image 17). Teres minor tendon is ?intact. Subscapularis tendon is intact. ?  ?Muscles: No significant muscle atrophy. ?  ?Biceps Long Head: Intraarticular and extraarticular portions of the ?biceps tendon are intact. ?  ?Acromioclavicular Joint: Moderate arthropathy of the ?acromioclavicular joint. Minimal subacromial/subdeltoid bursal ?fluid. 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 ?  ?Glenohumeral Joint: No significant joint effusion. No chondral ?defect. ?  ?Labrum: Likely normal variant sublabral recess/foramen of the ?anterior superior labrum. There is no other evidence of labral tear. ?  ?Bones: No fracture or dislocation. No aggressive osseous lesion. ?  ?Other: No fluid collection or hematoma. There is no abnormal ?enhancement. No lymphadenopathy. ?  ?IMPRESSION: ?1. Mild distal supraspinatus and infraspinatus tendinosis, with ?low-grade, partial width articular-sided tear of the infraspinatus ?tendon at the footprint. Mild subacromial-subdeltoid bursitis. ?2. 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 ?3. No evidence of labral tear.  Intact biceps tendon. ? ?PATIENT SURVEYS:  ?FOTO 1  ? ?20 at D/C ?5 pt MCII ? ?FOTO 5th visit 2/20 55 ?  ?TODAY'S TREATMENT:  ? ? ?Therapeutic  Exercise:   ?-Reviewed current function, pain level, response to prior Rx, and HEP compliance. ?-Pt performed: ?Supine serratus punches 2 sets of 10 reps ?Prone extension 2x10 reps ?Prone row  2x15 reps ?Pulley's flexion and scaption 2 mins each ?Rows with retraction with RTB 2x10 reps ?-Reviewed HEP ? ?Neuro Re-ed Activities: ?Supine rhythmic stabs 2x30 sec each at 90 and 60 deg ?Supine shoulder ABC x 1 rep ?Wall ball circles 20x CW and CCW, up/down, and s/s ? ?Manual Therapy:  ?Pt received gentle distraction with oscillation and Grade II post and inf Jobs to improve pain, stiffness, and to normalize arthrokinematics. ?Pt received R shoulder flexion, scaption, and ER PROM per tissue tolerance and pt tolerance.  ? ? ?PATIENT EDUCATION: ?Education details: Updated HEP with Supine Shoulder Alphabet and educated pt in correct form and appropriate frequency.  Reviewed HEP. Exercise form ?Person educated: Patient ?Education method: Explanation, Demonstration, Tactile cues, Verbal cues, and Handouts ?Education comprehension: verbalized understanding, returned demonstration, verbal cues required, and tactile cues required ? ? ?HOME EXERCISE PROGRAM: ?Access Code: [email or web address] ?URL: [email or web address] ?Date: 07/11/2021 ?Prepared by: Daleen Bo ? ?Added to HEP: Supine Shoulder Alphabet - 1 x daily - 7 x weekly - 1 reps ? ? ? ?ASSESSMENT: ? ?CLINICAL IMPRESSION: ?Pt continues to report improvement since injection though states it is wearing off.  Pt tolerated exercises well and seems to have improved tolerance to exercises overall though does have some pain with exercises including prone exercises.  She tolerated PROM well.  Pt is still working.  Pt responded well to Rx having no c/o's after Rx.  Patient should benefit from continued skilled PT to address goals and impairments and improve overall function  ? ?Objective impairments include decreased ROM, decreased strength, hypomobility, increased edema, increased  muscle spasms, impaired flexibility, impaired UE functional use, improper body mechanics, postural dysfunction, obesity, and pain. These impairments are limiting patient from cleaning, community activity, driving, occupation, Medical sales representative, yard work, shopping, and Naval architect . Personal factors including Age, Behavior pattern, Fitness, Profession, Time since onset of injury/illness/exacerbation, and 1-2 comorbidities:    are also affecting patient's functional outcome.  ? ?REHAB POTENTIAL: Good ? ?CLINICAL DECISION MAKING: Stable/uncomplicated ? ?EVALUATION COMPLEXITY: Low ? ? ?GOALS: ? ? ?SHORT TERM GOALS: ? ?STG Name  Target Date Goal status  ?1 Pt will become independent with HEP in order to demonstrate synthesis of PT education. ? ? ?Baseline:  10/25/2021 INITIAL  ?2 Pt will score at least 5 pt increase on FOTO to demonstrate functional improvement in MCII and pt perceived function.  ? ?Baseline:  10/25/2021 INITIAL  ?3 Pt will report at least 2 pt reduction on NPRS scale for pain in order to demonstrate functional improvement with household activity, self care, and ADL. ? ?Baseline: 10/25/2021 INITIAL  ?4 Pt will be able to demonstrate BHB reach and OH reach without pain in order to demonstrate functional improvement in UEfunction for self-care and house hold duties. ? ?Baseline: 10/25/2021 INITIAL  ? ?LONG TERM GOALS:  ? ?LTG Name Target Date Goal status  ?1 Pt  will become independent with final HEP in order to demonstrate synthesis of PT education. ? ?Baseline: 12/06/2021 INITIAL  ?2 Pt will be able to demonstrate ability to reach and remove item from high shelf in order to demonstrate functional improvement in UE function for self-care and house hold duties.  ?Baseline: 12/06/2021 INITIAL  ?3 Pt will be able to reach [facility] and carry/hold >5 lbs in order to demonstrate functional improvement in L UE strength for return to PLOF and exercise.  ?Baseline: 12/06/2021 INITIAL  ?4 Pt will score >/= 69 on FOTO to demonstrate  improvement in perceived R shoulder function.  ?Baseline: 12/06/2021 INITIAL  ? ?PLAN: ?PT FREQUENCY: 1-2x/week ? ?PT DURATION: 12 weeks (likely d/c by 8 weeks) ? ?PLANNED INTERVENTIONS: Therapeutic exercises

## 2021-09-13 ENCOUNTER — Encounter (HOSPITAL_BASED_OUTPATIENT_CLINIC_OR_DEPARTMENT_OTHER): Payer: Self-pay | Admitting: Physical Therapy

## 2021-09-18 ENCOUNTER — Other Ambulatory Visit (HOSPITAL_BASED_OUTPATIENT_CLINIC_OR_DEPARTMENT_OTHER): Payer: Self-pay

## 2021-09-18 ENCOUNTER — Other Ambulatory Visit: Payer: Self-pay | Admitting: Family Medicine

## 2021-09-19 ENCOUNTER — Other Ambulatory Visit: Payer: Self-pay

## 2021-09-19 ENCOUNTER — Other Ambulatory Visit (HOSPITAL_BASED_OUTPATIENT_CLINIC_OR_DEPARTMENT_OTHER): Payer: Self-pay

## 2021-09-19 ENCOUNTER — Ambulatory Visit (HOSPITAL_BASED_OUTPATIENT_CLINIC_OR_DEPARTMENT_OTHER): Payer: PRIVATE HEALTH INSURANCE | Attending: Orthopedic Surgery | Admitting: Physical Therapy

## 2021-09-19 DIAGNOSIS — M6281 Muscle weakness (generalized): Secondary | ICD-10-CM | POA: Diagnosis present

## 2021-09-19 DIAGNOSIS — M25611 Stiffness of right shoulder, not elsewhere classified: Secondary | ICD-10-CM | POA: Diagnosis present

## 2021-09-19 DIAGNOSIS — M25511 Pain in right shoulder: Secondary | ICD-10-CM

## 2021-09-19 MED ORDER — PANTOPRAZOLE SODIUM 40 MG PO TBEC
40.0000 mg | DELAYED_RELEASE_TABLET | Freq: Two times a day (BID) | ORAL | 3 refills | Status: DC
  Filled 2021-09-19: qty 60, 30d supply, fill #0
  Filled 2021-10-21: qty 60, 30d supply, fill #1
  Filled 2021-12-23: qty 60, 30d supply, fill #2
  Filled 2022-01-17: qty 60, 30d supply, fill #3

## 2021-09-19 NOTE — Therapy (Addendum)
?OUTPATIENT PHYSICAL THERAPY TREATMENT / RECERTIFICATION ? ? ?Patient Name: Grace Hawkins ?MRN: [id number] ?DOB:11/01/1975, 46 y.o., female ?Today's Date: 09/20/2021 ? ? PT End of Session - 09/19/21 1042   ? ? Visit Number 8   ? Number of Visits 20   ? Date for PT Re-Evaluation 10/31/21   ? Authorization Type [place]   ? PT Start Time 1025   ? PT Stop Time 1107   ? PT Time Calculation (min) 42 min   ? Activity Tolerance Patient tolerated treatment well   ? Behavior During Therapy [facility] for tasks assessed/performed   ? ?  ?  ? ?  ? ? ? ? ? ? ? ? ?Past Medical History:  ?Diagnosis Date  ? Allergy   ? Elevated liver function tests 11/23/2013  ? Hypothyroid   ? Pyelonephritis 11/23/2013  ? ?History reviewed. No pertinent surgical history. ?Patient Active Problem List  ? Diagnosis Date Noted  ? Bulging lumbar disc 05/04/2020  ? Class 3 obesity ([place]) 04/04/2019  ? Hyperlipidemia, mixed 09/16/2018  ? Type 2 diabetes mellitus without complications ([place]) 58/85/0277  ? Hypothyroid   ? ? ?PCP: Susy Frizzle, MD ? ?REFERRING PROVIDER: Sheryle Hail, PA-C ? ?REFERRING DIAG: Right shoulder pain, R shoulder partial RTC tear ? ?THERAPY DIAG:  ?Acute pain of right shoulder ? ?Muscle weakness (generalized) ? ?Decreased right shoulder range of motion ? ? ?ONSET DATE: October 2022 ? ?SUBJECTIVE:                                                                                                                                                                                     ? ?SUBJECTIVE STATEMENT: ?Pt states "I think the therapy has helped".  Pt reports improved sx's since the injection and she can do more with PT since the injection.  Her worst pain is reaching backward whether shoulder in ER or IR.  Pt states the injection is wearing off.  Pt reports compliance with HEP. ? ?RESPONSE TO PRIOR RX:  Pt denies any adverse effects after prior Rx, just soreness.  ?FUNCTIONAL IMPROVEMENTS:  Pt reports improved mobility since  receiving injection.  Household chores, dressing, washing and brushing hair though still has pain.    ?FUNCTIONAL LIMITATIONS:  reaching overhead into a cabinet, reaching backward, fastening bra, lifting patients, unable to throw a softball, hanging up clothes in closet ? ? ?PERTINENT HISTORY: ?N/A ? ?PAIN:  ?Are you having pain? Yes ?NPRS scale: 2/10 current, 6-7/10 worst, 0/10 best ?Pain location: lateral upper arm ?Pain orientation: Right  ?PAIN TYPE: dull and sharp ?Pain description: intermittent and constant  ?Aggravating factors: lifting, sleeping with arm under pillow, dressing/undressing,  reaching, grooming ?Relieving factors: resting, meds  ? 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 ?PATIENT GOALS : Pt states she would like to get rid of pain.  ? ?OBJECTIVE:  ? ?DIAGNOSTIC FINDINGS:  ?FINDINGS: ?Rotator cuff: There is mild tendinosis of the distal supraspinatus ?and infraspinatus tendons. There is a low-grade, partial width ?articular-sided tear of the infraspinatus tendon at the footprint, ?width measuring 8 mm (sagittal T2 image 17). Teres minor tendon is ?intact. Subscapularis tendon is intact. ?  ?Muscles: No significant muscle atrophy. ?  ?Biceps Long Head: Intraarticular and extraarticular portions of the ?biceps tendon are intact. ?  ?Acromioclavicular Joint: Moderate arthropathy of the ?acromioclavicular joint. Minimal subacromial/subdeltoid bursal ?fluid. 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 ?  ?Glenohumeral Joint: No significant joint effusion. No chondral ?defect. ?  ?Labrum: Likely normal variant sublabral recess/foramen of the ?anterior superior labrum. There is no other evidence of labral tear. ?  ?Bones: No fracture or dislocation. No aggressive osseous lesion. ?  ?Other: No fluid collection or hematoma. There is no abnormal ?enhancement. No lymphadenopathy. ?  ?IMPRESSION: ?1. Mild distal supraspinatus and infraspinatus tendinosis, with ?low-grade, partial  width articular-sided tear of the infraspinatus ?tendon at the footprint. Mild subacromial-subdeltoid bursitis. ?2. 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 ?3. No evidence of labral tear.  Intact biceps tendon. ? ?PATIENT SURVEYS:  ?FOTO 55  with a goal of 16 at visit #11 ? ? ?  ?TODAY'S TREATMENT:  ? ?UPPER EXTREMITY AROM/PROM: ?  ?A/PROM Right ?07/11/2021 Left ?07/11/2021  ?Shoulder flexion 160 p! At 110  (2/10 pain) WFL  ?Shoulder extension 40 WFL  ?Shoulder abduction 128 with 3-4/10 pain WFL  ?Shoulder adduction      ?Shoulder internal rotation 68 WFL  ?Shoulder external rotation 48/48 WFL  ?(Blank rows = not tested) ?  ? ? ?Shoulder Strength:   ? Flex:  4/5 with pain ? Scaption:  tolerates minimal resistance and painful ? ER:  tolerates minimal resistance with pain ?         IR:  4-/5 ? ? ? ?Therapeutic Exercise:   ?-Reviewed current function, pain level, response to prior Rx, and HEP compliance. ?-Assessed shoulder ROM and strength ?Pt performed: ?Pulley's flexion and scaption 2 mins each ? ? ?Neuro Re-ed Activities: ?Supine rhythmic stabs 2x30 sec each at 90 and 60 deg ?Supine ER/IR rhythmic stab's at 30 deg abd 2x30 sec ?Supine shoulder ABC x 1 rep ?Wall ball circles 20x CW and CCW, up/down, and s/s ? ? ? ? ?PATIENT EDUCATION: ?Education details: Reviewed HEP. Exercise form ?Person educated: Patient ?Education method: Explanation, Demonstration, Tactile cues, Verbal cues, and Handouts ?Education comprehension: verbalized understanding, returned demonstration, verbal cues required, and tactile cues required ? ? ?HOME EXERCISE PROGRAM: ?Access Code: [email or web address] ?URL: [email or web address] ?Date: 07/11/2021 ?Prepared by: Daleen Bo ? ?Added to HEP: Supine Shoulder Alphabet - 1 x daily - 7 x weekly - 1 reps ? ? ? ?ASSESSMENT: ? ?CLINICAL IMPRESSION: ?Pt reports improved sx's since receiving the injection including improved tolerance to exercises.   She is able to perform and tolerate therapy exercises much better since the injection.  Though she reports improved sx's and function, she states the injection is wearing off.  Pt reports improved performance of ADLs and IADLs including household chores and hair care though does still have pain.  Pt continues to be limited with overhead and reaching activities and has pain with lifting patients at work. Pt  demonstrates improved R shoulder AROM and is limited with ER.  She has weakness t/o R shoulder.  ? ? ?continues to report improvement since injection though states it is wearing off.  Pt tolerated exercises well and seems to have improved tolerance to exercises overall though does have some pain with exercises including prone exercises.  She tolerated PROM well.  Pt is still working.  Pt responded well to Rx having no c/o's after Rx.  Patient should benefit from continued skilled PT to address goals and impairments and improve overall function  ? ?Objective impairments include decreased ROM, decreased strength, hypomobility, increased edema, increased muscle spasms, impaired flexibility, impaired UE functional use, improper body mechanics, postural dysfunction, obesity, and pain. These impairments are limiting patient from cleaning, community activity, driving, occupation, Medical sales representative, yard work, shopping, and Naval architect . Personal factors including Age, Behavior pattern, Fitness, Profession, Time since onset of injury/illness/exacerbation, and 1-2 comorbidities:    are also affecting patient's functional outcome.  ? ?REHAB POTENTIAL: Good ? ?CLINICAL DECISION MAKING: Stable/uncomplicated ? ?EVALUATION COMPLEXITY: Low ? ? ?GOALS: ? ? ?SHORT TERM GOALS: ? ?STG Name Target Date Goal status  ?1 Pt will become independent with HEP in order to demonstrate synthesis of PT education. ? ? ?Baseline:  11/01/2021 GOAL MET  ?2 Pt will score at least 5 pt increase on FOTO to demonstrate functional improvement in MCII and  pt perceived function.  ? ?Baseline:  10/10/2021 ? NOT MET  ?3 Pt will report at least 2 pt reduction on NPRS scale for pain in order to demonstrate functional improvement with household activity, self care, a

## 2021-09-20 ENCOUNTER — Encounter (HOSPITAL_BASED_OUTPATIENT_CLINIC_OR_DEPARTMENT_OTHER): Payer: Self-pay | Admitting: Physical Therapy

## 2021-09-23 ENCOUNTER — Ambulatory Visit (HOSPITAL_BASED_OUTPATIENT_CLINIC_OR_DEPARTMENT_OTHER): Payer: PRIVATE HEALTH INSURANCE | Attending: Orthopedic Surgery | Admitting: Physical Therapy

## 2021-09-23 ENCOUNTER — Other Ambulatory Visit (HOSPITAL_BASED_OUTPATIENT_CLINIC_OR_DEPARTMENT_OTHER): Payer: Self-pay

## 2021-09-23 ENCOUNTER — Other Ambulatory Visit: Payer: Self-pay

## 2021-09-23 ENCOUNTER — Encounter (HOSPITAL_BASED_OUTPATIENT_CLINIC_OR_DEPARTMENT_OTHER): Payer: Self-pay | Admitting: Physical Therapy

## 2021-09-23 DIAGNOSIS — M6281 Muscle weakness (generalized): Secondary | ICD-10-CM

## 2021-09-23 DIAGNOSIS — M25511 Pain in right shoulder: Secondary | ICD-10-CM | POA: Diagnosis not present

## 2021-09-23 DIAGNOSIS — M25611 Stiffness of right shoulder, not elsewhere classified: Secondary | ICD-10-CM | POA: Diagnosis present

## 2021-09-23 NOTE — Therapy (Signed)
?OUTPATIENT PHYSICAL THERAPY TREATMENT  ? ? ?Patient Name: Grace Hawkins ?MRN: [id number] ?DOB:February 15, 1976, 46 y.o., female ?Today's Date: 09/23/2021 ? ? PT End of Session - 09/23/21 0856   ? ? Visit Number 9   ? Number of Visits 20   ? Date for PT Re-Evaluation 10/31/21   ? Authorization Type [place]   ? PT Start Time [phone]   ? PT Stop Time 0930   ? PT Time Calculation (min) 38 min   ? Activity Tolerance Patient tolerated treatment well   ? Behavior During Therapy [facility] for tasks assessed/performed   ? ?  ?  ? ?  ? ? ? ? ? ? ? ? ?Past Medical History:  ?Diagnosis Date  ? Allergy   ? Elevated liver function tests 11/23/2013  ? Hypothyroid   ? Pyelonephritis 11/23/2013  ? ?History reviewed. No pertinent surgical history. ?Patient Active Problem List  ? Diagnosis Date Noted  ? Bulging lumbar disc 05/04/2020  ? Class 3 obesity ([place]) 04/04/2019  ? Hyperlipidemia, mixed 09/16/2018  ? Type 2 diabetes mellitus without complications ([place]) 86/76/7209  ? Hypothyroid   ? ? ?PCP: Susy Frizzle, MD ? ?REFERRING PROVIDER: Sheryle Hail, PA-C ? ?REFERRING DIAG: Right shoulder pain, R shoulder partial RTC tear ? ?THERAPY DIAG:  ?Acute pain of right shoulder ? ?Muscle weakness (generalized) ? ?Decreased right shoulder range of motion ? ? ?ONSET DATE: October 2022 ? ?SUBJECTIVE:                                                                                                                                                                                     ? ?SUBJECTIVE STATEMENT: ?Pt states "It's stiff."  Her worst pain is reaching backward whether shoulder in ER or IR.  Pt reports compliance with HEP. ? ?RESPONSE TO PRIOR RX:  Pt reports she was sore after Rx though had no adverse effects after prior Rx. ?FUNCTIONAL IMPROVEMENTS:  Pt reports improved mobility since receiving injection.  Household chores, dressing, washing and brushing hair though still has pain.    ?FUNCTIONAL LIMITATIONS:  reaching overhead into a cabinet,  reaching backward, fastening bra, lifting patients, unable to throw a softball, hanging up clothes in closet ? ? ?PERTINENT HISTORY: ?N/A ? ?PAIN:  ?Are you having pain? Yes ?NPRS scale: 2/10 current, 6-7/10 worst, 0/10 best ?Pain location: lateral upper arm ?Pain orientation: Right  ?PAIN TYPE: dull and sharp ?Pain description: intermittent and constant  ?Aggravating factors: lifting, sleeping with arm under pillow, dressing/undressing, reaching, grooming ?Relieving factors: resting, meds  ? 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 ?PATIENT GOALS : Pt states she would like to get rid of  pain.  ? ?OBJECTIVE:  ? ?DIAGNOSTIC FINDINGS:  ?FINDINGS: ?Rotator cuff: There is mild tendinosis of the distal supraspinatus ?and infraspinatus tendons. There is a low-grade, partial width ?articular-sided tear of the infraspinatus tendon at the footprint, ?width measuring 8 mm (sagittal T2 image 17). Teres minor tendon is ?intact. Subscapularis tendon is intact. ?  ?Muscles: No significant muscle atrophy. ?  ?Biceps Long Head: Intraarticular and extraarticular portions of the ?biceps tendon are intact. ?  ?Acromioclavicular Joint: Moderate arthropathy of the ?acromioclavicular joint. Minimal subacromial/subdeltoid bursal ?fluid. 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 ?  ?Glenohumeral Joint: No significant joint effusion. No chondral ?defect. ?  ?Labrum: Likely normal variant sublabral recess/foramen of the ?anterior superior labrum. There is no other evidence of labral tear. ?  ?Bones: No fracture or dislocation. No aggressive osseous lesion. ?  ?Other: No fluid collection or hematoma. There is no abnormal ?enhancement. No lymphadenopathy. ?  ?IMPRESSION: ?1. Mild distal supraspinatus and infraspinatus tendinosis, with ?low-grade, partial width articular-sided tear of the infraspinatus ?tendon at the footprint. Mild subacromial-subdeltoid bursitis. ?2. Moderate AC joint arthropathy. Os acromiale  with mild ?degenerative change of the synchondrosis. ?3. No evidence of labral tear.  Intact biceps tendon. ? ? ? ? ?  ?TODAY'S TREATMENT:  ? ? ?Therapeutic Exercise:   ?-Reviewed current function, pain level, response to prior Rx, and HEP compliance. ?Pt performed: ? Pulley's flexion and scaption 2 mins each ?  Pt attempted prone row though PT had pt stop due to pain ?  Prone extension x10 reps ?  Standing rows with retraction RTB 2x10 reps ?  Standing extension with RTB 2x10 reps ? ? ?Neuro Re-ed Activities: ?Supine rhythmic stabs 2x30 sec each at 90 and 60 deg ?Supine ER/IR rhythmic stab's at 30 deg abd 3x30 sec ?Supine shoulder ABC x 1 rep ? ?Manual Therapy:  ?Pt received gentle distraction with oscillation and Grade II post and inf Jobs to improve pain, stiffness, and to normalize arthrokinematics. ?Pt received R shoulder flexion, scaption, and ER PROM per tissue tolerance and pt tolerance ? ? ? ? ? ?PATIENT EDUCATION: ?Education details: Reviewed HEP. Exercise form ?Person educated: Patient ?Education method: Explanation, Demonstration, Tactile cues, Verbal cues, and Handouts ?Education comprehension: verbalized understanding, returned demonstration, verbal cues required, and tactile cues required ? ? ?HOME EXERCISE PROGRAM: ?Access Code: [email or web address] ?URL: [email or web address] ?Date: 07/11/2021 ?Prepared by: Daleen Bo ? ?Added to HEP: Supine Shoulder Alphabet - 1 x daily - 7 x weekly - 1 reps ? ? ? ?ASSESSMENT: ? ?CLINICAL IMPRESSION: ?Pt has improved mobility and tolerance with activity and exercises since receiving injection though has reported it is wearing off.  Pt was guarded with PROM requiring cuing to relax R UE.  Pt is limited with ER ROM.  She had pain with prone exercises and PT had pt stop prone row and only perform 1 set of prone extension due to having pain.  Pt reports 5/10 pain with prone row and 3/10 pain with prone extension.  Pt felt better with standing exercises with theraband  than prone exercises.  Pt gave good effort with all exercises and had no c/o's after Rx.   Patient should benefit from continued skilled PT to address goals and impairments and improve overall function. ?   ? ?Objective impairments include decreased ROM, decreased strength, hypomobility, increased edema, increased muscle spasms, impaired flexibility, impaired UE functional use, improper body mechanics, postural dysfunction, obesity, and pain. These impairments are limiting patient from cleaning, community  activity, driving, occupation, laundry, yard work, shopping, and Naval architect . Personal factors including Age, Behavior pattern, Fitness, Profession, Time since onset of injury/illness/exacerbation, and 1-2 comorbidities:    are also affecting patient's functional outcome.  ? ?REHAB POTENTIAL: Good ? ?CLINICAL DECISION MAKING: Stable/uncomplicated ? ?EVALUATION COMPLEXITY: Low ? ? ?GOALS: ? ? ?SHORT TERM GOALS: ? ?STG Name Target Date Goal status  ?1 Pt will become independent with HEP in order to demonstrate synthesis of PT education. ? ? ?Baseline:  11/04/2021 GOAL MET  ?2 Pt will score at least 5 pt increase on FOTO to demonstrate functional improvement in MCII and pt perceived function.  ? ?Baseline:  10/10/2021 ? NOT MET  ?3 Pt will report at least 2 pt reduction on NPRS scale for pain in order to demonstrate functional improvement with household activity, self care, and ADL. ? ?Baseline: 10/03/2021 PARTIALLY MET  ?4 Pt will be able to demonstrate BHB reach and OH reach without pain in order to demonstrate functional improvement in UEfunction for self-care and house hold duties. ? ?Baseline: 10/10/2021 NOT MET  ? ?LONG TERM GOALS:  ? ?LTG Name Target Date Goal status  ?1 Pt  will become independent with final HEP in order to demonstrate synthesis of PT education. ? ?Baseline: 10/31/2021 PROGRESSING  ?2 Pt will be able to demonstrate ability to reach and remove item from high shelf in order to demonstrate  functional improvement in UE function for self-care and house hold duties.  ?Baseline: 10/31/2021 NOT MET  ?3 Pt will be able to reach [facility] and carry/hold >5 lbs in order to demonstrate functional improvement in

## 2021-09-26 ENCOUNTER — Ambulatory Visit (HOSPITAL_BASED_OUTPATIENT_CLINIC_OR_DEPARTMENT_OTHER): Payer: Worker's Compensation | Admitting: Physical Therapy

## 2021-09-26 NOTE — Therapy (Incomplete)
?OUTPATIENT PHYSICAL THERAPY TREATMENT  ? ? ?Patient Name: Grace Hawkins ?MRN: [id number] ?DOB:Nov 17, 1975, 46 y.o., female ?Today's Date: 09/26/2021 ? ? ? ? ? ? ? ? ? ? ?Past Medical History:  ?Diagnosis Date  ? Allergy   ? Elevated liver function tests 11/23/2013  ? Hypothyroid   ? Pyelonephritis 11/23/2013  ? ?No past surgical history on file. ?Patient Active Problem List  ? Diagnosis Date Noted  ? Bulging lumbar disc 05/04/2020  ? Class 3 obesity ([place]) 04/04/2019  ? Hyperlipidemia, mixed 09/16/2018  ? Type 2 diabetes mellitus without complications ([place]) 94/17/4081  ? Hypothyroid   ? ? ?PCP: Susy Frizzle, MD ? ?REFERRING PROVIDER: Sheryle Hail, PA-C ? ?REFERRING DIAG: Right shoulder pain, R shoulder partial RTC tear ? ?THERAPY DIAG:  ?No diagnosis found. ? ? ?ONSET DATE: October 2022 ? ?SUBJECTIVE:                                                                                                                                                                                     ? ?SUBJECTIVE STATEMENT: ?Pt states "It's stiff."  Her worst pain is reaching backward whether shoulder in ER or IR.  Pt reports compliance with HEP. ? ?RESPONSE TO PRIOR RX:  Pt reports she was sore after Rx though had no adverse effects after prior Rx. ?FUNCTIONAL IMPROVEMENTS:  Pt reports improved mobility since receiving injection.  Household chores, dressing, washing and brushing hair though still has pain.    ?FUNCTIONAL LIMITATIONS:  reaching overhead into a cabinet, reaching backward, fastening bra, lifting patients, unable to throw a softball, hanging up clothes in closet ? ? ?PERTINENT HISTORY: ?N/A ? ?PAIN:  ?Are you having pain? Yes ?NPRS scale: 2/10 current, 6-7/10 worst, 0/10 best ?Pain location: lateral upper arm ?Pain orientation: Right  ?PAIN TYPE: dull and sharp ?Pain description: intermittent and constant  ?Aggravating factors: lifting, sleeping with arm under pillow, dressing/undressing, reaching,  grooming ?Relieving factors: resting, meds  ? 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 ?PATIENT GOALS : Pt states she would like to get rid of pain.  ? ?OBJECTIVE:  ? ?DIAGNOSTIC FINDINGS:  ?FINDINGS: ?Rotator cuff: There is mild tendinosis of the distal supraspinatus ?and infraspinatus tendons. There is a low-grade, partial width ?articular-sided tear of the infraspinatus tendon at the footprint, ?width measuring 8 mm (sagittal T2 image 17). Teres minor tendon is ?intact. Subscapularis tendon is intact. ?  ?Muscles: No significant muscle atrophy. ?  ?Biceps Long Head: Intraarticular and extraarticular portions of the ?biceps tendon are intact. ?  ?Acromioclavicular Joint: Moderate arthropathy of the ?acromioclavicular joint. Minimal subacromial/subdeltoid bursal ?fluid. 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  synchondrosis. ?  ?Glenohumeral Joint: No significant joint effusion. No chondral ?defect. ?  ?Labrum: Likely normal variant sublabral recess/foramen of the ?anterior superior labrum. There is no other evidence of labral tear. ?  ?Bones: No fracture or dislocation. No aggressive osseous lesion. ?  ?Other: No fluid collection or hematoma. There is no abnormal ?enhancement. No lymphadenopathy. ?  ?IMPRESSION: ?1. Mild distal supraspinatus and infraspinatus tendinosis, with ?low-grade, partial width articular-sided tear of the infraspinatus ?tendon at the footprint. Mild subacromial-subdeltoid bursitis. ?2. 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 ?3. No evidence of labral tear.  Intact biceps tendon. ? ? ? ? ?  ?TODAY'S TREATMENT:  ? ? ?Therapeutic Exercise:   ?-Reviewed current function, pain level, response to prior Rx, and HEP compliance. ?Pt performed: ? Pulley's flexion and scaption 2 mins each ?  Pt attempted prone row though PT had pt stop due to pain ?  Prone extension x10 reps ?  Standing rows with retraction RTB 2x10 reps ?   Standing extension with RTB 2x10 reps ? ? S/L ER? ?T band  ER? ? ? ?Neuro Re-ed Activities: ?Supine rhythmic stabs 2x30 sec each at 90 and 60 deg ?Supine ER/IR rhythmic stab's at 30 deg abd 3x30 sec ?Supine shoulder ABC x 1 rep ?4D Ball on wall ? ?Manual Therapy:  ?Pt received gentle distraction with oscillation and Grade II post and inf Jobs to improve pain, stiffness, and to normalize arthrokinematics. ?Pt received R shoulder flexion, scaption, and ER PROM per tissue tolerance and pt tolerance ? ? ? ? ? ?PATIENT EDUCATION: ?Education details: Reviewed HEP. Exercise form ?Person educated: Patient ?Education method: Explanation, Demonstration, Tactile cues, Verbal cues, and Handouts ?Education comprehension: verbalized understanding, returned demonstration, verbal cues required, and tactile cues required ? ? ?HOME EXERCISE PROGRAM: ?Access Code: [email or web address] ?URL: [email or web address] ?Date: 07/11/2021 ?Prepared by: Daleen Bo ? ?Added to HEP: Supine Shoulder Alphabet - 1 x daily - 7 x weekly - 1 reps ? ? ? ?ASSESSMENT: ? ?CLINICAL IMPRESSION: ?Pt has improved mobility and tolerance with activity and exercises since receiving injection though has reported it is wearing off.  Pt was guarded with PROM requiring cuing to relax R UE.  Pt is limited with ER ROM.  She had pain with prone exercises and PT had pt stop prone row and only perform 1 set of prone extension due to having pain.  Pt reports 5/10 pain with prone row and 3/10 pain with prone extension.  Pt felt better with standing exercises with theraband than prone exercises.  Pt gave good effort with all exercises and had no c/o's after Rx.   Patient should benefit from continued skilled PT to address goals and impairments and improve overall function. ?   ? ?Objective impairments include decreased ROM, decreased strength, hypomobility, increased edema, increased muscle spasms, impaired flexibility, impaired UE functional use, improper body mechanics,  postural dysfunction, obesity, and pain. These impairments are limiting patient from cleaning, community activity, driving, occupation, Medical sales representative, yard work, shopping, and Naval architect . Personal factors including Age, Behavior pattern, Fitness, Profession, Time since onset of injury/illness/exacerbation, and 1-2 comorbidities:    are also affecting patient's functional outcome.  ? ?REHAB POTENTIAL: Good ? ?CLINICAL DECISION MAKING: Stable/uncomplicated ? ?EVALUATION COMPLEXITY: Low ? ? ?GOALS: ? ? ?SHORT TERM GOALS: ? ?STG Name Target Date Goal status  ?1 Pt will become independent with HEP in order to demonstrate synthesis of PT education. ? ? ?Baseline:  11/07/2021 GOAL MET  ?2  Pt will score at least 5 pt increase on FOTO to demonstrate functional improvement in MCII and pt perceived function.  ? ?Baseline:  10/10/2021 ? NOT MET  ?3 Pt will report at least 2 pt reduction on NPRS scale for pain in order to demonstrate functional improvement with household activity, self care, and ADL. ? ?Baseline: 10/03/2021 PARTIALLY MET  ?4 Pt will be able to demonstrate BHB reach and OH reach without pain in order to demonstrate functional improvement in UEfunction for self-care and house hold duties. ? ?Baseline: 10/10/2021 NOT MET  ? ?LONG TERM GOALS:  ? ?LTG Name Target Date Goal status  ?1 Pt  will become independent with final HEP in order to demonstrate synthesis of PT education. ? ?Baseline: 10/31/2021 PROGRESSING  ?2 Pt will be able to demonstrate ability to reach and remove item from high shelf in order to demonstrate functional improvement in UE function for self-care and house hold duties.  ?Baseline: 10/31/2021 NOT MET  ?3 Pt will be able to reach [facility] and carry/hold >5 lbs in order to demonstrate functional improvement in L UE strength for return to PLOF and exercise.  ?Baseline: 10/31/2021 PROGRESSING  ?4 Pt will score >/= 69 on FOTO to demonstrate improvement in perceived R shoulder function.  ?Baseline: 10/31/2021 NOT MET   ?5 Pt will be able to perform her self care activities without difficulty or significant pain 10/31/2021 INITIAL  ? ?PLAN: ?PT FREQUENCY: 2x/wk ? ?PT DURATION: 4-6 weeks ? ?PLANNED INTERVENTIONS: Therapeutic exercises, The

## 2021-09-29 NOTE — Therapy (Signed)
?OUTPATIENT PHYSICAL THERAPY TREATMENT  ? ? ?Patient Name: Grace Hawkins ?MRN: [id number] ?DOB:1975/11/10, 46 y.o., female ?Today's Date: 09/30/2021 ? ? PT End of Session - 09/30/21 0916   ? ? Visit Number 10   ? Number of Visits 20   ? Date for PT Re-Evaluation 10/31/21   ? Authorization Type [place]   ? PT Start Time [phone]   ? PT Stop Time 0936   ? PT Time Calculation (min) 38 min   ? Activity Tolerance Patient tolerated treatment well   ? Behavior During Therapy [facility] for tasks assessed/performed   ? ?  ?  ? ?  ? ? ? ? ? ? ? ? ? ?Past Medical History:  ?Diagnosis Date  ? Allergy   ? Elevated liver function tests 11/23/2013  ? Hypothyroid   ? Pyelonephritis 11/23/2013  ? ?History reviewed. No pertinent surgical history. ?Patient Active Problem List  ? Diagnosis Date Noted  ? Bulging lumbar disc 05/04/2020  ? Class 3 obesity ([place]) 04/04/2019  ? Hyperlipidemia, mixed 09/16/2018  ? Type 2 diabetes mellitus without complications ([place]) 03/50/0938  ? Hypothyroid   ? ? ?PCP: Susy Frizzle, MD ? ?REFERRING PROVIDER: Sheryle Hail, PA-C ? ?REFERRING DIAG: Right shoulder pain, R shoulder partial RTC tear ? ?THERAPY DIAG:  ?Acute pain of right shoulder ? ?Muscle weakness (generalized) ? ?Decreased right shoulder range of motion ? ? ?ONSET DATE: October 2022 ? ?SUBJECTIVE:                                                                                                                                                                                     ? ?SUBJECTIVE STATEMENT: ?Pt states "It's about the same."  Pt reports her mobility has improved though it still hurts. Pt denies pain sitting at rest though has 2-3/10 pain with lifting R UE.  Pt reports it's stiff.  Pain is worse in AM.  Her worst pain is reaching backward whether shoulder in ER or IR.  Pt reports compliance with HEP.  Pt reports no change in work activities.  Pt sees orthopedic MD today.     ? ?RESPONSE TO PRIOR RX:  Pt reports she was sore after Rx  though had no adverse effects after prior Rx. ?FUNCTIONAL IMPROVEMENTS:  Pt reports improved mobility since receiving injection.  Household chores, dressing, washing and brushing hair though still has pain.    ?FUNCTIONAL LIMITATIONS:  reaching overhead into a cabinet, reaching backward, fastening bra, lifting patients, unable to throw a softball, hanging up clothes in closet, reaching high into the microwave and trying to clean the mirror ? ? ?PERTINENT HISTORY: ?N/A ? ?PAIN:  ?Are  you having pain? Yes ?NPRS scale: 2/10 current, 6-7/10 worst, 0/10 best ?Pain location: lateral upper arm ?Pain orientation: Right  ?PAIN TYPE: dull and sharp ?Pain description: intermittent and constant  ?Aggravating factors: lifting, sleeping with arm under pillow, dressing/undressing, reaching, grooming ?Relieving factors: resting, meds  ? 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 ?PATIENT GOALS : Pt states she would like to get rid of pain.  ? ?OBJECTIVE:  ? ?DIAGNOSTIC FINDINGS:  ?FINDINGS: ?Rotator cuff: There is mild tendinosis of the distal supraspinatus ?and infraspinatus tendons. There is a low-grade, partial width ?articular-sided tear of the infraspinatus tendon at the footprint, ?width measuring 8 mm (sagittal T2 image 17). Teres minor tendon is ?intact. Subscapularis tendon is intact. ?  ?Muscles: No significant muscle atrophy. ?  ?Biceps Long Head: Intraarticular and extraarticular portions of the ?biceps tendon are intact. ?  ?Acromioclavicular Joint: Moderate arthropathy of the ?acromioclavicular joint. Minimal subacromial/subdeltoid bursal ?fluid. 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 ?  ?Glenohumeral Joint: No significant joint effusion. No chondral ?defect. ?  ?Labrum: Likely normal variant sublabral recess/foramen of the ?anterior superior labrum. There is no other evidence of labral tear. ?  ?Bones: No fracture or dislocation. No aggressive osseous lesion. ?  ?Other: No fluid  collection or hematoma. There is no abnormal ?enhancement. No lymphadenopathy. ?  ?IMPRESSION: ?1. Mild distal supraspinatus and infraspinatus tendinosis, with ?low-grade, partial width articular-sided tear of the infraspinatus ?tendon at the footprint. Mild subacromial-subdeltoid bursitis. ?2. 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 ?3. No evidence of labral tear.  Intact biceps tendon. ? ? ? ? ?  ?TODAY'S TREATMENT:  ? ? ?Therapeutic Exercise:   ?-Reviewed current function, pain level, response to prior Rx, and HEP compliance. ?Pt performed: ? S/L ER 2x10 reps ?  Standing rows with retraction RTB 2x10 reps ?  Standing extension with RTB 2x10 reps ? ? ?Neuro Re-ed Activities: ?Supine rhythmic stabs 2x30 sec each at 90 and 60 deg ?Supine ER/IR rhythmic stab's at 30 deg abd 3x30 sec ?Supine shoulder ABC with 1#x 1 rep ?4D Ball on wall 2x10 reps ? ?Manual Therapy:  ?Pt received gentle distraction with oscillation and Grade II post and inf Jobs to improve pain, stiffness, and to normalize arthrokinematics. ?Pt received R shoulder flexion, scaption, and ER PROM per tissue tolerance and pt tolerance ? ? ? ? ? ?PATIENT EDUCATION: ?Education details: Reviewed HEP. Exercise form ?Person educated: Patient ?Education method: Explanation, Demonstration, Tactile cues, Verbal cues ?Education comprehension: verbalized understanding, returned demonstration, verbal cues required, and tactile cues required ? ? ?HOME EXERCISE PROGRAM: ?Access Code: [email or web address] ?URL: [email or web address] ?Date: 07/11/2021 ?Prepared by: Daleen Bo ? ?HEP: Supine Shoulder Alphabet - 1 x daily - 7 x weekly - 1 reps ? ? ? ?ASSESSMENT: ? ?CLINICAL IMPRESSION: ?Pt reports improved mobility though continues to have pain with reaching and IADLs.  Pt continues to have pain with activities involving reaching overhead and behind her back or head.  Pt reports no change in work activities.  She  has improved tolerance overall to exercises though is limited due to pain.  Pt was guarded with PROM requiring cuing to relax R UE.  Pt performed exercises well with cuing for correct form.  She responded well to Rx reporting no change in pain though reports improved stiffness after Rx.  Patient should benefit from continued skilled PT to address goals and impairments and improve overall function. ? ?   ? ?Objective  impairments include decreased ROM, decreased strength, hypomobility, increased edema, increased muscle spasms, impaired flexibility, impaired UE functional use, improper body mechanics, postural dysfunction, obesity, and pain. These impairments are limiting patient from cleaning, community activity, driving, occupation, Medical sales representative, yard work, shopping, and Naval architect . Personal factors including Age, Behavior pattern, Fitness, Profession, Time since onset of injury/illness/exacerbation, and 1-2 comorbidities:    are also affecting patient's functional outcome.  ? ?REHAB POTENTIAL: Good ? ?CLINICAL DECISION MAKING: Stable/uncomplicated ? ?EVALUATION COMPLEXITY: Low ? ? ?GOALS: ? ? ?SHORT TERM GOALS: ? ?STG Name Target Date Goal status  ?1 Pt will become independent with HEP in order to demonstrate synthesis of PT education. ? ? ?Baseline:  11/11/2021 GOAL MET  ?2 Pt will score at least 5 pt increase on FOTO to demonstrate functional improvement in MCII and pt perceived function.  ? ?Baseline:  10/10/2021 ? NOT MET  ?3 Pt will report at least 2 pt reduction on NPRS scale for pain in order to demonstrate functional improvement with household activity, self care, and ADL. ? ?Baseline: 10/03/2021 PARTIALLY MET  ?4 Pt will be able to demonstrate BHB reach and OH reach without pain in order to demonstrate functional improvement in UEfunction for self-care and house hold duties. ? ?Baseline: 10/10/2021 NOT MET  ? ?LONG TERM GOALS:  ? ?LTG Name Target Date Goal status  ?1 Pt  will become independent with final  HEP in order to demonstrate synthesis of PT education. ? ?Baseline: 10/31/2021 PROGRESSING  ?2 Pt will be able to demonstrate ability to reach and remove item from high shelf in order to demonstrate functional improv

## 2021-09-30 ENCOUNTER — Ambulatory Visit (HOSPITAL_BASED_OUTPATIENT_CLINIC_OR_DEPARTMENT_OTHER): Payer: PRIVATE HEALTH INSURANCE | Attending: Orthopedic Surgery | Admitting: Physical Therapy

## 2021-09-30 ENCOUNTER — Encounter (HOSPITAL_BASED_OUTPATIENT_CLINIC_OR_DEPARTMENT_OTHER): Payer: Self-pay | Admitting: Physical Therapy

## 2021-09-30 DIAGNOSIS — M25611 Stiffness of right shoulder, not elsewhere classified: Secondary | ICD-10-CM | POA: Insufficient documentation

## 2021-09-30 DIAGNOSIS — M6281 Muscle weakness (generalized): Secondary | ICD-10-CM | POA: Insufficient documentation

## 2021-09-30 DIAGNOSIS — M25511 Pain in right shoulder: Secondary | ICD-10-CM | POA: Insufficient documentation

## 2021-10-02 ENCOUNTER — Other Ambulatory Visit (HOSPITAL_BASED_OUTPATIENT_CLINIC_OR_DEPARTMENT_OTHER): Payer: Self-pay

## 2021-10-02 ENCOUNTER — Ambulatory Visit (HOSPITAL_BASED_OUTPATIENT_CLINIC_OR_DEPARTMENT_OTHER): Payer: PRIVATE HEALTH INSURANCE | Admitting: Physical Therapy

## 2021-10-07 ENCOUNTER — Ambulatory Visit (HOSPITAL_BASED_OUTPATIENT_CLINIC_OR_DEPARTMENT_OTHER): Payer: PRIVATE HEALTH INSURANCE | Admitting: Physical Therapy

## 2021-10-09 ENCOUNTER — Ambulatory Visit (HOSPITAL_BASED_OUTPATIENT_CLINIC_OR_DEPARTMENT_OTHER): Payer: PRIVATE HEALTH INSURANCE | Admitting: Physical Therapy

## 2021-10-17 ENCOUNTER — Ambulatory Visit (HOSPITAL_BASED_OUTPATIENT_CLINIC_OR_DEPARTMENT_OTHER): Payer: PRIVATE HEALTH INSURANCE | Attending: Orthopedic Surgery | Admitting: Physical Therapy

## 2021-10-17 ENCOUNTER — Encounter (HOSPITAL_BASED_OUTPATIENT_CLINIC_OR_DEPARTMENT_OTHER): Payer: Self-pay | Admitting: Physical Therapy

## 2021-10-17 DIAGNOSIS — M25511 Pain in right shoulder: Secondary | ICD-10-CM | POA: Diagnosis present

## 2021-10-17 DIAGNOSIS — M25611 Stiffness of right shoulder, not elsewhere classified: Secondary | ICD-10-CM

## 2021-10-17 DIAGNOSIS — M6281 Muscle weakness (generalized): Secondary | ICD-10-CM

## 2021-10-17 NOTE — Therapy (Signed)
?OUTPATIENT PHYSICAL THERAPY TREATMENT  ? ? ?Patient Name: Grace Hawkins ?MRN: [id number] ?DOB:1976/02/25, 46 y.o., female ?Today's Date: 10/17/2021 ? ? PT End of Session - 10/17/21 1302   ? ? Visit Number 11   ? Number of Visits 20   ? Date for PT Re-Evaluation 10/31/21   ? Authorization Type    ? PT Start Time 1301   ? PT Stop Time 1340   ? PT Time Calculation (min) 39 min   ? Activity Tolerance Patient tolerated treatment well   ? Behavior During Therapy [facility] for tasks assessed/performed   ? ?  ?  ? ?  ? ? ? ? ? ? ? ? ? ? ?Past Medical History:  ?Diagnosis Date  ? Allergy   ? Elevated liver function tests 11/23/2013  ? Hypothyroid   ? Pyelonephritis 11/23/2013  ? ?History reviewed. No pertinent surgical history. ?Patient Active Problem List  ? Diagnosis Date Noted  ? Bulging lumbar disc 05/04/2020  ? Class 3 obesity ([place]) 04/04/2019  ? Hyperlipidemia, mixed 09/16/2018  ? Type 2 diabetes mellitus without complications ([place]) 26/33/3545  ? Hypothyroid   ? ? ?PCP: Susy Frizzle, MD ? ?REFERRING PROVIDER: Sheryle Hail, PA-C ? ?REFERRING DIAG: Right shoulder pain, R shoulder partial RTC tear ? ?THERAPY DIAG:  ?Acute pain of right shoulder ? ?Muscle weakness (generalized) ? ?Decreased right shoulder range of motion ? ? ?ONSET DATE: October 2022 ? ?SUBJECTIVE:                                                                                                                                                                                     ? ?SUBJECTIVE STATEMENT: ? ?Pt states that the MD is considering surgery for the shoulder.  She is going to continue with 6 wks of therapy and then transition over to HEP. Pt states the shoulder pain is about the same. Reaching to 90 is still painful.    ? ?RESPONSE TO PRIOR RX:  Pt reports she was sore after Rx though had no adverse effects after prior Rx. ?FUNCTIONAL IMPROVEMENTS:  Pt reports improved mobility since receiving injection.  Household chores, dressing,  washing and brushing hair though still has pain.    ?FUNCTIONAL LIMITATIONS:  reaching overhead into a cabinet, reaching backward, fastening bra, lifting patients, unable to throw a softball, hanging up clothes in closet, reaching high into the microwave and trying to clean the mirror ? ? ?PERTINENT HISTORY: ?N/A ? ?PAIN:  ?Are you having pain? No ?NPRS scale: 0/10 current, 6-7/10 worst, 0/10 best ?Pain location: lateral upper arm ?Pain orientation: Right  ?PAIN TYPE: dull and sharp ?Pain description: intermittent and constant  ?  Aggravating factors: lifting, sleeping with arm under pillow, dressing/undressing, reaching, grooming ?Relieving factors: resting, meds  ? 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 ?PATIENT GOALS : Pt states she would like to get rid of pain.  ? ?OBJECTIVE:  ? ?DIAGNOSTIC FINDINGS:  ?FINDINGS: ?Rotator cuff: There is mild tendinosis of the distal supraspinatus ?and infraspinatus tendons. There is a low-grade, partial width ?articular-sided tear of the infraspinatus tendon at the footprint, ?width measuring 8 mm (sagittal T2 image 17). Teres minor tendon is ?intact. Subscapularis tendon is intact. ?  ?Muscles: No significant muscle atrophy. ?  ?Biceps Long Head: Intraarticular and extraarticular portions of the ?biceps tendon are intact. ?  ?Acromioclavicular Joint: Moderate arthropathy of the ?acromioclavicular joint. Minimal subacromial/subdeltoid bursal ?fluid. 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 ?  ?Glenohumeral Joint: No significant joint effusion. No chondral ?defect. ?  ?Labrum: Likely normal variant sublabral recess/foramen of the ?anterior superior labrum. There is no other evidence of labral tear. ?  ?Bones: No fracture or dislocation. No aggressive osseous lesion. ?  ?Other: No fluid collection or hematoma. There is no abnormal ?enhancement. No lymphadenopathy. ?  ?IMPRESSION: ?1. Mild distal supraspinatus and infraspinatus tendinosis,  with ?low-grade, partial width articular-sided tear of the infraspinatus ?tendon at the footprint. Mild subacromial-subdeltoid bursitis. ?2. 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 ?3. No evidence of labral tear.  Intact biceps tendon. ? ?  ?TODAY'S TREATMENT:  ? ?4/20: ? ? ?Grade II post and inf Jobs to improve pain, stiffness ?STM R UT and infra ?Pt received R shoulder flexion, scaption, and ER PROM per tissue tolerance and pt tolerance ? ?Supine shoulder ABC 1lb 1x ? ?Levator stretch 30s 3x ?UT stretch 30s 3x ?Post cuff stretch 30s 3x ? ?Bilat YTB ER 2x10  ?Wall ball 20x CW and CCW ?Cane ext 3s 15x ?Wall push up 3x10 ? ? ? ? ?Previous:  ? ?Therapeutic Exercise: ?   ?-Reviewed current function, pain level, response to prior Rx, and HEP compliance. ?Pt performed: ? S/L ER 2x10 reps ?  Standing rows with retraction RTB 2x10 reps ?  Standing extension with RTB 2x10 reps ? ? ?Neuro Re-ed Activities: ?Supine rhythmic stabs 2x30 sec each at 90 and 60 deg ?Supine ER/IR rhythmic stab's at 30 deg abd 3x30 sec ?Supine shoulder ABC with 1#x 1 rep ?4D Ball on wall 2x10 reps ? ?Manual Therapy:  ?Pt received gentle distraction with oscillation and Grade II post and inf Jobs to improve pain, stiffness, and to normalize arthrokinematics. ?Pt received R shoulder flexion, scaption, and ER PROM per tissue tolerance and pt tolerance ? ? ? ?PATIENT EDUCATION: ?Education details: Reviewed HEP. Exercise form ?Person educated: Patient ?Education method: Explanation, Demonstration, Tactile cues, Verbal cues ?Education comprehension: verbalized understanding, returned demonstration, verbal cues required, and tactile cues required ? ? ?HOME EXERCISE PROGRAM: ?Access Code: [email or web address] ?URL: [email or web address] ?Date: 07/11/2021 ?Prepared by: Daleen Bo ? ?HEP: Supine Shoulder Alphabet - 1 x daily - 7 x weekly - 1 reps ? ? ? ?ASSESSMENT: ? ?CLINICAL IMPRESSION: ?Pt is still  largely limited by pain at today's session but able to continue with gentle muscle activation exercise and low level strength. Pt was particularly tight in R periscapular region to palpation as well as with self stretching. Pt advised to trial some gentle stretching for formal addition to HEP. Plan to continue with strength as tolerated as pt may be considering surgical intervention. Patient should benefit from continued skilled  PT to address goals and impairments and improve overall function. ? ?   ? ?Objective impairments include decreased ROM, decreased strength, hypomobility, increased edema, increased muscle spasms, impaired flexibility, impaired UE functional use, improper body mechanics, postural dysfunction, obesity, and pain. These impairments are limiting patient from cleaning, community activity, driving, occupation, Medical sales representative, yard work, shopping, and Naval architect . Personal factors including Age, Behavior pattern, Fitness, Profession, Time since onset of injury/illness/exacerbation, and 1-2 comorbidities:    are also affecting patient's functional outcome.  ? ?REHAB POTENTIAL: Good ? ?CLINICAL DECISION MAKING: Stable/uncomplicated ? ?EVALUATION COMPLEXITY: Low ? ? ?GOALS: ? ? ?SHORT TERM GOALS: ? ?STG Name Target Date Goal status  ?1 Pt will become independent with HEP in order to demonstrate synthesis of PT education. ? ? ?Baseline:  11/28/2021 GOAL MET  ?2 Pt will score at least 5 pt increase on FOTO to demonstrate functional improvement in MCII and pt perceived function.  ? ?Baseline:  10/10/2021 ? NOT MET  ?3 Pt will report at least 2 pt reduction on NPRS scale for pain in order to demonstrate functional improvement with household activity, self care, and ADL. ? ?Baseline: 10/03/2021 PARTIALLY MET  ?4 Pt will be able to demonstrate BHB reach and OH reach without pain in order to demonstrate functional improvement in UEfunction for self-care and house hold duties. ? ?Baseline: 10/10/2021 NOT MET   ? ?LONG TERM GOALS:  ? ?LTG Name Target Date Goal status  ?1 Pt  will become independent with final HEP in order to demonstrate synthesis of PT education. ? ?Baseline: 10/31/2021 PROGRESSING  ?2 Pt will be able to demons

## 2021-10-21 ENCOUNTER — Other Ambulatory Visit: Payer: Self-pay | Admitting: Family Medicine

## 2021-10-21 ENCOUNTER — Ambulatory Visit (HOSPITAL_BASED_OUTPATIENT_CLINIC_OR_DEPARTMENT_OTHER): Payer: PRIVATE HEALTH INSURANCE | Admitting: Physical Therapy

## 2021-10-21 ENCOUNTER — Encounter (HOSPITAL_BASED_OUTPATIENT_CLINIC_OR_DEPARTMENT_OTHER): Payer: Self-pay | Admitting: Physical Therapy

## 2021-10-21 ENCOUNTER — Other Ambulatory Visit (HOSPITAL_BASED_OUTPATIENT_CLINIC_OR_DEPARTMENT_OTHER): Payer: Self-pay

## 2021-10-21 DIAGNOSIS — M25611 Stiffness of right shoulder, not elsewhere classified: Secondary | ICD-10-CM

## 2021-10-21 DIAGNOSIS — M6281 Muscle weakness (generalized): Secondary | ICD-10-CM

## 2021-10-21 DIAGNOSIS — M25511 Pain in right shoulder: Secondary | ICD-10-CM | POA: Diagnosis not present

## 2021-10-21 MED ORDER — SYNTHROID 125 MCG PO TABS
250.0000 ug | ORAL_TABLET | Freq: Every day | ORAL | 1 refills | Status: DC
  Filled 2021-10-21: qty 60, 30d supply, fill #0
  Filled 2021-11-29: qty 60, 30d supply, fill #1

## 2021-10-21 NOTE — Therapy (Signed)
?OUTPATIENT PHYSICAL THERAPY TREATMENT  ? ? ?Patient Name: Grace Hawkins ?MRN: [id number] ?DOB:[birth year], 46 y.o., female ?Today's Date: 10/21/2021 ? ? PT End of Session - 10/21/21 1027   ? ? Visit Number 12   ? Number of Visits 20   ? Date for PT Re-Evaluation 10/31/21   ? Authorization Type [place]   ? PT Start Time 1027   clinic delay  ? PT Stop Time 1055   ? PT Time Calculation (min) 28 min   ? Activity Tolerance Patient tolerated treatment well   ? Behavior During Therapy [facility] for tasks assessed/performed   ? ?  ?  ? ?  ? ? ? ? ? ? ? ? ? ? ?Past Medical History:  ?Diagnosis Date  ? Allergy   ? Elevated liver function tests 11/23/2013  ? Hypothyroid   ? Pyelonephritis 11/23/2013  ? ?History reviewed. No pertinent surgical history. ?Patient Active Problem List  ? Diagnosis Date Noted  ? Bulging lumbar disc 05/04/2020  ? Class 3 obesity ([place]) 04/04/2019  ? Hyperlipidemia, mixed 09/16/2018  ? Type 2 diabetes mellitus without complications ([place]) 09/47/0962  ? Hypothyroid   ? ? ?PCP: Susy Frizzle, MD ? ?REFERRING PROVIDER: Sheryle Hail, PA-C ? ?REFERRING DIAG: Right shoulder pain, R shoulder partial RTC tear ? ?THERAPY DIAG:  ?Acute pain of right shoulder ? ?Muscle weakness (generalized) ? ?Decreased right shoulder range of motion ? ? ?ONSET DATE: October 2022 ? ?SUBJECTIVE:                                                                                                                                                                                     ? ?SUBJECTIVE STATEMENT: ? ?Pt states she was a little sore after session but only lasted about a day. Pt still with trouble reaching above 90 without pain. ? ?RESPONSE TO PRIOR RX:  Pt reports she was sore after Rx though had no adverse effects after prior Rx. ?FUNCTIONAL IMPROVEMENTS:  Pt reports improved mobility since receiving injection.  Household chores, dressing, washing and brushing hair though still has pain.    ?FUNCTIONAL LIMITATIONS:  reaching  overhead into a cabinet, reaching backward, fastening bra, lifting patients, unable to throw a softball, hanging up clothes in closet, reaching high into the microwave and trying to clean the mirror ? ? ?PERTINENT HISTORY: ?N/A ? ?PAIN:  ?Are you having pain? No ?NPRS scale: 0/10 current, 6-7/10 worst, 0/10 best ?Pain location: lateral upper arm ?Pain orientation: Right  ?PAIN TYPE: dull and sharp ?Pain description: intermittent and constant  ?Aggravating factors: lifting, sleeping with arm under pillow, dressing/undressing, reaching, grooming ?Relieving factors: resting, meds  ? 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 ?PATIENT GOALS : Pt states she would like to get rid of pain.  ? ?OBJECTIVE:  ? ?DIAGNOSTIC FINDINGS:  ?FINDINGS: ?Rotator cuff: There is mild tendinosis of the distal supraspinatus ?and infraspinatus tendons. There is a low-grade, partial width ?articular-sided tear of the infraspinatus tendon at the footprint, ?width measuring 8 mm (sagittal T2 image 17). Teres minor tendon is ?intact. Subscapularis tendon is intact. ?  ?Muscles: No significant muscle atrophy. ?  ?Biceps Long Head: Intraarticular and extraarticular portions of the ?biceps tendon are intact. ?  ?Acromioclavicular Joint: Moderate arthropathy of the ?acromioclavicular joint. Minimal subacromial/subdeltoid bursal ?fluid. 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 ?  ?Glenohumeral Joint: No significant joint effusion. No chondral ?defect. ?  ?Labrum: Likely normal variant sublabral recess/foramen of the ?anterior superior labrum. There is no other evidence of labral tear. ?  ?Bones: No fracture or dislocation. No aggressive osseous lesion. ?  ?Other: No fluid collection or hematoma. There is no abnormal ?enhancement. No lymphadenopathy. ?  ?IMPRESSION: ?1. Mild distal supraspinatus and infraspinatus tendinosis, with ?low-grade, partial width articular-sided tear of the infraspinatus ?tendon at the  footprint. Mild subacromial-subdeltoid bursitis. ?2. 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 ?3. No evidence of labral tear.  Intact biceps tendon. ? ?  ?TODAY'S TREATMENT:  ? ?4/20: ? ? ?STM R UT and infra ? ?Wall slide 3s 10x ?Bilat YTB ER 2x10  ?RTB row and ext 2x10 ?Wall push up 3x10 ?UBE L1 1.5 retro and fwd  ? ? ? ? ?Previous:  ? ?Therapeutic Exercise: ?   ?-Reviewed current function, pain level, response to prior Rx, and HEP compliance. ?Pt performed: ? S/L ER 2x10 reps ?  Standing rows with retraction RTB 2x10 reps ?  Standing extension with RTB 2x10 reps ? ? ?Neuro Re-ed Activities: ?Supine rhythmic stabs 2x30 sec each at 90 and 60 deg ?Supine ER/IR rhythmic stab's at 30 deg abd 3x30 sec ?Supine shoulder ABC with 1#x 1 rep ?4D Ball on wall 2x10 reps ? ?Manual Therapy:  ?Pt received gentle distraction with oscillation and Grade II post and inf Jobs to improve pain, stiffness, and to normalize arthrokinematics. ?Pt received R shoulder flexion, scaption, and ER PROM per tissue tolerance and pt tolerance ? ? ? ?PATIENT EDUCATION: ?Education details: Reviewed HEP. Exercise form ?Person educated: Patient ?Education method: Explanation, Demonstration, Tactile cues, Verbal cues ?Education comprehension: verbalized understanding, returned demonstration, verbal cues required, and tactile cues required ? ? ?HOME EXERCISE PROGRAM: ?Access Code: [email or web address] ?URL: [email or web address] ?Date: 10/21/2021 ?Prepared by: Daleen Bo ? ?Exercises ?- Seated Shoulder Flexion Towel Slide at Table Top  - 2 x daily - 7 x weekly - 1 sets - 15 reps - 5 hold ?- Wall Push Up  - 1 x daily - 7 x weekly - 3 sets - 5 reps ?- Shoulder extension with resistance - Neutral  - 1 x daily - 7 x weekly - 5 sets - 5 reps ?- Supine Shoulder Alphabet  - 1 x daily - 7 x weekly - 1 reps ?- Shoulder External Rotation and Scapular Retraction with Resistance  - 1 x daily - 7 x weekly - 2  sets - 10 reps ?- Standing Shoulder Row with Anchored Resistance  - 1 x daily - 7 x weekly - 2 sets - 10 reps ? ? ? ?ASSESSMENT: ? ?CLINICAL IMPRESSION: ?Pt had improved soft tissue extensibility following STM but did not have  significant change in R shoulder pain with AROM. Pt session was then focused on progressing OH ROM as well as progressive R shoulder loading as tolerated. HEP was updated at this time. Plan to continue with progressing ROM and strength as able until pt return to MD for discussion of further medical intervention. Patient should benefit from continued skilled PT to address goals and impairments and improve overall function. ? ?   ? ?Objective impairments include decreased ROM, decreased strength, hypomobility, increased edema, increased muscle spasms, impaired flexibility, impaired UE functional use, improper body mechanics, postural dysfunction, obesity, and pain. These impairments are limiting patient from cleaning, community activity, driving, occupation, Medical sales representative, yard work, shopping, and Naval architect . Personal factors including Age, Behavior pattern, Fitness, Profession, Time since onset of injury/illness/exacerbation, and 1-2 comorbidities:    are also affecting patient's functional outcome.  ? ?REHAB POTENTIAL: Good ? ?CLINICAL DECISION MAKING: Stable/uncomplicated ? ?EVALUATION COMPLEXITY: Low ? ? ?GOALS: ? ? ?SHORT TERM GOALS: ? ?STG Name Target Date Goal status  ?1 Pt will become independent with HEP in order to demonstrate synthesis of PT education. ? ? ?Baseline:  12/02/2021 GOAL MET  ?2 Pt will score at least 5 pt increase on FOTO to demonstrate functional improvement in MCII and pt perceived function.  ? ?Baseline:  10/10/2021 ? NOT MET  ?3 Pt will report at least 2 pt reduction on NPRS scale for pain in order to demonstrate functional improvement with household activity, self care, and ADL. ? ?Baseline: 10/03/2021 PARTIALLY MET  ?4 Pt will be able to demonstrate BHB reach and OH  reach without pain in order to demonstrate functional improvement in UEfunction for self-care and house hold duties. ? ?Baseline: 10/10/2021 NOT MET  ? ?LONG TERM GOALS:  ? ?LTG Name Target Date Goal status  ?1

## 2021-10-24 ENCOUNTER — Other Ambulatory Visit (HOSPITAL_BASED_OUTPATIENT_CLINIC_OR_DEPARTMENT_OTHER): Payer: Self-pay

## 2021-10-29 ENCOUNTER — Other Ambulatory Visit (HOSPITAL_BASED_OUTPATIENT_CLINIC_OR_DEPARTMENT_OTHER): Payer: Self-pay

## 2021-10-31 ENCOUNTER — Other Ambulatory Visit (HOSPITAL_BASED_OUTPATIENT_CLINIC_OR_DEPARTMENT_OTHER): Payer: Self-pay

## 2021-11-07 ENCOUNTER — Ambulatory Visit (HOSPITAL_BASED_OUTPATIENT_CLINIC_OR_DEPARTMENT_OTHER): Payer: No Typology Code available for payment source | Admitting: Physical Therapy

## 2021-11-11 ENCOUNTER — Encounter (HOSPITAL_BASED_OUTPATIENT_CLINIC_OR_DEPARTMENT_OTHER): Payer: Self-pay | Admitting: Physical Therapy

## 2021-11-11 ENCOUNTER — Encounter: Payer: Self-pay | Admitting: Family Medicine

## 2021-11-11 ENCOUNTER — Other Ambulatory Visit (HOSPITAL_BASED_OUTPATIENT_CLINIC_OR_DEPARTMENT_OTHER): Payer: Self-pay

## 2021-11-11 ENCOUNTER — Other Ambulatory Visit: Payer: Self-pay | Admitting: Family Medicine

## 2021-11-11 NOTE — Telephone Encounter (Signed)
OK to swtich back to Trulicity? Thanks! ?

## 2021-11-12 ENCOUNTER — Other Ambulatory Visit: Payer: Self-pay | Admitting: Family Medicine

## 2021-11-12 ENCOUNTER — Other Ambulatory Visit (HOSPITAL_BASED_OUTPATIENT_CLINIC_OR_DEPARTMENT_OTHER): Payer: Self-pay

## 2021-11-12 MED ORDER — TRULICITY 1.5 MG/0.5ML ~~LOC~~ SOAJ
1.5000 mg | SUBCUTANEOUS | 3 refills | Status: DC
  Filled 2021-11-12: qty 2, 28d supply, fill #0
  Filled 2021-12-07: qty 2, 28d supply, fill #1
  Filled 2022-01-17: qty 2, 28d supply, fill #2
  Filled 2022-03-04: qty 2, 28d supply, fill #3

## 2021-11-13 ENCOUNTER — Other Ambulatory Visit (HOSPITAL_BASED_OUTPATIENT_CLINIC_OR_DEPARTMENT_OTHER): Payer: Self-pay

## 2021-11-29 ENCOUNTER — Other Ambulatory Visit (HOSPITAL_BASED_OUTPATIENT_CLINIC_OR_DEPARTMENT_OTHER): Payer: Self-pay

## 2021-12-09 ENCOUNTER — Other Ambulatory Visit (HOSPITAL_BASED_OUTPATIENT_CLINIC_OR_DEPARTMENT_OTHER): Payer: Self-pay

## 2021-12-13 ENCOUNTER — Other Ambulatory Visit (HOSPITAL_BASED_OUTPATIENT_CLINIC_OR_DEPARTMENT_OTHER): Payer: Self-pay

## 2021-12-23 ENCOUNTER — Other Ambulatory Visit (HOSPITAL_BASED_OUTPATIENT_CLINIC_OR_DEPARTMENT_OTHER): Payer: Self-pay

## 2021-12-23 ENCOUNTER — Other Ambulatory Visit: Payer: Self-pay | Admitting: Family Medicine

## 2021-12-23 MED ORDER — SYNTHROID 125 MCG PO TABS
250.0000 ug | ORAL_TABLET | Freq: Every day | ORAL | 1 refills | Status: DC
  Filled 2021-12-23: qty 60, 30d supply, fill #0
  Filled 2022-02-06: qty 60, 30d supply, fill #1

## 2022-01-17 ENCOUNTER — Other Ambulatory Visit (HOSPITAL_BASED_OUTPATIENT_CLINIC_OR_DEPARTMENT_OTHER): Payer: Self-pay

## 2022-02-06 ENCOUNTER — Other Ambulatory Visit (HOSPITAL_BASED_OUTPATIENT_CLINIC_OR_DEPARTMENT_OTHER): Payer: Self-pay

## 2022-02-06 ENCOUNTER — Other Ambulatory Visit: Payer: Self-pay | Admitting: Family Medicine

## 2022-02-06 DIAGNOSIS — Z1231 Encounter for screening mammogram for malignant neoplasm of breast: Secondary | ICD-10-CM

## 2022-02-18 DIAGNOSIS — Z1231 Encounter for screening mammogram for malignant neoplasm of breast: Secondary | ICD-10-CM

## 2022-02-27 ENCOUNTER — Ambulatory Visit
Admission: RE | Admit: 2022-02-27 | Discharge: 2022-02-27 | Disposition: A | Discharge status: Home / Self Care | Payer: No Typology Code available for payment source | Source: Ambulatory Visit

## 2022-02-27 DIAGNOSIS — Z1231 Encounter for screening mammogram for malignant neoplasm of breast: Secondary | ICD-10-CM

## 2022-03-04 ENCOUNTER — Other Ambulatory Visit: Payer: Self-pay | Admitting: Family Medicine

## 2022-03-04 ENCOUNTER — Other Ambulatory Visit (HOSPITAL_BASED_OUTPATIENT_CLINIC_OR_DEPARTMENT_OTHER): Payer: Self-pay

## 2022-03-04 MED ORDER — SYNTHROID 125 MCG PO TABS
250.0000 ug | ORAL_TABLET | Freq: Every day | ORAL | 0 refills | Status: DC
  Filled 2022-03-04: qty 60, 30d supply, fill #0

## 2022-03-04 NOTE — Telephone Encounter (Signed)
Pls call pt for a CPE w/pcp for med eval and refills

## 2022-03-08 ENCOUNTER — Telehealth: Payer: No Typology Code available for payment source | Admitting: Nurse Practitioner

## 2022-03-08 DIAGNOSIS — M545 Low back pain, unspecified: Secondary | ICD-10-CM | POA: Diagnosis not present

## 2022-03-08 MED ORDER — PREDNISONE 20 MG PO TABS: 40.0000 mg | ORAL_TABLET | Freq: Every day | ORAL | 0 refills | Status: AC

## 2022-03-08 MED ORDER — CYCLOBENZAPRINE HCL 10 MG PO TABS: 10.0000 mg | ORAL_TABLET | Freq: Three times a day (TID) | ORAL | 1 refills | Status: DC | PRN

## 2022-03-08 NOTE — Progress Notes (Signed)

## 2022-03-17 ENCOUNTER — Encounter: Payer: Self-pay | Admitting: Family Medicine

## 2022-06-12 ENCOUNTER — Other Ambulatory Visit: Payer: Self-pay | Admitting: Family Medicine

## 2022-06-12 ENCOUNTER — Other Ambulatory Visit (HOSPITAL_BASED_OUTPATIENT_CLINIC_OR_DEPARTMENT_OTHER): Payer: Self-pay

## 2022-06-12 MED ORDER — TRULICITY 1.5 MG/0.5ML ~~LOC~~ SOAJ
1.5000 mg | SUBCUTANEOUS | 3 refills | Status: DC
Start: 2022-06-12 — End: 2022-08-29
  Filled 2022-06-12: qty 2, 28d supply, fill #0
  Filled 2022-08-28: qty 2, 28d supply, fill #1

## 2022-06-13 ENCOUNTER — Other Ambulatory Visit (HOSPITAL_BASED_OUTPATIENT_CLINIC_OR_DEPARTMENT_OTHER): Payer: Self-pay

## 2022-06-13 ENCOUNTER — Encounter (HOSPITAL_BASED_OUTPATIENT_CLINIC_OR_DEPARTMENT_OTHER): Payer: Self-pay | Admitting: Pharmacist

## 2022-08-21 ENCOUNTER — Other Ambulatory Visit: Payer: 59

## 2022-08-21 DIAGNOSIS — E782 Mixed hyperlipidemia: Secondary | ICD-10-CM

## 2022-08-21 DIAGNOSIS — E039 Hypothyroidism, unspecified: Secondary | ICD-10-CM

## 2022-08-21 DIAGNOSIS — E119 Type 2 diabetes mellitus without complications: Secondary | ICD-10-CM

## 2022-08-22 LAB — CBC WITH DIFFERENTIAL/PLATELET
Absolute Monocytes: 537 cells/uL (ref 200–950)
Basophils Absolute: 63 cells/uL (ref 0–200)
Basophils Relative: 0.8 %
Eosinophils Absolute: 269 cells/uL (ref 15–500)
Eosinophils Relative: 3.4 %
HCT: 38.7 % (ref 35.0–45.0)
Hemoglobin: 13.1 g/dL (ref 11.7–15.5)
Lymphs Abs: 1509 cells/uL (ref 850–3900)
MCH: 28.4 pg (ref 27.0–33.0)
MCHC: 33.9 g/dL (ref 32.0–36.0)
MCV: 83.9 fL (ref 80.0–100.0)
MPV: 11.7 fL (ref 7.5–12.5)
Monocytes Relative: 6.8 %
Neutro Abs: 5522 cells/uL (ref 1500–7800)
Neutrophils Relative %: 69.9 %
Platelets: 259 10*3/uL (ref 140–400)
RBC: 4.61 10*6/uL (ref 3.80–5.10)
RDW: 13.7 % (ref 11.0–15.0)
Total Lymphocyte: 19.1 %
WBC: 7.9 10*3/uL (ref 3.8–10.8)

## 2022-08-22 LAB — LIPID PANEL
Cholesterol: 154 mg/dL (ref <200)
HDL: 47 mg/dL — ABNORMAL LOW (ref >=50)
LDL Cholesterol (Calc): 85 mg/dL (calc)
Non-HDL Cholesterol (Calc): 107 mg/dL (calc) (ref <130)
Total CHOL/HDL Ratio: 3.3 (calc) (ref <5.0)
Triglycerides: 120 mg/dL (ref <150)

## 2022-08-22 LAB — COMPLETE METABOLIC PANEL WITH GFR
AG Ratio: 1.1 (calc) (ref 1.0–2.5)
ALT: 17 U/L (ref 6–29)
AST: 15 U/L (ref 10–30)
Albumin: 3.8 g/dL (ref 3.6–5.1)
Alkaline phosphatase (APISO): 47 U/L (ref 31–125)
BUN: 17 mg/dL (ref 7–25)
CO2: 22 mmol/L (ref 20–32)
Calcium: 9.3 mg/dL (ref 8.6–10.2)
Chloride: 106 mmol/L (ref 98–110)
Creat: 0.89 mg/dL (ref 0.50–0.99)
Globulin: 3.4 g/dL (calc) (ref 1.9–3.7)
Glucose, Bld: 94 mg/dL (ref 65–99)
Potassium: 4.1 mmol/L (ref 3.5–5.3)
Sodium: 139 mmol/L (ref 135–146)
Total Bilirubin: 0.5 mg/dL (ref 0.2–1.2)
Total Protein: 7.2 g/dL (ref 6.1–8.1)
eGFR: 82 mL/min/{1.73_m2} (ref >=60)

## 2022-08-22 LAB — TSH: TSH: 0.2 mIU/L — ABNORMAL LOW

## 2022-08-28 ENCOUNTER — Other Ambulatory Visit: Payer: Self-pay | Admitting: Family Medicine

## 2022-08-28 ENCOUNTER — Other Ambulatory Visit (HOSPITAL_BASED_OUTPATIENT_CLINIC_OR_DEPARTMENT_OTHER): Payer: Self-pay

## 2022-08-28 ENCOUNTER — Encounter: Payer: Self-pay | Admitting: Family Medicine

## 2022-08-28 ENCOUNTER — Other Ambulatory Visit: Payer: Self-pay

## 2022-08-28 DIAGNOSIS — E039 Hypothyroidism, unspecified: Secondary | ICD-10-CM

## 2022-08-28 MED ORDER — SYNTHROID 125 MCG PO TABS
250.0000 ug | ORAL_TABLET | Freq: Every day | ORAL | 3 refills | Status: DC
  Filled 2022-08-28: qty 60, 30d supply, fill #0

## 2022-08-29 ENCOUNTER — Other Ambulatory Visit: Payer: Self-pay | Admitting: Family Medicine

## 2022-08-29 ENCOUNTER — Other Ambulatory Visit (HOSPITAL_BASED_OUTPATIENT_CLINIC_OR_DEPARTMENT_OTHER): Payer: Self-pay

## 2022-08-29 MED ORDER — TRULICITY 3 MG/0.5ML ~~LOC~~ SOAJ
3.0000 mg | SUBCUTANEOUS | 5 refills | Status: DC
  Filled 2022-08-29: qty 2, 28d supply, fill #0
  Filled 2022-09-29 (×2): qty 2, 28d supply, fill #1

## 2022-08-29 MED ORDER — LEVOTHYROXINE SODIUM 125 MCG PO TABS
250.0000 ug | ORAL_TABLET | Freq: Every day | ORAL | 3 refills | Status: DC
  Filled 2022-08-29: qty 180, 90d supply, fill #0
  Filled 2022-08-29: qty 90, 90d supply, fill #0

## 2022-09-29 ENCOUNTER — Other Ambulatory Visit (HOSPITAL_BASED_OUTPATIENT_CLINIC_OR_DEPARTMENT_OTHER): Payer: Self-pay

## 2022-10-09 ENCOUNTER — Ambulatory Visit (INDEPENDENT_AMBULATORY_CARE_PROVIDER_SITE_OTHER): Payer: 59 | Admitting: Family Medicine

## 2022-10-09 ENCOUNTER — Encounter: Payer: Self-pay | Admitting: Family Medicine

## 2022-10-09 ENCOUNTER — Other Ambulatory Visit (HOSPITAL_BASED_OUTPATIENT_CLINIC_OR_DEPARTMENT_OTHER): Payer: Self-pay

## 2022-10-09 VITALS — BP 132/80 | HR 99 | Temp 99.2°F | Ht 68.0 in

## 2022-10-09 DIAGNOSIS — Z0000 Encounter for general adult medical examination without abnormal findings: Secondary | ICD-10-CM

## 2022-10-09 DIAGNOSIS — Z0001 Encounter for general adult medical examination with abnormal findings: Secondary | ICD-10-CM

## 2022-10-09 DIAGNOSIS — E119 Type 2 diabetes mellitus without complications: Secondary | ICD-10-CM | POA: Diagnosis not present

## 2022-10-09 DIAGNOSIS — E039 Hypothyroidism, unspecified: Secondary | ICD-10-CM

## 2022-10-09 DIAGNOSIS — E782 Mixed hyperlipidemia: Secondary | ICD-10-CM | POA: Diagnosis not present

## 2022-10-09 DIAGNOSIS — Z794 Long term (current) use of insulin: Secondary | ICD-10-CM

## 2022-10-09 MED ORDER — TRIAMCINOLONE ACETONIDE 0.1 % EX CREA
1.0000 | TOPICAL_CREAM | Freq: Two times a day (BID) | CUTANEOUS | 0 refills | Status: DC
  Filled 2022-10-09: qty 30, 15d supply, fill #0

## 2022-10-09 NOTE — Progress Notes (Signed)
Subjective:    Patient ID: Grace Hawkins, female    DOB: 05/17/1976, 47 y.o.   MRN: [id number]  HPI Patient is a very sweet 47 year old Caucasian female who is here today for physical exam.  Since I last saw the patient, her husband passed away.  He had a history of cholangiocarcinoma.  However she seems to be doing remarkably well.  She is still working as a Engineer, civil (consulting) in the emergency room.  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  She receives her mammogram and her Pap smear through her gynecologist.  These are up-to-date.  She is due for colonoscopy in November.  Her most recent lab work was done in February.  At that time her TSH was mildly suppressed at 0.20.  She is taking 250 mg of Synthroid daily.  She is due to recheck her TSH today along with her A1c.  She is overdue for urine protein creatinine ratio.  Clinic was performed today and is normal.  She does have some mild contact dermatitis on the dorsum of both feet due to a reaction to her laundry detergent.  There is no evidence of cellulitis. Past Medical History:  Diagnosis Date   Allergy    Elevated liver function tests 11/23/2013   Hypothyroid    Pyelonephritis 11/23/2013   No past surgical history on file. Current Outpatient Medications on File Prior to Visit  Medication Sig Dispense Refill   cetirizine (ZYRTEC) 10 MG tablet Take 1 tablet (10 mg total) by mouth daily as needed for allergies. 30 tablet 11   cyclobenzaprine (FLEXERIL) 10 MG tablet Take 1 tablet (10 mg total) by mouth 3 (three) times daily as needed for muscle spasms. 30 tablet 1   Dulaglutide (TRULICITY) 3 MG/0.5ML SOPN Inject 3 mg as directed once a week. 2 mL 5   levothyroxine (SYNTHROID) 125 MCG tablet Take 2 tablets (250 mcg total) by mouth daily. 180 tablet 3   Blood Glucose Monitoring Suppl KIT Use to check blood sugar up to 3 times daily ICD 10: R73.09, Dispense based on insurance preference (Patient not taking: Reported on 07/11/2021) 1 each 0   No current  facility-administered medications on file prior to visit.   Marland Kitchenall Social History   Socioeconomic History   Marital status: Married    Spouse name: Not on file   Number of children: Not on file   Years of education: Not on file   Highest education level: Not on file  Occupational History   Not on file  Tobacco Use   Smoking status: Never   Smokeless tobacco: Never  Substance and Sexual Activity   Alcohol use: No   Drug use: No   Sexual activity: Not on file  Other Topics Concern   Not on file  Social History Narrative   Not on file   Social Determinants of Health   Financial Resource Strain: Not on file  Food Insecurity: Not on file  Transportation Needs: Not on file  Physical Activity: Not on file  Stress: Not on file  Social Connections: Not on file  Intimate Partner Violence: Not on file      Review of Systems     Objective:   Physical Exam Vitals reviewed.  Constitutional:      General: She is not in acute distress.    Appearance: Normal appearance. She is obese. She is not ill-appearing or toxic-appearing.  HENT:     Head: Normocephalic and atraumatic.     Right Ear: Tympanic membrane and  ear canal normal.     Left Ear: Tympanic membrane and ear canal normal.     Nose: Nose normal. No congestion or rhinorrhea.     Mouth/Throat:     Mouth: Mucous membranes are moist.     Pharynx: Oropharynx is clear. No oropharyngeal exudate or posterior oropharyngeal erythema.  Eyes:     General: No scleral icterus.       Right eye: No discharge.        Left eye: No discharge.     Extraocular Movements: Extraocular movements intact.     Conjunctiva/sclera: Conjunctivae normal.     Pupils: Pupils are equal, round, and reactive to light.  Neck:     Vascular: No carotid bruit.  Cardiovascular:     Rate and Rhythm: Normal rate and regular rhythm.     Pulses: Normal pulses.     Heart sounds: Normal heart sounds. No murmur heard.    No friction rub. No gallop.   Pulmonary:     Effort: Pulmonary effort is normal. No respiratory distress.     Breath sounds: Normal breath sounds. No stridor. No wheezing, rhonchi or rales.  Chest:     Chest wall: No tenderness.  Abdominal:     General: Abdomen is flat. Bowel sounds are normal. There is no distension.     Palpations: Abdomen is soft.     Tenderness: There is no abdominal tenderness. There is no guarding or rebound.     Hernia: No hernia is present.  Musculoskeletal:     Cervical back: No rigidity.     Right lower leg: No edema.     Left lower leg: No edema.  Lymphadenopathy:     Cervical: No cervical adenopathy.  Skin:    Findings: No erythema or rash.  Neurological:     General: No focal deficit present.     Mental Status: She is alert and oriented to person, place, and time. Mental status is at baseline.     Cranial Nerves: No cranial nerve deficit.     Sensory: No sensory deficit.     Motor: No weakness.     Coordination: Coordination normal.     Gait: Gait normal.  Psychiatric:        Mood and Affect: Mood normal.        Behavior: Behavior normal.        Thought Content: Thought content normal.        Judgment: Judgment normal.           Assessment & Plan:   General medical exam  Hypothyroidism, unspecified type - Plan: TSH  Hyperlipidemia, mixed - Plan: HM Diabetes Foot Exam  Type 2 diabetes mellitus without complication, with long-term current use of insulin - Plan: Hemoglobin A1c, Protein / Creatinine Ratio, Urine, HM Diabetes Foot Exam, CANCELED: CBC with Differential/Platelet, CANCELED: COMPLETE METABOLIC PANEL WITH GFR, CANCELED: Lipid panel I would like to switch Trulicity to [facility] to try to improve her chances for weight loss.  Discontinue Trulicity and begin Mounjaro 10 mg subcu weekly and uptitrate monthly as tolerated to the maximum tolerated dose.  Mammogram and Pap smear are up-to-date.  Recommended a colonoscopy this fall.  Check A1c.  Goal A1c is less than  6.5.  Check TSH to ensure that we have adequate dosage of levothyroxine.  Check a urine protein to creatinine ratio to evaluate for any evidence of diabetic nephropathy.  Diabetic foot exam was performed today and is normal

## 2022-10-10 ENCOUNTER — Other Ambulatory Visit: Payer: Self-pay

## 2022-10-10 ENCOUNTER — Other Ambulatory Visit (HOSPITAL_BASED_OUTPATIENT_CLINIC_OR_DEPARTMENT_OTHER): Payer: Self-pay

## 2022-10-10 ENCOUNTER — Other Ambulatory Visit: Payer: Self-pay | Admitting: Family Medicine

## 2022-10-10 ENCOUNTER — Encounter: Payer: Self-pay | Admitting: Family Medicine

## 2022-10-10 DIAGNOSIS — E039 Hypothyroidism, unspecified: Secondary | ICD-10-CM

## 2022-10-10 LAB — PROTEIN / CREATININE RATIO, URINE
Creatinine, Urine: 172 mg/dL (ref 20–275)
Protein/Creat Ratio: 81 mg/g creat (ref 24–184)
Protein/Creatinine Ratio: 0.081 mg/mg creat (ref 0.024–0.184)
Total Protein, Urine: 14 mg/dL (ref 5–24)

## 2022-10-10 LAB — HEMOGLOBIN A1C
Hgb A1c MFr Bld: 5.3 % of total Hgb (ref <5.7)
Mean Plasma Glucose: 105 mg/dL
eAG (mmol/L): 5.8 mmol/L

## 2022-10-10 LAB — TSH: TSH: 0.17 mIU/L — ABNORMAL LOW

## 2022-10-10 MED ORDER — TIRZEPATIDE 10 MG/0.5ML ~~LOC~~ SOAJ
10.0000 mg | SUBCUTANEOUS | 2 refills | Status: DC
  Filled 2022-10-10: qty 2, 28d supply, fill #0

## 2022-10-10 MED ORDER — LEVOTHYROXINE SODIUM 200 MCG PO TABS
200.0000 ug | ORAL_TABLET | Freq: Every day | ORAL | 1 refills | Status: DC
  Filled 2022-10-10: qty 90, 90d supply, fill #0

## 2022-10-10 MED ORDER — LEVOTHYROXINE SODIUM 25 MCG PO TABS
25.0000 ug | ORAL_TABLET | Freq: Every day | ORAL | 1 refills | Status: DC
Start: 2022-10-10 — End: 2022-10-13
  Filled 2022-10-10: qty 90, 90d supply, fill #0

## 2022-10-13 ENCOUNTER — Other Ambulatory Visit: Payer: Self-pay | Admitting: Family Medicine

## 2022-10-13 ENCOUNTER — Other Ambulatory Visit (HOSPITAL_BASED_OUTPATIENT_CLINIC_OR_DEPARTMENT_OTHER): Payer: Self-pay

## 2022-10-13 MED ORDER — LEVOTHYROXINE SODIUM 112 MCG PO TABS
224.0000 ug | ORAL_TABLET | Freq: Every day | ORAL | 3 refills | Status: DC
  Filled 2022-10-13: qty 180, 90d supply, fill #0

## 2022-11-03 ENCOUNTER — Other Ambulatory Visit (HOSPITAL_BASED_OUTPATIENT_CLINIC_OR_DEPARTMENT_OTHER): Payer: Self-pay

## 2022-11-03 ENCOUNTER — Other Ambulatory Visit: Payer: Self-pay | Admitting: Family Medicine

## 2022-11-03 MED ORDER — TIRZEPATIDE 12.5 MG/0.5ML ~~LOC~~ SOAJ
12.5000 mg | SUBCUTANEOUS | 3 refills | Status: DC
  Filled 2022-11-03: qty 2, 28d supply, fill #0

## 2022-11-30 ENCOUNTER — Encounter: Payer: Self-pay | Admitting: Family Medicine

## 2022-12-01 ENCOUNTER — Other Ambulatory Visit: Payer: Self-pay

## 2022-12-01 ENCOUNTER — Other Ambulatory Visit (HOSPITAL_BASED_OUTPATIENT_CLINIC_OR_DEPARTMENT_OTHER): Payer: Self-pay

## 2022-12-01 DIAGNOSIS — Z794 Long term (current) use of insulin: Secondary | ICD-10-CM

## 2022-12-01 DIAGNOSIS — E782 Mixed hyperlipidemia: Secondary | ICD-10-CM

## 2022-12-01 MED ORDER — TIRZEPATIDE 15 MG/0.5ML ~~LOC~~ SOAJ
15.0000 mg | SUBCUTANEOUS | 1 refills | Status: DC
Start: 2022-12-01 — End: 2023-06-22
  Filled 2022-12-01 – 2022-12-31 (×3): qty 2, 28d supply, fill #0
  Filled 2023-01-28: qty 2, 28d supply, fill #1
  Filled 2023-02-23: qty 2, 28d supply, fill #2
  Filled 2023-03-23 – 2023-03-24 (×2): qty 2, 28d supply, fill #3
  Filled 2023-04-20: qty 2, 28d supply, fill #4
  Filled 2023-05-17: qty 2, 28d supply, fill #5

## 2022-12-04 ENCOUNTER — Other Ambulatory Visit (HOSPITAL_BASED_OUTPATIENT_CLINIC_OR_DEPARTMENT_OTHER): Payer: Self-pay

## 2022-12-04 ENCOUNTER — Other Ambulatory Visit: Payer: Self-pay

## 2022-12-04 ENCOUNTER — Other Ambulatory Visit: Payer: Self-pay | Admitting: Family Medicine

## 2022-12-04 DIAGNOSIS — E782 Mixed hyperlipidemia: Secondary | ICD-10-CM

## 2022-12-04 DIAGNOSIS — E119 Type 2 diabetes mellitus without complications: Secondary | ICD-10-CM

## 2022-12-04 MED ORDER — TIRZEPATIDE 12.5 MG/0.5ML ~~LOC~~ SOAJ
12.5000 mg | SUBCUTANEOUS | 3 refills | Status: DC
  Filled 2022-12-04: qty 2, 28d supply, fill #0
  Filled 2022-12-30: qty 2, 28d supply, fill #1

## 2022-12-05 ENCOUNTER — Other Ambulatory Visit (HOSPITAL_BASED_OUTPATIENT_CLINIC_OR_DEPARTMENT_OTHER): Payer: Self-pay

## 2022-12-11 ENCOUNTER — Other Ambulatory Visit: Payer: 59

## 2022-12-22 ENCOUNTER — Other Ambulatory Visit (HOSPITAL_BASED_OUTPATIENT_CLINIC_OR_DEPARTMENT_OTHER): Payer: Self-pay

## 2022-12-30 ENCOUNTER — Other Ambulatory Visit (HOSPITAL_BASED_OUTPATIENT_CLINIC_OR_DEPARTMENT_OTHER): Payer: Self-pay

## 2022-12-31 ENCOUNTER — Other Ambulatory Visit (HOSPITAL_BASED_OUTPATIENT_CLINIC_OR_DEPARTMENT_OTHER): Payer: Self-pay

## 2023-01-05 ENCOUNTER — Other Ambulatory Visit: Payer: 59

## 2023-01-05 DIAGNOSIS — E039 Hypothyroidism, unspecified: Secondary | ICD-10-CM

## 2023-01-06 LAB — TSH: TSH: 0.01 mIU/L — ABNORMAL LOW

## 2023-01-08 ENCOUNTER — Other Ambulatory Visit (HOSPITAL_BASED_OUTPATIENT_CLINIC_OR_DEPARTMENT_OTHER): Payer: Self-pay

## 2023-01-08 ENCOUNTER — Other Ambulatory Visit: Payer: Self-pay

## 2023-01-08 DIAGNOSIS — E039 Hypothyroidism, unspecified: Secondary | ICD-10-CM

## 2023-01-08 MED ORDER — LEVOTHYROXINE SODIUM 200 MCG PO TABS
200.0000 ug | ORAL_TABLET | Freq: Every day | ORAL | 1 refills | Status: DC
Start: 2023-01-08 — End: 2023-03-13
  Filled 2023-01-08: qty 30, 30d supply, fill #0
  Filled 2023-02-23: qty 30, 30d supply, fill #1

## 2023-01-21 DIAGNOSIS — M79641 Pain in right hand: Secondary | ICD-10-CM | POA: Insufficient documentation

## 2023-01-21 DIAGNOSIS — S60221A Contusion of right hand, initial encounter: Secondary | ICD-10-CM | POA: Diagnosis not present

## 2023-01-22 DIAGNOSIS — S60221A Contusion of right hand, initial encounter: Secondary | ICD-10-CM | POA: Insufficient documentation

## 2023-01-30 ENCOUNTER — Other Ambulatory Visit (HOSPITAL_BASED_OUTPATIENT_CLINIC_OR_DEPARTMENT_OTHER): Payer: Self-pay

## 2023-02-26 ENCOUNTER — Other Ambulatory Visit (HOSPITAL_BASED_OUTPATIENT_CLINIC_OR_DEPARTMENT_OTHER): Payer: Self-pay

## 2023-03-06 ENCOUNTER — Other Ambulatory Visit: Payer: 59

## 2023-03-06 DIAGNOSIS — E039 Hypothyroidism, unspecified: Secondary | ICD-10-CM | POA: Diagnosis not present

## 2023-03-07 LAB — TSH: TSH: 0.02 m[IU]/L — ABNORMAL LOW

## 2023-03-13 ENCOUNTER — Other Ambulatory Visit (HOSPITAL_BASED_OUTPATIENT_CLINIC_OR_DEPARTMENT_OTHER): Payer: Self-pay

## 2023-03-13 ENCOUNTER — Other Ambulatory Visit: Payer: Self-pay

## 2023-03-13 DIAGNOSIS — E039 Hypothyroidism, unspecified: Secondary | ICD-10-CM

## 2023-03-13 MED ORDER — LEVOTHYROXINE SODIUM 175 MCG PO TABS
175.0000 ug | ORAL_TABLET | Freq: Every day | ORAL | 3 refills | Status: DC
  Filled 2023-03-13: qty 90, 90d supply, fill #0
  Filled 2023-08-01: qty 90, 90d supply, fill #1

## 2023-03-24 ENCOUNTER — Other Ambulatory Visit (HOSPITAL_BASED_OUTPATIENT_CLINIC_OR_DEPARTMENT_OTHER): Payer: Self-pay

## 2023-03-30 ENCOUNTER — Other Ambulatory Visit (HOSPITAL_COMMUNITY): Payer: Self-pay

## 2023-04-20 ENCOUNTER — Other Ambulatory Visit (HOSPITAL_BASED_OUTPATIENT_CLINIC_OR_DEPARTMENT_OTHER): Payer: Self-pay

## 2023-06-18 ENCOUNTER — Other Ambulatory Visit: Payer: Self-pay | Admitting: Family Medicine

## 2023-06-18 DIAGNOSIS — E782 Mixed hyperlipidemia: Secondary | ICD-10-CM

## 2023-06-18 DIAGNOSIS — E119 Type 2 diabetes mellitus without complications: Secondary | ICD-10-CM

## 2023-06-18 NOTE — Telephone Encounter (Signed)
Requested medications are due for refill today.  yes  Requested medications are on the active medications list.  yes  Last refill. 12/01/2022 6mL 1 rf  Future visit scheduled.   yes  Notes to clinic.  Medication not assigned to a protocol. Please review for refill.    Requested Prescriptions  Pending Prescriptions Disp Refills   tirzepatide (MOUNJARO) 15 MG/0.5ML Pen 6 mL 1    Sig: Inject 15 mg into the skin once a week.     Off-Protocol Failed - 06/18/2023  1:20 PM      Failed - Medication not assigned to a protocol, review manually.      Failed - Valid encounter within last 12 months    Recent Outpatient Visits           2 years ago Type 2 diabetes mellitus without complication, with long-term current use of insulin (HCC)   [facility] [place], Priscille Heidelberg, MD   2 years ago Type 2 diabetes mellitus without complication, with long-term current use of insulin (HCC)   [facility] [place], Priscille Heidelberg, MD   2 years ago Hypothyroidism, unspecified type   [facility] [place], Velna Hatchet, MD   3 years ago Hypothyroidism, unspecified type   [facility] [place], Velna Hatchet, MD   3 years ago Routine general medical examination at a health care facility   [facility] [place], Velna Hatchet, MD       Future Appointments             In 3 months Pickard, Priscille Heidelberg, MD [facility], [facility]

## 2023-06-22 ENCOUNTER — Telehealth: Payer: Self-pay

## 2023-06-22 ENCOUNTER — Other Ambulatory Visit (HOSPITAL_BASED_OUTPATIENT_CLINIC_OR_DEPARTMENT_OTHER): Payer: Self-pay

## 2023-06-22 ENCOUNTER — Other Ambulatory Visit: Payer: Self-pay | Admitting: Family Medicine

## 2023-06-22 ENCOUNTER — Other Ambulatory Visit: Payer: Self-pay

## 2023-06-22 DIAGNOSIS — Z794 Long term (current) use of insulin: Secondary | ICD-10-CM

## 2023-06-22 DIAGNOSIS — E782 Mixed hyperlipidemia: Secondary | ICD-10-CM

## 2023-06-22 MED ORDER — TIRZEPATIDE 15 MG/0.5ML ~~LOC~~ SOAJ
15.0000 mg | SUBCUTANEOUS | 1 refills | Status: DC
Start: 2023-06-22 — End: 2023-12-03
  Filled 2023-06-22: qty 6, 84d supply, fill #0
  Filled 2023-09-21: qty 6, 84d supply, fill #1

## 2023-06-22 NOTE — Telephone Encounter (Signed)
Requested medication (s) are due for refill today - yes  Requested medication (s) are on the active medication list --yes  Future visit scheduled -yes  Last refill: 12/01/22 6ml 1RF  Notes to clinic: off protocol- provider review   Requested Prescriptions  Pending Prescriptions Disp Refills   tirzepatide (MOUNJARO) 15 MG/0.5ML Pen 6 mL 1    Sig: Inject 15 mg into the skin once a week.     Off-Protocol Failed - 06/22/2023  9:50 AM      Failed - Medication not assigned to a protocol, review manually.      Failed - Valid encounter within last 12 months    Recent Outpatient Visits           2 years ago Type 2 diabetes mellitus without complication, with long-term current use of insulin (HCC)   [facility] Medicine [place], Priscille Heidelberg, MD   2 years ago Type 2 diabetes mellitus without complication, with long-term current use of insulin (HCC)   [facility] Medicine [place], Priscille Heidelberg, MD   2 years ago Hypothyroidism, unspecified type   [facility] Medicine [place], Velna Hatchet, MD   3 years ago Hypothyroidism, unspecified type   [facility] Medicine [place], Velna Hatchet, MD   3 years ago Routine general medical examination at a health care facility   [facility] Medicine [place], Velna Hatchet, MD       Future Appointments             In 3 months Pickard, Priscille Heidelberg, MD [facility] Family [facility], [facility]               Requested Prescriptions  Pending Prescriptions Disp Refills   tirzepatide [facility]) 15 MG/0.5ML Pen 6 mL 1    Sig: Inject 15 mg into the skin once a week.     Off-Protocol Failed - 06/22/2023  9:50 AM      Failed - Medication not assigned to a protocol, review manually.      Failed - Valid encounter within last 12 months    Recent Outpatient Visits           2 years ago Type 2 diabetes mellitus without complication, with long-term current use of insulin (HCC)   [facility] Medicine [place], Priscille Heidelberg, MD    2 years ago Type 2 diabetes mellitus without complication, with long-term current use of insulin (HCC)   [facility] Medicine [place], Priscille Heidelberg, MD   2 years ago Hypothyroidism, unspecified type   [facility] [place], Velna Hatchet, MD   3 years ago Hypothyroidism, unspecified type   [facility] Medicine [place], Velna Hatchet, MD   3 years ago Routine general medical examination at a health care facility   [facility] [place], Velna Hatchet, MD       Future Appointments             In 3 months Pickard, Priscille Heidelberg, MD [facility], [facility]

## 2023-06-22 NOTE — Telephone Encounter (Signed)
Copied from CRM [id number]. Topic: Clinical - Medication Refill >> Jun 22, 2023  2:59 PM Fuller Mandril wrote: Most Recent Primary Care Visit:  Provider: [facility]  Department: [facility]  Visit Type: LAB  Date: 03/06/2023  Medication: tirzepatide [facility]) 15 MG/0.5ML Pen  Has the patient contacted their pharmacy? Yes - pharmacy has sent 2 requests and has not received response.  (Agent: If no, request that the patient contact the pharmacy for the refill. If patient does not wish to contact the pharmacy document the reason why and proceed with request.) (Agent: If yes, when and what did the pharmacy advise?)  Is this the correct pharmacy for this prescription? Yes If no, delete pharmacy and type the correct one.  This is the patient's preferred pharmacy:  [facility] [address] [place] [address] Phone: [phone] Fax: [phone]   Has the prescription been filled recently? 12/01/2022  Is the patient out of the medication? Yes  Has the patient been seen for an appointment in the last year OR does the patient have an upcoming appointment? Yes  Can we respond through MyChart? No  Agent: Please be advised that Rx refills may take up to 3 business days. We ask that you follow-up with your pharmacy.

## 2023-07-31 ENCOUNTER — Other Ambulatory Visit: Payer: Commercial Managed Care - PPO

## 2023-07-31 DIAGNOSIS — Z794 Long term (current) use of insulin: Secondary | ICD-10-CM | POA: Diagnosis not present

## 2023-07-31 DIAGNOSIS — E66813 Obesity, class 3: Secondary | ICD-10-CM | POA: Diagnosis not present

## 2023-07-31 DIAGNOSIS — E039 Hypothyroidism, unspecified: Secondary | ICD-10-CM | POA: Diagnosis not present

## 2023-07-31 DIAGNOSIS — E782 Mixed hyperlipidemia: Secondary | ICD-10-CM

## 2023-07-31 DIAGNOSIS — E119 Type 2 diabetes mellitus without complications: Secondary | ICD-10-CM | POA: Diagnosis not present

## 2023-08-01 ENCOUNTER — Other Ambulatory Visit: Payer: Self-pay | Admitting: Family Medicine

## 2023-08-01 DIAGNOSIS — Z1231 Encounter for screening mammogram for malignant neoplasm of breast: Secondary | ICD-10-CM

## 2023-08-01 LAB — COMPLETE METABOLIC PANEL WITH GFR
AG Ratio: 1.8 (calc) (ref 1.0–2.5)
ALT: 12 U/L (ref 6–29)
AST: 14 U/L (ref 10–35)
Albumin: 4.2 g/dL (ref 3.6–5.1)
Alkaline phosphatase (APISO): 40 U/L (ref 31–125)
BUN: 10 mg/dL (ref 7–25)
CO2: 25 mmol/L (ref 20–32)
Calcium: 9.2 mg/dL (ref 8.6–10.2)
Chloride: 106 mmol/L (ref 98–110)
Creat: 0.78 mg/dL (ref 0.50–0.99)
Globulin: 2.4 g/dL (ref 1.9–3.7)
Glucose, Bld: 73 mg/dL (ref 65–99)
Potassium: 3.9 mmol/L (ref 3.5–5.3)
Sodium: 141 mmol/L (ref 135–146)
Total Bilirubin: 0.6 mg/dL (ref 0.2–1.2)
Total Protein: 6.6 g/dL (ref 6.1–8.1)
eGFR: 95 mL/min/{1.73_m2} (ref >=60)

## 2023-08-01 LAB — HEMOGLOBIN A1C
Hgb A1c MFr Bld: 4.9 %{Hb} (ref <5.7)
Mean Plasma Glucose: 94 mg/dL
eAG (mmol/L): 5.2 mmol/L

## 2023-08-01 LAB — CBC WITH DIFFERENTIAL/PLATELET
Absolute Lymphocytes: 1922 {cells}/uL (ref 850–3900)
Absolute Monocytes: 439 {cells}/uL (ref 200–950)
Basophils Absolute: 58 {cells}/uL (ref 0–200)
Basophils Relative: 0.8 %
Eosinophils Absolute: 310 {cells}/uL (ref 15–500)
Eosinophils Relative: 4.3 %
HCT: 39.9 % (ref 35.0–45.0)
Hemoglobin: 13 g/dL (ref 11.7–15.5)
MCH: 29.1 pg (ref 27.0–33.0)
MCHC: 32.6 g/dL (ref 32.0–36.0)
MCV: 89.5 fL (ref 80.0–100.0)
MPV: 11.6 fL (ref 7.5–12.5)
Monocytes Relative: 6.1 %
Neutro Abs: 4471 {cells}/uL (ref 1500–7800)
Neutrophils Relative %: 62.1 %
Platelets: 254 10*3/uL (ref 140–400)
RBC: 4.46 10*6/uL (ref 3.80–5.10)
RDW: 12.8 % (ref 11.0–15.0)
Total Lymphocyte: 26.7 %
WBC: 7.2 10*3/uL (ref 3.8–10.8)

## 2023-08-01 LAB — LIPID PANEL
Cholesterol: 142 mg/dL (ref <200)
HDL: 52 mg/dL (ref >=50)
LDL Cholesterol (Calc): 73 mg/dL
Non-HDL Cholesterol (Calc): 90 mg/dL (ref <130)
Total CHOL/HDL Ratio: 2.7 (calc) (ref <5.0)
Triglycerides: 85 mg/dL (ref <150)

## 2023-08-01 LAB — TSH: TSH: 0.39 m[IU]/L — ABNORMAL LOW

## 2023-08-02 ENCOUNTER — Other Ambulatory Visit (HOSPITAL_BASED_OUTPATIENT_CLINIC_OR_DEPARTMENT_OTHER): Payer: Self-pay

## 2023-08-05 ENCOUNTER — Ambulatory Visit
Admission: RE | Admit: 2023-08-05 | Discharge: 2023-08-05 | Discharge status: Home / Self Care | Payer: Commercial Managed Care - PPO | Source: Ambulatory Visit

## 2023-08-05 DIAGNOSIS — Z1231 Encounter for screening mammogram for malignant neoplasm of breast: Secondary | ICD-10-CM | POA: Diagnosis not present

## 2023-08-21 ENCOUNTER — Telehealth: Payer: Commercial Managed Care - PPO | Admitting: Emergency Medicine

## 2023-08-21 ENCOUNTER — Other Ambulatory Visit (HOSPITAL_BASED_OUTPATIENT_CLINIC_OR_DEPARTMENT_OTHER): Payer: Self-pay

## 2023-08-21 DIAGNOSIS — M549 Dorsalgia, unspecified: Secondary | ICD-10-CM | POA: Diagnosis not present

## 2023-08-21 MED ORDER — CYCLOBENZAPRINE HCL 10 MG PO TABS
10.0000 mg | ORAL_TABLET | Freq: Three times a day (TID) | ORAL | 0 refills | Status: AC | PRN
  Filled 2023-08-21: qty 10, 4d supply, fill #0

## 2023-08-21 MED ORDER — PREDNISONE 20 MG PO TABS
40.0000 mg | ORAL_TABLET | Freq: Every day | ORAL | 0 refills | Status: DC
  Filled 2023-08-21: qty 10, 5d supply, fill #0

## 2023-08-21 NOTE — Progress Notes (Signed)

## 2023-09-21 ENCOUNTER — Other Ambulatory Visit (HOSPITAL_BASED_OUTPATIENT_CLINIC_OR_DEPARTMENT_OTHER): Payer: Self-pay

## 2023-10-09 ENCOUNTER — Other Ambulatory Visit: Payer: 59

## 2023-10-15 ENCOUNTER — Encounter: Payer: 59 | Admitting: Family Medicine

## 2023-10-22 ENCOUNTER — Encounter: Payer: Self-pay | Admitting: Family Medicine

## 2023-10-22 ENCOUNTER — Ambulatory Visit: Admitting: Family Medicine

## 2023-10-22 ENCOUNTER — Other Ambulatory Visit (HOSPITAL_BASED_OUTPATIENT_CLINIC_OR_DEPARTMENT_OTHER): Payer: Self-pay

## 2023-10-22 VITALS — BP 130/76 | HR 75 | Temp 97.9°F | Ht 68.0 in

## 2023-10-22 DIAGNOSIS — Z0000 Encounter for general adult medical examination without abnormal findings: Secondary | ICD-10-CM | POA: Diagnosis not present

## 2023-10-22 DIAGNOSIS — Z794 Long term (current) use of insulin: Secondary | ICD-10-CM | POA: Diagnosis not present

## 2023-10-22 DIAGNOSIS — E039 Hypothyroidism, unspecified: Secondary | ICD-10-CM | POA: Diagnosis not present

## 2023-10-22 DIAGNOSIS — E782 Mixed hyperlipidemia: Secondary | ICD-10-CM

## 2023-10-22 DIAGNOSIS — Z0001 Encounter for general adult medical examination with abnormal findings: Secondary | ICD-10-CM

## 2023-10-22 DIAGNOSIS — E119 Type 2 diabetes mellitus without complications: Secondary | ICD-10-CM

## 2023-10-22 DIAGNOSIS — Z1211 Encounter for screening for malignant neoplasm of colon: Secondary | ICD-10-CM | POA: Diagnosis not present

## 2023-10-22 DIAGNOSIS — E1169 Type 2 diabetes mellitus with other specified complication: Secondary | ICD-10-CM

## 2023-10-22 MED ORDER — LEVOTHYROXINE SODIUM 150 MCG PO TABS
150.0000 ug | ORAL_TABLET | Freq: Every day | ORAL | 3 refills | Status: DC
  Filled 2023-10-22: qty 90, 90d supply, fill #0

## 2023-10-22 NOTE — Progress Notes (Signed)
 Subjective:    Patient ID: Grace Hawkins, female    DOB: [birth year], 48 y.o.   MRN: [id number]  HPI Patient is a very sweet 48 year old Caucasian female who is here today for physical exam.  She receives her mammogram and her Pap smear through her gynecologist.  Patient has lost more than 80 pounds on Mounjaro .  She is going to the gym frequently.  Very proud of her for this.  Her blood pressure today is excellent at 130/76.  Her most recent lab work showed a hemoglobin A1c of 4.9.  However the patient would like to continue taking the Mounjaro  as it is facilitated significant weight loss.  Her most recent TSH however was 0.39.  She is still taking 175 mcg of levothyroxine  a day.  She never reduce the dose to 150.  Therefore we need to recheck that today.  Her CBC and CMP from January were outstanding.  She is due for the pneumonia vaccine but she politely declines this.  She gets her Pap smear through her gynecologist.  Her mammogram was performed in February and was normal.  She is due for a colonoscopy and she would like me to schedule this for her. Past Medical History:  Diagnosis Date   Allergy    Elevated liver function tests 11/23/2013   Hypothyroid    Pyelonephritis 11/23/2013   No past surgical history on file. Current Outpatient Medications on File Prior to Visit  Medication Sig Dispense Refill   Blood Glucose Monitoring Suppl KIT Use to check blood sugar up to 3 times daily ICD 10: R73.09, Dispense based on insurance preference (Patient not taking: Reported on 07/11/2021) 1 each 0   cetirizine  (ZYRTEC ) 10 MG tablet Take 1 tablet (10 mg total) by mouth daily as needed for allergies. 30 tablet 11   cyclobenzaprine  (FLEXERIL ) 10 MG tablet Take 1 tablet (10 mg total) by mouth 3 (three) times daily as needed for muscle spasms. 10 tablet 0   levothyroxine  (SYNTHROID ) 175 MCG tablet Take 1 tablet (175 mcg total) by mouth daily. 90 tablet 3   predniSONE  (DELTASONE ) 20 MG tablet Take 2 tablets  (40 mg total) by mouth daily. 10 tablet 0   tirzepatide  (MOUNJARO ) 15 MG/0.5ML Pen Inject 15 mg into the skin once a week. 6 mL 1   triamcinolone  cream (KENALOG ) 0.1 % Apply 1 Application topically 2 (two) times daily. 30 g 0   No current facility-administered medications on file prior to visit.   Aaron Aasall Social History   Socioeconomic History   Marital status: Married    Spouse name: Not on file   Number of children: Not on file   Years of education: Not on file   Highest education level: Not on file  Occupational History   Not on file  Tobacco Use   Smoking status: Never   Smokeless tobacco: Never  Substance and Sexual Activity   Alcohol use: No   Drug use: No   Sexual activity: Not on file  Other Topics Concern   Not on file  Social History Narrative   Not on file   Social Drivers of Health   Financial Resource Strain: Not on file  Food Insecurity: Not on file  Transportation Needs: Not on file  Physical Activity: Not on file  Stress: Not on file  Social Connections: Not on file  Intimate Partner Violence: Not on file      Review of Systems     Objective:   Physical Exam Vitals  reviewed.  Constitutional:      General: She is not in acute distress.    Appearance: Normal appearance. She is obese. She is not ill-appearing or toxic-appearing.  HENT:     Head: Normocephalic and atraumatic.     Right Ear: Tympanic membrane and ear canal normal.     Left Ear: Tympanic membrane and ear canal normal.     Nose: Nose normal. No congestion or rhinorrhea.     Mouth/Throat:     Mouth: Mucous membranes are moist.     Pharynx: Oropharynx is clear. No oropharyngeal exudate or posterior oropharyngeal erythema.  Eyes:     General: No scleral icterus.       Right eye: No discharge.        Left eye: No discharge.     Extraocular Movements: Extraocular movements intact.     Conjunctiva/sclera: Conjunctivae normal.     Pupils: Pupils are equal, round, and reactive to light.   Neck:     Vascular: No carotid bruit.  Cardiovascular:     Rate and Rhythm: Normal rate and regular rhythm.     Pulses: Normal pulses.     Heart sounds: Normal heart sounds. No murmur heard.    No friction rub. No gallop.  Pulmonary:     Effort: Pulmonary effort is normal. No respiratory distress.     Breath sounds: Normal breath sounds. No stridor. No wheezing, rhonchi or rales.  Chest:     Chest wall: No tenderness.  Abdominal:     General: Abdomen is flat. Bowel sounds are normal. There is no distension.     Palpations: Abdomen is soft.     Tenderness: There is no abdominal tenderness. There is no guarding or rebound.     Hernia: No hernia is present.  Musculoskeletal:     Cervical back: No rigidity.     Right lower leg: No edema.     Left lower leg: No edema.  Lymphadenopathy:     Cervical: No cervical adenopathy.  Skin:    Findings: No erythema or rash.  Neurological:     General: No focal deficit present.     Mental Status: She is alert and oriented to person, place, and time. Mental status is at baseline.     Cranial Nerves: No cranial nerve deficit.     Sensory: No sensory deficit.     Motor: No weakness.     Coordination: Coordination normal.     Gait: Gait normal.  Psychiatric:        Mood and Affect: Mood normal.        Behavior: Behavior normal.        Thought Content: Thought content normal.        Judgment: Judgment normal.           Assessment & Plan:   General medical exam - Plan: CBC with Differential/Platelet, COMPLETE METABOLIC PANEL WITHOUT GFR, Lipid panel, Hemoglobin A1c, TSH, Microalbumin/Creatinine Ratio, Urine  Hyperlipidemia, mixed - Plan: CBC with Differential/Platelet, COMPLETE METABOLIC PANEL WITHOUT GFR, Lipid panel, Hemoglobin A1c, TSH, Microalbumin/Creatinine Ratio, Urine  Type 2 diabetes mellitus without complication, with long-term current use of insulin  (HCC) - Plan: CBC with Differential/Platelet, COMPLETE METABOLIC PANEL  WITHOUT GFR, Lipid panel, Hemoglobin A1c, TSH, Microalbumin/Creatinine Ratio, Urine  Hypothyroidism, unspecified type - Plan: CBC with Differential/Platelet, COMPLETE METABOLIC PANEL WITHOUT GFR, Lipid panel, Hemoglobin A1c, TSH, Microalbumin/Creatinine Ratio, Urine  Colon cancer screening - Plan: Ambulatory referral to Gastroenterology I am very proud of the  patient for losing 80 pounds.  Continue Mounjaro  15 subcu weekly.  Blood pressure is excellent.  We will reduce the dose of levothyroxine  to 150 mcg daily to achieve a TSH within the therapeutic window.  Check fasting lipid panel.  Check urine protein creatinine ratio.  Patient declines pneumonia vaccine.  Diabetic foot exam was performed today and was significant for evidence of Raynaud's phenomenon but was otherwise normal.  Patient denies any claudication or pain in her feet with activity.  She states that this seldom happens.

## 2023-10-23 LAB — CBC WITH DIFFERENTIAL/PLATELET
Absolute Lymphocytes: 1991 {cells}/uL (ref 850–3900)
Absolute Monocytes: 561 {cells}/uL (ref 200–950)
Basophils Absolute: 79 {cells}/uL (ref 0–200)
Basophils Relative: 1 %
Eosinophils Absolute: 292 {cells}/uL (ref 15–500)
Eosinophils Relative: 3.7 %
HCT: 40.5 % (ref 35.0–45.0)
Hemoglobin: 13.1 g/dL (ref 11.7–15.5)
MCH: 29.6 pg (ref 27.0–33.0)
MCHC: 32.3 g/dL (ref 32.0–36.0)
MCV: 91.6 fL (ref 80.0–100.0)
MPV: 12.1 fL (ref 7.5–12.5)
Monocytes Relative: 7.1 %
Neutro Abs: 4977 {cells}/uL (ref 1500–7800)
Neutrophils Relative %: 63 %
Platelets: 227 10*3/uL (ref 140–400)
RBC: 4.42 10*6/uL (ref 3.80–5.10)
RDW: 13.6 % (ref 11.0–15.0)
Total Lymphocyte: 25.2 %
WBC: 7.9 10*3/uL (ref 3.8–10.8)

## 2023-10-23 LAB — COMPLETE METABOLIC PANEL WITHOUT GFR
AG Ratio: 1.7 (calc) (ref 1.0–2.5)
ALT: 13 U/L (ref 6–29)
AST: 15 U/L (ref 10–35)
Albumin: 4.2 g/dL (ref 3.6–5.1)
Alkaline phosphatase (APISO): 39 U/L (ref 31–125)
BUN: 10 mg/dL (ref 7–25)
CO2: 24 mmol/L (ref 20–32)
Calcium: 9.2 mg/dL (ref 8.6–10.2)
Chloride: 105 mmol/L (ref 98–110)
Creat: 0.8 mg/dL (ref 0.50–0.99)
Globulin: 2.5 g/dL (ref 1.9–3.7)
Glucose, Bld: 83 mg/dL (ref 65–99)
Potassium: 3.7 mmol/L (ref 3.5–5.3)
Sodium: 141 mmol/L (ref 135–146)
Total Bilirubin: 0.6 mg/dL (ref 0.2–1.2)
Total Protein: 6.7 g/dL (ref 6.1–8.1)

## 2023-10-23 LAB — HEMOGLOBIN A1C
Hgb A1c MFr Bld: 4.7 % (ref <5.7)
Mean Plasma Glucose: 88 mg/dL
eAG (mmol/L): 4.9 mmol/L

## 2023-10-23 LAB — MICROALBUMIN / CREATININE URINE RATIO
Creatinine, Urine: 205 mg/dL (ref 20–275)
Microalb Creat Ratio: 9 mg/g{creat} (ref <30)
Microalb, Ur: 1.9 mg/dL

## 2023-10-23 LAB — LIPID PANEL
Cholesterol: 168 mg/dL (ref <200)
HDL: 62 mg/dL (ref >=50)
LDL Cholesterol (Calc): 88 mg/dL
Non-HDL Cholesterol (Calc): 106 mg/dL (ref <130)
Total CHOL/HDL Ratio: 2.7 (calc) (ref <5.0)
Triglycerides: 88 mg/dL (ref <150)

## 2023-10-23 LAB — TSH: TSH: 5.33 m[IU]/L — ABNORMAL HIGH

## 2023-10-28 ENCOUNTER — Encounter (INDEPENDENT_AMBULATORY_CARE_PROVIDER_SITE_OTHER): Payer: Self-pay | Admitting: *Deleted

## 2023-11-02 ENCOUNTER — Other Ambulatory Visit (HOSPITAL_BASED_OUTPATIENT_CLINIC_OR_DEPARTMENT_OTHER): Payer: Self-pay

## 2023-12-03 ENCOUNTER — Other Ambulatory Visit: Payer: Self-pay | Admitting: Family Medicine

## 2023-12-03 ENCOUNTER — Encounter: Payer: Self-pay | Admitting: Family Medicine

## 2023-12-03 ENCOUNTER — Other Ambulatory Visit (HOSPITAL_BASED_OUTPATIENT_CLINIC_OR_DEPARTMENT_OTHER): Payer: Self-pay

## 2023-12-03 DIAGNOSIS — Z794 Long term (current) use of insulin: Secondary | ICD-10-CM

## 2023-12-03 DIAGNOSIS — E782 Mixed hyperlipidemia: Secondary | ICD-10-CM

## 2023-12-04 ENCOUNTER — Other Ambulatory Visit: Payer: Self-pay | Admitting: Family Medicine

## 2023-12-04 ENCOUNTER — Other Ambulatory Visit (HOSPITAL_BASED_OUTPATIENT_CLINIC_OR_DEPARTMENT_OTHER): Payer: Self-pay

## 2023-12-04 MED ORDER — MOUNJARO 15 MG/0.5ML ~~LOC~~ SOAJ
15.0000 mg | SUBCUTANEOUS | 1 refills | Status: DC
  Filled 2023-12-11 – 2023-12-15 (×2): qty 6, 84d supply, fill #0
  Filled 2024-03-05: qty 6, 84d supply, fill #1

## 2023-12-04 MED ORDER — LEVOTHYROXINE SODIUM 175 MCG PO TABS
175.0000 ug | ORAL_TABLET | Freq: Every day | ORAL | 3 refills | Status: AC
  Filled 2023-12-04: qty 90, 90d supply, fill #0
  Filled 2024-03-05: qty 90, 90d supply, fill #1
  Filled 2024-07-21: qty 90, 90d supply, fill #2

## 2023-12-11 ENCOUNTER — Other Ambulatory Visit (HOSPITAL_BASED_OUTPATIENT_CLINIC_OR_DEPARTMENT_OTHER): Payer: Self-pay

## 2023-12-15 ENCOUNTER — Other Ambulatory Visit (HOSPITAL_BASED_OUTPATIENT_CLINIC_OR_DEPARTMENT_OTHER): Payer: Self-pay

## 2024-01-05 ENCOUNTER — Other Ambulatory Visit (HOSPITAL_COMMUNITY): Payer: Self-pay

## 2024-03-07 ENCOUNTER — Other Ambulatory Visit: Payer: Self-pay

## 2024-03-23 ENCOUNTER — Ambulatory Visit (HOSPITAL_COMMUNITY)
Admission: RE | Admit: 2024-03-23 | Discharge: 2024-03-23 | Disposition: A | Discharge status: Home / Self Care | Source: Ambulatory Visit | Attending: Obstetrics and Gynecology | Admitting: Obstetrics and Gynecology

## 2024-03-23 ENCOUNTER — Other Ambulatory Visit (HOSPITAL_BASED_OUTPATIENT_CLINIC_OR_DEPARTMENT_OTHER): Payer: Self-pay

## 2024-03-23 ENCOUNTER — Other Ambulatory Visit (HOSPITAL_COMMUNITY): Payer: Self-pay | Admitting: Obstetrics and Gynecology

## 2024-03-23 DIAGNOSIS — B9689 Other specified bacterial agents as the cause of diseases classified elsewhere: Secondary | ICD-10-CM | POA: Diagnosis not present

## 2024-03-23 DIAGNOSIS — R935 Abnormal findings on diagnostic imaging of other abdominal regions, including retroperitoneum: Secondary | ICD-10-CM | POA: Diagnosis not present

## 2024-03-23 DIAGNOSIS — R102 Pelvic and perineal pain: Secondary | ICD-10-CM | POA: Diagnosis not present

## 2024-03-23 DIAGNOSIS — N760 Acute vaginitis: Secondary | ICD-10-CM | POA: Diagnosis not present

## 2024-03-23 MED ORDER — IOHEXOL 350 MG/ML SOLN
75.0000 mL | Freq: Once | INTRAVENOUS | Status: AC | PRN
  Administered 2024-03-23: 75 mL via INTRAVENOUS

## 2024-03-23 MED ORDER — METRONIDAZOLE 500 MG PO TABS
500.0000 mg | ORAL_TABLET | Freq: Two times a day (BID) | ORAL | 0 refills | Status: AC
  Filled 2024-03-23: qty 14, 7d supply, fill #0

## 2024-03-24 ENCOUNTER — Encounter: Payer: Self-pay | Admitting: Family Medicine

## 2024-03-24 ENCOUNTER — Encounter (HOSPITAL_COMMUNITY): Payer: Self-pay | Admitting: Obstetrics and Gynecology

## 2024-03-25 ENCOUNTER — Other Ambulatory Visit: Payer: Self-pay | Admitting: Family Medicine

## 2024-03-25 DIAGNOSIS — E279 Disorder of adrenal gland, unspecified: Secondary | ICD-10-CM

## 2024-03-30 ENCOUNTER — Ambulatory Visit (HOSPITAL_COMMUNITY)
Admission: RE | Admit: 2024-03-30 | Discharge: 2024-03-30 | Disposition: A | Discharge status: Home / Self Care | Source: Ambulatory Visit | Attending: Family Medicine | Admitting: Family Medicine

## 2024-03-30 DIAGNOSIS — E279 Disorder of adrenal gland, unspecified: Secondary | ICD-10-CM | POA: Diagnosis not present

## 2024-03-30 DIAGNOSIS — E278 Other specified disorders of adrenal gland: Secondary | ICD-10-CM | POA: Diagnosis not present

## 2024-03-30 MED ORDER — GADOBUTROL 1 MMOL/ML IV SOLN: 10.0000 mL | Freq: Once | INTRAVENOUS | Status: DC | PRN

## 2024-03-30 MED ORDER — GADOBUTROL 1 MMOL/ML IV SOLN
10.0000 mL | Freq: Once | INTRAVENOUS | Status: AC | PRN
  Administered 2024-03-30: 10 mL via INTRAVENOUS

## 2024-03-31 ENCOUNTER — Ambulatory Visit: Payer: Self-pay | Admitting: Family Medicine

## 2024-04-25 ENCOUNTER — Other Ambulatory Visit: Payer: Self-pay | Admitting: Family Medicine

## 2024-04-25 DIAGNOSIS — E782 Mixed hyperlipidemia: Secondary | ICD-10-CM

## 2024-04-25 DIAGNOSIS — E119 Type 2 diabetes mellitus without complications: Secondary | ICD-10-CM

## 2024-04-26 ENCOUNTER — Other Ambulatory Visit (HOSPITAL_COMMUNITY): Payer: Self-pay

## 2024-04-26 MED ORDER — MOUNJARO 15 MG/0.5ML ~~LOC~~ SOAJ
15.0000 mg | SUBCUTANEOUS | 1 refills | Status: AC
  Filled 2024-04-26 – 2024-05-29 (×2): qty 6, 84d supply, fill #0

## 2024-04-28 ENCOUNTER — Other Ambulatory Visit (HOSPITAL_BASED_OUTPATIENT_CLINIC_OR_DEPARTMENT_OTHER): Payer: Self-pay

## 2024-04-28 MED ORDER — BISACODYL EC 5 MG PO TBEC
20.0000 mg | DELAYED_RELEASE_TABLET | ORAL | 1 refills | Status: AC
  Filled 2024-04-28: qty 4, 1d supply, fill #0

## 2024-04-28 MED ORDER — PEG 3350-KCL-NA BICARB-NACL 420 G PO SOLR
ORAL | 0 refills | Status: AC
  Filled 2024-04-28: qty 4000, 1d supply, fill #0

## 2024-05-02 ENCOUNTER — Encounter (INDEPENDENT_AMBULATORY_CARE_PROVIDER_SITE_OTHER): Payer: Self-pay | Admitting: *Deleted

## 2024-05-03 DIAGNOSIS — K644 Residual hemorrhoidal skin tags: Secondary | ICD-10-CM | POA: Diagnosis not present

## 2024-05-03 DIAGNOSIS — K635 Polyp of colon: Secondary | ICD-10-CM | POA: Diagnosis not present

## 2024-05-03 DIAGNOSIS — Z1211 Encounter for screening for malignant neoplasm of colon: Secondary | ICD-10-CM | POA: Diagnosis not present

## 2024-05-03 DIAGNOSIS — K648 Other hemorrhoids: Secondary | ICD-10-CM | POA: Diagnosis not present

## 2024-05-30 ENCOUNTER — Other Ambulatory Visit: Payer: Self-pay

## 2024-05-30 ENCOUNTER — Other Ambulatory Visit (HOSPITAL_COMMUNITY): Payer: Self-pay

## 2024-05-31 ENCOUNTER — Other Ambulatory Visit (HOSPITAL_COMMUNITY): Payer: Self-pay

## 2024-07-07 ENCOUNTER — Other Ambulatory Visit: Payer: Self-pay | Admitting: Family Medicine

## 2024-07-22 ENCOUNTER — Other Ambulatory Visit: Payer: Self-pay

## 2024-07-26 ENCOUNTER — Other Ambulatory Visit: Payer: Self-pay

## 2024-07-27 ENCOUNTER — Telehealth: Payer: Self-pay

## 2024-07-27 ENCOUNTER — Other Ambulatory Visit: Payer: Self-pay

## 2024-07-27 ENCOUNTER — Other Ambulatory Visit (HOSPITAL_BASED_OUTPATIENT_CLINIC_OR_DEPARTMENT_OTHER): Payer: Self-pay

## 2024-07-27 ENCOUNTER — Other Ambulatory Visit (HOSPITAL_COMMUNITY): Payer: Self-pay

## 2024-07-27 NOTE — Telephone Encounter (Signed)
 Copied from CRM #[id number]. Topic: Clinical - Medication Question >> Jul 27, 2024  9:33 AM Larissa RAMAN wrote: Reason for CRM: [facility] states previous manufacturer for levothyroxine  (SYNTHROID ) 175 MCG tablet is no longer available, so they will have to use a different manufacturer for medication.   Callback# [phone]

## 2024-08-02 ENCOUNTER — Other Ambulatory Visit (HOSPITAL_BASED_OUTPATIENT_CLINIC_OR_DEPARTMENT_OTHER): Payer: Self-pay

## 2024-10-27 ENCOUNTER — Encounter: Admitting: Family Medicine
